# Patient Record
Sex: Female | Born: 1950 | Race: White | Hispanic: No
Health system: Southern US, Academic
[De-identification: ages and names within clinical notes are randomized; demographics above are authoritative.]

## PROBLEM LIST (undated history)

## (undated) ENCOUNTER — Encounter

## (undated) ENCOUNTER — Ambulatory Visit

## (undated) ENCOUNTER — Ambulatory Visit: Attending: Internal Medicine | Primary: Internal Medicine

## (undated) ENCOUNTER — Telehealth

## (undated) ENCOUNTER — Encounter: Payer: MEDICARE | Attending: Obstetrics & Gynecology | Primary: Obstetrics & Gynecology

## (undated) ENCOUNTER — Telehealth: Attending: Obstetrics & Gynecology | Primary: Obstetrics & Gynecology

## (undated) ENCOUNTER — Encounter: Payer: MEDICARE | Attending: Nutritionist | Primary: Nutritionist

## (undated) DIAGNOSIS — E079 Disorder of thyroid, unspecified: Secondary | ICD-10-CM

## (undated) DIAGNOSIS — I10 Essential (primary) hypertension: Secondary | ICD-10-CM

## (undated) DIAGNOSIS — M810 Age-related osteoporosis without current pathological fracture: Secondary | ICD-10-CM

## (undated) DIAGNOSIS — M546 Pain in thoracic spine: Secondary | ICD-10-CM

## (undated) DIAGNOSIS — I219 Acute myocardial infarction, unspecified: Secondary | ICD-10-CM

## (undated) DIAGNOSIS — I509 Heart failure, unspecified: Secondary | ICD-10-CM

## (undated) DIAGNOSIS — M1990 Unspecified osteoarthritis, unspecified site: Secondary | ICD-10-CM

## (undated) HISTORY — DX: Age-related osteoporosis without current pathological fracture: M81.0

## (undated) HISTORY — DX: Heart failure, unspecified: I50.9

## (undated) HISTORY — DX: Unspecified osteoarthritis, unspecified site: M19.90

## (undated) HISTORY — DX: Disorder of thyroid, unspecified: E07.9

## (undated) HISTORY — DX: Acute myocardial infarction, unspecified: I21.9

## (undated) HISTORY — DX: Pain in thoracic spine: M54.6

## (undated) HISTORY — DX: Essential (primary) hypertension: I10

---
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
## 1987-08-27 LAB — HM COLONOSCOPY: HM Colonoscopy: NORMAL

## 1999-08-27 HISTORY — PX: LAPAROSCOPIC HYSTERECTOMY: SHX1926

## 1999-11-04 ENCOUNTER — Encounter: Payer: Self-pay | Admitting: Obstetrics and Gynecology

## 1999-11-04 ENCOUNTER — Inpatient Hospital Stay (HOSPITAL_COMMUNITY): Admission: AD | Admit: 1999-11-04 | Discharge: 1999-11-04 | Payer: Self-pay | Admitting: Obstetrics and Gynecology

## 1999-11-10 ENCOUNTER — Ambulatory Visit (HOSPITAL_COMMUNITY): Admission: RE | Admit: 1999-11-10 | Discharge: 1999-11-10 | Payer: Self-pay | Admitting: Obstetrics and Gynecology

## 2000-02-25 ENCOUNTER — Other Ambulatory Visit: Admission: RE | Admit: 2000-02-25 | Discharge: 2000-02-25 | Payer: Self-pay | Admitting: Obstetrics and Gynecology

## 2002-08-26 LAB — HM MAMMOGRAPHY: HM Mammogram: NORMAL

## 2006-05-15 ENCOUNTER — Ambulatory Visit: Payer: Self-pay | Admitting: *Deleted

## 2006-05-19 ENCOUNTER — Ambulatory Visit: Payer: Self-pay | Admitting: *Deleted

## 2006-06-17 ENCOUNTER — Ambulatory Visit: Payer: Self-pay | Admitting: *Deleted

## 2007-02-25 ENCOUNTER — Emergency Department: Payer: Self-pay

## 2007-12-01 ENCOUNTER — Ambulatory Visit: Payer: Self-pay

## 2007-12-07 ENCOUNTER — Ambulatory Visit: Payer: Self-pay

## 2013-08-07 ENCOUNTER — Emergency Department: Payer: Self-pay | Admitting: Emergency Medicine

## 2013-08-07 LAB — BASIC METABOLIC PANEL
Anion Gap: 4 — ABNORMAL LOW (ref 7–16)
BUN: 11 mg/dL (ref 7–18)
Calcium, Total: 9.4 mg/dL (ref 8.5–10.1)
Chloride: 105 mmol/L (ref 98–107)
Co2: 29 mmol/L (ref 21–32)
Creatinine: 0.78 mg/dL (ref 0.60–1.30)
EGFR (African American): >60
EGFR (Non-African Amer.): >60
Glucose: 93 mg/dL (ref 65–99)
Osmolality: 275 (ref 275–301)
Potassium: 3.9 mmol/L (ref 3.5–5.1)
Sodium: 138 mmol/L (ref 136–145)

## 2013-08-07 LAB — CBC
HCT: 39.3 % (ref 35.0–47.0)
HGB: 13.4 g/dL (ref 12.0–16.0)
MCH: 29.1 pg (ref 26.0–34.0)
MCHC: 34 g/dL (ref 32.0–36.0)
MCV: 86 fL (ref 80–100)
Platelet: 285 10*3/uL (ref 150–440)
RBC: 4.6 10*6/uL (ref 3.80–5.20)
RDW: 13.8 % (ref 11.5–14.5)
WBC: 6.9 10*3/uL (ref 3.6–11.0)

## 2013-08-07 LAB — HEPATIC FUNCTION PANEL A (ARMC)
Albumin: 4.3 g/dL (ref 3.4–5.0)
Alkaline Phosphatase: 85 U/L
Bilirubin, Direct: <0.1 mg/dL (ref 0.00–0.20)
Bilirubin,Total: 0.5 mg/dL (ref 0.2–1.0)
SGOT(AST): 21 U/L (ref 15–37)
SGPT (ALT): 21 U/L (ref 12–78)
Total Protein: 8.4 g/dL — ABNORMAL HIGH (ref 6.4–8.2)

## 2013-08-07 LAB — PRO B NATRIURETIC PEPTIDE: B-Type Natriuretic Peptide: 209 pg/mL — ABNORMAL HIGH (ref 0–125)

## 2013-08-07 LAB — TROPONIN I: Troponin-I: <0.02 ng/mL

## 2013-08-07 LAB — LIPASE, BLOOD: Lipase: 312 U/L (ref 73–393)

## 2013-08-11 ENCOUNTER — Ambulatory Visit: Payer: Self-pay | Admitting: Internal Medicine

## 2013-08-11 LAB — CBC WITH DIFFERENTIAL/PLATELET
Basophil #: 0.1 10*3/uL (ref 0.0–0.1)
Basophil %: 0.8 %
Eosinophil #: 0.2 10*3/uL (ref 0.0–0.7)
Eosinophil %: 3.2 %
HCT: 39.6 % (ref 35.0–47.0)
HGB: 13.3 g/dL (ref 12.0–16.0)
Lymphocyte #: 1.2 10*3/uL (ref 1.0–3.6)
Lymphocyte %: 19.8 %
MCH: 28.8 pg (ref 26.0–34.0)
MCHC: 33.7 g/dL (ref 32.0–36.0)
MCV: 85 fL (ref 80–100)
Monocyte #: 0.4 x10 3/mm (ref 0.2–0.9)
Monocyte %: 7.3 %
Neutrophil #: 4.1 10*3/uL (ref 1.4–6.5)
Neutrophil %: 68.9 %
Platelet: 257 10*3/uL (ref 150–440)
RBC: 4.64 10*6/uL (ref 3.80–5.20)
RDW: 13.7 % (ref 11.5–14.5)
WBC: 6 10*3/uL (ref 3.6–11.0)

## 2013-08-11 LAB — BASIC METABOLIC PANEL
Anion Gap: 3 — ABNORMAL LOW (ref 7–16)
BUN: 9 mg/dL (ref 7–18)
Calcium, Total: 9.4 mg/dL (ref 8.5–10.1)
Chloride: 106 mmol/L (ref 98–107)
Co2: 30 mmol/L (ref 21–32)
Creatinine: 0.7 mg/dL (ref 0.60–1.30)
EGFR (African American): >60
EGFR (Non-African Amer.): >60
Glucose: 92 mg/dL (ref 65–99)
Osmolality: 276 (ref 275–301)
Potassium: 3.9 mmol/L (ref 3.5–5.1)
Sodium: 139 mmol/L (ref 136–145)

## 2013-08-24 ENCOUNTER — Ambulatory Visit: Payer: Self-pay | Admitting: Internal Medicine

## 2014-04-12 ENCOUNTER — Ambulatory Visit: Payer: Self-pay | Admitting: Internal Medicine

## 2014-05-30 DIAGNOSIS — N819 Female genital prolapse, unspecified: Secondary | ICD-10-CM | POA: Insufficient documentation

## 2014-05-30 DIAGNOSIS — N8110 Cystocele, unspecified: Secondary | ICD-10-CM | POA: Insufficient documentation

## 2014-07-12 DIAGNOSIS — I5022 Chronic systolic (congestive) heart failure: Secondary | ICD-10-CM | POA: Insufficient documentation

## 2014-10-31 LAB — TSH: TSH: 4.48 u[IU]/mL (ref <=5.90)

## 2014-10-31 LAB — BASIC METABOLIC PANEL
BUN: 10 mg/dL (ref 4–21)
Creatinine: 0.7 mg/dL (ref <=1.1)

## 2014-11-09 ENCOUNTER — Ambulatory Visit: Payer: Self-pay | Admitting: Internal Medicine

## 2014-12-14 DIAGNOSIS — M359 Systemic involvement of connective tissue, unspecified: Secondary | ICD-10-CM | POA: Insufficient documentation

## 2015-01-11 DIAGNOSIS — M069 Rheumatoid arthritis, unspecified: Secondary | ICD-10-CM | POA: Insufficient documentation

## 2015-01-11 DIAGNOSIS — R0789 Other chest pain: Secondary | ICD-10-CM | POA: Insufficient documentation

## 2015-01-11 DIAGNOSIS — I10 Essential (primary) hypertension: Secondary | ICD-10-CM | POA: Insufficient documentation

## 2015-01-27 ENCOUNTER — Encounter: Payer: Self-pay | Admitting: Internal Medicine

## 2015-01-27 ENCOUNTER — Ambulatory Visit (INDEPENDENT_AMBULATORY_CARE_PROVIDER_SITE_OTHER): Payer: BLUE CROSS/BLUE SHIELD | Admitting: Internal Medicine

## 2015-01-27 ENCOUNTER — Other Ambulatory Visit: Payer: Self-pay | Admitting: Rheumatology

## 2015-01-27 VITALS — BP 152/82 | HR 76 | Ht 62.5 in | Wt 113.5 lb

## 2015-01-27 DIAGNOSIS — L209 Atopic dermatitis, unspecified: Secondary | ICD-10-CM | POA: Diagnosis not present

## 2015-01-27 DIAGNOSIS — E785 Hyperlipidemia, unspecified: Secondary | ICD-10-CM | POA: Insufficient documentation

## 2015-01-27 DIAGNOSIS — I7300 Raynaud's syndrome without gangrene: Secondary | ICD-10-CM | POA: Insufficient documentation

## 2015-01-27 DIAGNOSIS — R1310 Dysphagia, unspecified: Secondary | ICD-10-CM

## 2015-01-27 MED ORDER — CETIRIZINE HCL 10 MG PO TABS: 10.0000 mg | ORAL_TABLET | Freq: Every day | ORAL | Status: DC

## 2015-01-27 MED ORDER — RANITIDINE HCL 150 MG PO TABS: 150.0000 mg | ORAL_TABLET | Freq: Two times a day (BID) | ORAL | Status: DC

## 2015-01-27 NOTE — Progress Notes (Signed)
Date:  01/27/2015   Name:  Katherine Baird   DOB:  10/08/1950   MRN:  [id number]   Chief Complaint: Rash  History of Present Illness:  This is a 64 y.o. female who is presenting with a rash.  The problem started as swelling and redness in right foot and leg.  The redness moved to left leg and foot and last evening moved to face.  The rash is itchy and painful.  Today she has symptoms on both arms as well.  She has known atopic dermatitis and rheumatoid arthritis.  She has TAC cream to use as needed.  Review of Systems:  Review of Systems  Respiratory: Negative for shortness of breath and wheezing.   Cardiovascular: Positive for leg swelling. Negative for chest pain.  Skin: Positive for color change and rash.       And nail changes    Patient Active Problem List   Diagnosis Date Noted  . HLD (hyperlipidemia) 01/27/2015  . Paroxysmal digital cyanosis 01/27/2015  . 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 Acquired hypothyroidism 01/11/2015  . AD (atopic dermatitis) 01/11/2015  . Chest wall pain 01/11/2015  . 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 Essential (primary) hypertension 01/11/2015  . Arthritis, degenerative 01/11/2015  . Rheumatoid arthritis involving multiple joints 01/11/2015  . Collagen disease 12/14/2014  . Chronic systolic heart failure 07/12/2014  . 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 Authorizing Provider  carvedilol (COREG) 6.25 MG tablet Take 1 tablet by mouth 2 (two) times daily.   Yes Historical Provider, MD  levothyroxine (SYNTHROID, LEVOTHROID) 25 MCG tablet Take 1 tablet by mouth daily.   Yes Historical Provider, MD  pseudoephedrine (SUDAFED) 120 MG 12 hr tablet Take 120 tablets by mouth once as needed. 02/16/08  Yes Historical Provider, MD    Allergies  Allergen Reactions  . Doxycycline   . Erythromycin   .  Gabapentin   . Guaifenesin     "Felt sick" and worsening of symptoms  . Nitrofuran Derivatives   . Prednisone     muscle weakness/pain  . Sulfa Antibiotics   . Tetracycline   . Azithromycin Rash    Severe rash with peeling    Past Surgical History  Procedure Laterality Date  . Laparoscopic hysterectomy  2001    History  Substance Use Topics  . Smoking status: Never Smoker   . Smokeless tobacco: Not on file  . Alcohol Use: No     Medication list has been reviewed and updated.  Physical Examination:  Physical Exam  Constitutional: She appears well-developed and well-nourished. No distress.  Cardiovascular: Normal rate.   Pulmonary/Chest: Effort normal and breath sounds normal. She has no wheezes. She has no rales.  Skin: Skin is warm and dry. Rash noted. Rash is urticarial. There is erythema.  Red macular lesions scattered over both legs, arms and face c/w atopic dermatitis.    BP 152/82 mmHg  Pulse 76  Ht 5' 2.5" (1.588 m)  Wt 113 lb 8 oz (51.483 kg)  BMI 20.42 kg/m2  Assessment and Plan: 1. AD (atopic dermatitis) Severe - possible related to RA Begin Zantac and Zyrtec daily Pt intolerant to oral prednisone Needs to establish with another Dermatologist   Bari Edward, MD [facility]  01/27/2015

## 2015-02-06 ENCOUNTER — Ambulatory Visit: Payer: Self-pay

## 2015-02-17 ENCOUNTER — Other Ambulatory Visit: Payer: Self-pay | Admitting: Internal Medicine

## 2015-02-17 MED ORDER — CETIRIZINE HCL 10 MG PO TABS: 10.0000 mg | ORAL_TABLET | Freq: Every day | ORAL | Status: DC

## 2015-02-23 DIAGNOSIS — L309 Dermatitis, unspecified: Secondary | ICD-10-CM | POA: Insufficient documentation

## 2015-06-08 ENCOUNTER — Other Ambulatory Visit: Payer: Self-pay | Admitting: Internal Medicine

## 2015-06-08 MED ORDER — LEVOTHYROXINE SODIUM 25 MCG PO TABS: 25.0000 ug | ORAL_TABLET | Freq: Every day | ORAL | Status: DC

## 2015-06-28 ENCOUNTER — Other Ambulatory Visit: Payer: Self-pay | Admitting: Internal Medicine

## 2015-06-28 ENCOUNTER — Encounter: Payer: Self-pay | Admitting: Internal Medicine

## 2015-06-28 ENCOUNTER — Ambulatory Visit (INDEPENDENT_AMBULATORY_CARE_PROVIDER_SITE_OTHER): Payer: BLUE CROSS/BLUE SHIELD | Admitting: Internal Medicine

## 2015-06-28 VITALS — BP 144/76 | HR 68 | Ht 60.0 in | Wt 109.0 lb

## 2015-06-28 DIAGNOSIS — I420 Dilated cardiomyopathy: Secondary | ICD-10-CM | POA: Diagnosis not present

## 2015-06-28 DIAGNOSIS — I10 Essential (primary) hypertension: Secondary | ICD-10-CM | POA: Diagnosis not present

## 2015-06-28 DIAGNOSIS — I5022 Chronic systolic (congestive) heart failure: Secondary | ICD-10-CM

## 2015-06-28 DIAGNOSIS — M359 Systemic involvement of connective tissue, unspecified: Secondary | ICD-10-CM

## 2015-06-28 DIAGNOSIS — E039 Hypothyroidism, unspecified: Secondary | ICD-10-CM | POA: Diagnosis not present

## 2015-06-28 MED ORDER — LEVOTHYROXINE SODIUM 25 MCG PO TABS: 25.0000 ug | ORAL_TABLET | Freq: Every day | ORAL | Status: DC

## 2015-06-28 NOTE — Progress Notes (Signed)
Date:  06/28/2015   Name:  Katherine Baird   DOB:  06/09/1951   MRN:  [id number]   Chief Complaint: Hypothyroidism Thyroid Problem Presents for follow-up visit. Symptoms include constipation and fatigue. Patient reports no anxiety, cold intolerance, diaphoresis, hair loss, hoarse voice, leg swelling or palpitations. The symptoms have been stable. Past treatments include levothyroxine. The treatment provided significant relief.  Cough This is a recurrent problem. The problem has been waxing and waning. Episode frequency: later in the day - dry cough. The cough is non-productive. Associated symptoms include a rash (nail changes). Pertinent negatives include no chest pain, fever, headaches or sore throat. The symptoms are aggravated by pollens. Treatments tried: decongestant in the morning lasts until the evening. The treatment provided moderate relief.   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 Her medications are being adjusted to find something that she tolerates. Currently on Plaquenil. So far there is been no significant benefit to symptoms. Cardiomyopathy/hypertension - Patient is being followed by cardiology Dr. Gwen Pounds. Her blood pressures have been up and down. Often higher in the doctor's office. Last month her Coreg was increased from once a day to twice a day.  Review of Systems  Constitutional: Positive for fatigue. Negative for fever and diaphoresis.  HENT: Negative for hearing loss, hoarse voice, sore throat, trouble swallowing and voice change.   Eyes: Negative for visual disturbance.  Respiratory: Positive for cough.   Cardiovascular: Positive for leg swelling. Negative for chest pain and palpitations.  Gastrointestinal: Positive for constipation. Negative for abdominal pain, abdominal distention and anal bleeding.  Endocrine: Negative for cold intolerance.  Musculoskeletal: Positive for arthralgias.  Skin:  Positive for rash (nail changes).  Neurological: Negative for syncope and headaches.  Psychiatric/Behavioral: The patient is not nervous/anxious.     Patient Active Problem List   Diagnosis Date Noted  . Dermatitis, eczematoid 02/23/2015  . HLD (hyperlipidemia) 01/27/2015  . Paroxysmal digital cyanosis 01/27/2015  . 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 Acquired hypothyroidism 01/11/2015  . AD (atopic dermatitis) 01/11/2015  . Chest wall pain 01/11/2015  . Cardiomyopathy, dilated (HCC) 01/11/2015  . Essential (primary) hypertension 01/11/2015  . Arthritis, degenerative 01/11/2015  . Rheumatoid arthritis involving multiple joints (HCC) 01/11/2015  . Collagen disease (HCC) 12/14/2014  . Chronic systolic heart failure (HCC) 07/12/2014  . 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 Authorizing Provider  carvedilol (COREG) 6.25 MG tablet Take 1 tablet by mouth 2 (two) times daily.   Yes Historical Provider, MD  clobetasol ointment (TEMOVATE) 0.05 %  04/27/15  Yes Historical Provider, MD  hydroxychloroquine (PLAQUENIL) 200 MG tablet Take 1 tablet by mouth daily. 06/13/15  Yes Historical Provider, MD  levothyroxine (SYNTHROID, LEVOTHROID) 25 MCG tablet Take 1 tablet (25 mcg total) by mouth daily. 06/08/15  Yes Reubin Milan, MD  pseudoephedrine (SUDAFED) 120 MG 12 hr tablet Take 120 tablets by mouth once as needed. 02/16/08  Yes Historical Provider, MD    Allergies  Allergen Reactions  . Doxycycline   . Erythromycin   . Gabapentin   . Guaifenesin     "Felt sick" and worsening of symptoms  . Nitrofuran Derivatives   . Prednisone     muscle weakness/pain  . Sulfa Antibiotics   . Tetracycline   . Azithromycin Rash    Severe rash with peeling  .  Carvedilol Other (See Comments)    Dose problem with higher doses    Past Surgical  History  Procedure Laterality Date  . Laparoscopic hysterectomy  2001    total    Social History  Substance Use Topics  . Smoking status: Never Smoker   . Smokeless tobacco: None  . Alcohol Use: No    Medication list has been reviewed and updated.   Physical Exam  Constitutional: She is oriented to person, place, and time. She appears well-developed.  Neck: Normal range of motion. Neck supple. No thyromegaly present.  Cardiovascular: Normal rate, regular rhythm and normal heart sounds.   Pulmonary/Chest: Effort normal and breath sounds normal. She has no wheezes.  Musculoskeletal: She exhibits edema.  No active joint inflammation noted today  Lymphadenopathy:    She has no cervical adenopathy.  Neurological: She is alert and oriented to person, place, and time.  Skin:  Generalized xerosis nail changes on 9 of 10 fingernails  Psychiatric: She has a normal mood and affect. Her behavior is normal.  Nursing note and vitals reviewed.   BP 144/76 mmHg  Pulse 68  Ht 5' (1.524 m)  Wt 109 lb (49.442 kg)  BMI 21.29 kg/m2  Assessment and Plan: 1. Acquired hypothyroidism Continue current medications and will advise on dose change if needed - levothyroxine (SYNTHROID, LEVOTHROID) 25 MCG tablet; Take 1 tablet (25 mcg total) by mouth daily.  Dispense: 90 tablet; Refill: 3 - TSH  2. Cardiomyopathy, dilated (HCC) Stable symptoms with mild chronic fatigue followed by cardiology  3. Essential (primary) hypertension Medications recently adjusted We will follow-up on next visit  4. Chronic systolic heart failure (HCC) Currently compensated - follow-up with cardiology  5. Connective tissue disease, undifferentiated (HCC) Treated by rheumatology - currently trying different medications to get symptoms under control   Bari Edward, MD [facility]  06/28/2015

## 2015-06-29 LAB — TSH: TSH: 3.44 u[IU]/mL (ref 0.450–4.500)

## 2015-07-31 ENCOUNTER — Emergency Department: Payer: BLUE CROSS/BLUE SHIELD

## 2015-07-31 ENCOUNTER — Emergency Department
Admission: EM | Admit: 2015-07-31 | Discharge: 2015-07-31 | Disposition: A | Discharge status: Home / Self Care | Payer: BLUE CROSS/BLUE SHIELD | Attending: Emergency Medicine | Admitting: Emergency Medicine

## 2015-07-31 DIAGNOSIS — Z79899 Other long term (current) drug therapy: Secondary | ICD-10-CM | POA: Diagnosis not present

## 2015-07-31 DIAGNOSIS — S79911A Unspecified injury of right hip, initial encounter: Secondary | ICD-10-CM | POA: Diagnosis not present

## 2015-07-31 DIAGNOSIS — W01198A Fall on same level from slipping, tripping and stumbling with subsequent striking against other object, initial encounter: Secondary | ICD-10-CM | POA: Insufficient documentation

## 2015-07-31 DIAGNOSIS — S0990XA Unspecified injury of head, initial encounter: Secondary | ICD-10-CM | POA: Insufficient documentation

## 2015-07-31 DIAGNOSIS — S4991XA Unspecified injury of right shoulder and upper arm, initial encounter: Secondary | ICD-10-CM | POA: Diagnosis present

## 2015-07-31 DIAGNOSIS — Y998 Other external cause status: Secondary | ICD-10-CM | POA: Diagnosis not present

## 2015-07-31 DIAGNOSIS — Y9289 Other specified places as the place of occurrence of the external cause: Secondary | ICD-10-CM | POA: Diagnosis not present

## 2015-07-31 DIAGNOSIS — S42201A Unspecified fracture of upper end of right humerus, initial encounter for closed fracture: Secondary | ICD-10-CM | POA: Diagnosis not present

## 2015-07-31 DIAGNOSIS — Y9389 Activity, other specified: Secondary | ICD-10-CM | POA: Insufficient documentation

## 2015-07-31 DIAGNOSIS — M25551 Pain in right hip: Secondary | ICD-10-CM

## 2015-07-31 DIAGNOSIS — S42292A Other displaced fracture of upper end of left humerus, initial encounter for closed fracture: Secondary | ICD-10-CM

## 2015-07-31 DIAGNOSIS — W19XXXA Unspecified fall, initial encounter: Secondary | ICD-10-CM

## 2015-07-31 MED ORDER — SODIUM CHLORIDE 0.9 % IV BOLUS (SEPSIS)
1000.0000 mL | Freq: Once | INTRAVENOUS | Status: AC
  Administered 2015-07-31: 1000 mL via INTRAVENOUS

## 2015-07-31 MED ORDER — OXYCODONE-ACETAMINOPHEN 5-325 MG PO TABS: 1.0000 | ORAL_TABLET | Freq: Four times a day (QID) | ORAL | Status: DC | PRN

## 2015-07-31 MED ORDER — FENTANYL CITRATE (PF) 100 MCG/2ML IJ SOLN
50.0000 ug | Freq: Once | INTRAMUSCULAR | Status: AC
  Administered 2015-07-31: 50 ug via INTRAVENOUS
  Filled 2015-07-31: qty 2

## 2015-07-31 MED ORDER — ONDANSETRON HCL 4 MG/2ML IJ SOLN
4.0000 mg | Freq: Once | INTRAMUSCULAR | Status: AC
  Administered 2015-07-31: 4 mg via INTRAVENOUS
  Filled 2015-07-31: qty 2

## 2015-07-31 MED ORDER — OXYCODONE-ACETAMINOPHEN 5-325 MG PO TABS
1.0000 | ORAL_TABLET | Freq: Once | ORAL | Status: AC
  Administered 2015-07-31: 1 via ORAL
  Filled 2015-07-31: qty 1

## 2015-07-31 NOTE — ED Notes (Addendum)
According to EMS, pt went to Urgent Care today to be seen for Right Shoulder pain after falling this afternoon. Upon the PA-C assessment and diagnostic radiology exam the pt has a "Right Proximal Humerus Fracture". Pt arrives to [facility] ED, guarding her right arm, speaking in complete sentences, A+OX4, nauseated and reporting 5/10 Right Arm Pain. Pt reports that she takes Aspirin for arthritis.   EMS Vitals  BP 157/79 HR 76 RR 16  EMS administered 100 mcg of Fentanyl IV.

## 2015-07-31 NOTE — ED Provider Notes (Addendum)
[facility] Emergency Department Provider Note  ____________________________________________  Time seen: Approximately 6:52 PM  I have reviewed the triage vital signs and the nursing notes.   HISTORY  Chief Complaint Arm Injury and Fall    HPI Katherine Baird is a 64 y.o.RH female sent from urgent care for a closed right humeral head fracture sure after tripping over her cat. The patient reports that she was at home when she fell over her cat landing on her right shoulder and right hip, and hitting her head.She denies LOC, and after few minutes was able to get up and ambulate on her own to call for help. Patient denies any headache, blurred vision, numbness or tingling. She does have some nausea which started after she was given fentanyl for her pain by EMS.   No past medical history on file.  Patient Active Problem List   Diagnosis Date Noted  . HLD (hyperlipidemia) 01/27/2015  . Paroxysmal digital cyanosis 01/27/2015  . 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 Acquired hypothyroidism 01/11/2015  . AD (atopic dermatitis) 01/11/2015  . Chest wall pain 01/11/2015  . Cardiomyopathy, dilated (HCC) 01/11/2015  . Essential (primary) hypertension 01/11/2015  . Arthritis, degenerative 01/11/2015  . Rheumatoid arthritis involving multiple joints (HCC) 01/11/2015  . Collagen disease (HCC) 12/14/2014  . Chronic systolic heart failure (HCC) 07/12/2014  . 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 Laparoscopic hysterectomy  2001    total    Current Outpatient Rx  Name  Route  Sig  Dispense  Refill  . carvedilol (COREG) 6.25 MG tablet   Oral   Take 1 tablet by mouth 2 (two) times daily.         . clobetasol ointment (TEMOVATE) 0.05 %            0   . hydroxychloroquine (PLAQUENIL) 200 MG tablet   Oral   Take 1 tablet by  mouth daily.      0   . levothyroxine (SYNTHROID, LEVOTHROID) 25 MCG tablet   Oral   Take 1 tablet (25 mcg total) by mouth daily.   90 tablet   3   . oxyCODONE-acetaminophen (ROXICET) 5-325 MG tablet   Oral   Take 1 tablet by mouth every 6 (six) hours as needed.   20 tablet   0   . pseudoephedrine (SUDAFED) 120 MG 12 hr tablet   Oral   Take 120 tablets by mouth once as needed.           Allergies Doxycycline; Erythromycin; Gabapentin; Guaifenesin; Nitrofuran derivatives; Prednisone; Sulfa antibiotics; Tetracycline; Azithromycin; and Carvedilol  Family History  Problem Relation Age of Onset  . Arthritis Mother   . Dementia Mother   . Heart disease Father   . Mesothelioma Father   . Kidney cancer Sister   . Heart disease Sister   . COPD Brother   . Arthritis Brother     Social History Social History  Substance Use Topics  . Smoking status: Never Smoker   . Smokeless tobacco: Not on file  . Alcohol Use: No    Review of Systems Constitutional: No fever/chills area and no lightheadedness or loss of consciousness. Positive fall. Eyes: No visual changes. No blurred or double vision. ENT: No sore throat. Cardiovascular: Denies chest pain, palpitations. Respiratory: Denies shortness of breath.  No cough. Gastrointestinal: No abdominal pain.  No nausea, no  vomiting.  No diarrhea.  No constipation. Genitourinary: Negative for dysuria. Musculoskeletal: Negative for back pain. No neck pain. Positive right shoulder pain. Positive right hip pain but able to ambulate. Skin: Negative for rash. Neurological: Negative for headaches, focal weakness or numbness.  10-point ROS otherwise negative.  ____________________________________________   PHYSICAL EXAM:  VITAL SIGNS: ED Triage Vitals  Enc Vitals Group     BP --      Pulse --      Resp --      Temp --      Temp src --      SpO2 --      Weight --      Height --      Head Cir --      Peak Flow --      Pain  Score 07/31/15 1838 5     Pain Loc --      Pain Edu? --      Excl. in GC? --     Constitutional: Patient is alert and oriented and answering questions appropriately. She is sitting hunched over in the stretcher uncomfortable but not in any acute distress.  Eyes: Conjunctivae are normal.  EOMI. Head: Atraumatic. No raccoon eyes or Battle sign.  Nose: No congestion/rhinnorhea. Mouth/Throat: Mucous membranes are moist. No dental injury or malocclusion. Neck: No stridor.  Supple.  No midline C-spine tenderness to palpation, no step-offs or deformities. Cardiovascular: Normal rate, regular rhythm. No murmurs, rubs or gallops.  Respiratory: Normal respiratory effort.  No retractions. Lungs CTAB.  No wheezes, rales or ronchi. Gastrointestinal: Soft and nontender. No distention. No peritoneal signs. Musculoskeletal: No T or L-spine tenderness to palpation, step-offs or deformities. Patient has significant swelling over the right upper arm. She has normal right radial pulse. Normal and full range of motion without pain in the right wrist. She has some pain with range of motion of the right elbow that radiates into her shoulder. Normal sensation to light touch throughout the right upper extremity. Patient has full range of motion without pain in the bilateral hips right knee and right ankle. Our mode DP and PT pulse. Normal sensation to light touch in the right lower extremity. Neurologic:  Normal speech and language. No gross focal neurologic deficits are appreciated.  Skin:  Skin is warm, dry and intact. No rash noted. Psychiatric: Mood and affect are normal. Speech and behavior are normal.  Normal judgement.  ____________________________________________   LABS (all labs ordered are listed, but only abnormal results are displayed)  Labs Reviewed - No data to display ____________________________________________  EKG  ED ECG REPORT I, Rockne Menghini, the attending physician, personally  viewed and interpreted this ECG.   Date: 07/31/2015  EKG Time: 1854  Rate: 72  Rhythm: normal sinus rhythm  Axis: Leftward  Intervals:none  ST&T Change: Nonspecific T-wave inversions in V1. No ST elevation.  ____________________________________________  RADIOLOGY  Dg Shoulder Right  07/31/2015  CLINICAL DATA:  Patient went to Urgent Care today to be seen for Right Shoulder pain after falling this afternoon. Upon the PA-C assessment and diagnostic radiology exam the pt has a "Right Proximal Humerus Fracture". Pt arrives to [facility] ED, guar.*comment was truncated* EXAM: RIGHT SHOULDER - 2+ VIEW COMPARISON:  None. FINDINGS: There is a minimally comminuted fracture across the surgical neck of the humerus, with mild impaction and angulation. No dislocation. IMPRESSION: 1. Transverse minimally comminuted fracture, surgical neck right humerus, with impaction and angulation. Electronically Signed   By: Corlis Leak  M.D.   On: 07/31/2015 19:36   Ct Head Wo Contrast  07/31/2015  CLINICAL DATA:  Status post fall, right arm pain EXAM: CT HEAD WITHOUT CONTRAST TECHNIQUE: Contiguous axial images were obtained from the base of the skull through the vertex without intravenous contrast. COMPARISON:  None. FINDINGS: No evidence of parenchymal hemorrhage or extra-axial fluid collection. No mass lesion, mass effect, or midline shift. No CT evidence of acute infarction. Subcortical white matter and periventricular small vessel ischemic changes. Cerebral volume is within normal limits.  No ventriculomegaly. The visualized paranasal sinuses are essentially clear. The mastoid air cells are unopacified. No evidence of calvarial fracture. IMPRESSION: No evidence of acute intracranial abnormality. Small vessel ischemic changes. Electronically Signed   By: Charline Bills M.D.   On: 07/31/2015 19:23   Dg Hip Unilat With Pelvis 2-3 Views Right  07/31/2015  CLINICAL DATA:  Status post fall, with right hip pain. Initial  encounter. EXAM: DG HIP (WITH OR WITHOUT PELVIS) 2-3V RIGHT COMPARISON:  None. FINDINGS: There is no evidence of fracture or dislocation. Both femoral heads are seated normally within their respective acetabula. The proximal right femur appears intact. No significant degenerative change is appreciated. The sacroiliac joints are unremarkable in appearance. The visualized bowel gas pattern is grossly unremarkable in appearance. IMPRESSION: No evidence of fracture or dislocation. Electronically Signed   By: Roanna Raider M.D.   On: 07/31/2015 19:40    ____________________________________________   PROCEDURES  Procedure(s) performed: None  Critical Care performed: No ____________________________________________   INITIAL IMPRESSION / ASSESSMENT AND PLAN / ED COURSE  Pertinent labs & imaging results that were available during my care of the patient were reviewed by me and considered in my medical decision making (see chart for details).  64 y.o. female status post mechanical fall with known right humeral head fracture, hit head, right hip pain. I will evaluate her for any intracranial injury as well as right hip injury although she is able to bear weight and has full range of motion without pain at this time so fracture is much less likely. She is neurovascularly intact in the right upper extremity.  ----------------------------------------- 7:51 PM on 07/31/2015 -----------------------------------------  Patient CT head and right hip x-rays are negative for any acute process. She does have a right humeral head fracture site have paged the orthopedist on call.  ----------------------------------------- 8:18 PM on 07/31/2015 -----------------------------------------  The patient's pain has been well controlled with fentanyl here. I'll give her Percocet before she goes for more long-acting in regimen. The treatment for her humeral fracture will be an immobilizer and follow-up in 2-3 days  with Dr. Hyacinth Meeker. I've spoken with Dr. Hyacinth Meeker who will anticipate her call for an appointment in his clinic. The patient understands immobilizer use, return precautions and follow-up instructions.  ____________________________________________  FINAL CLINICAL IMPRESSION(S) / ED DIAGNOSES  Final diagnoses:  Humeral head fracture, left, closed, initial encounter  Fall, initial encounter  Right hip pain      NEW MEDICATIONS STARTED DURING THIS VISIT:  New Prescriptions   OXYCODONE-ACETAMINOPHEN (ROXICET) 5-325 MG TABLET    Take 1 tablet by mouth every 6 (six) hours as needed.     Rockne Menghini, MD 07/31/15 2018  Rockne Menghini, MD 07/31/15 2025

## 2015-07-31 NOTE — Discharge Instructions (Signed)
Please call to make an appointment for follow-up with Dr. Hyacinth Meeker. Please keep your shoulder immobilizer on at all times until you are cleared by Dr. Hyacinth Meeker to remove it.  Please return to the emergency department for severe pain, numbness tingling or weakness, or any other symptoms concerning to you.

## 2015-08-04 ENCOUNTER — Ambulatory Visit: Payer: BLUE CROSS/BLUE SHIELD | Admitting: Occupational Therapy

## 2015-08-15 ENCOUNTER — Other Ambulatory Visit: Payer: Self-pay | Admitting: Internal Medicine

## 2015-08-15 ENCOUNTER — Ambulatory Visit (INDEPENDENT_AMBULATORY_CARE_PROVIDER_SITE_OTHER): Payer: BLUE CROSS/BLUE SHIELD | Admitting: Internal Medicine

## 2015-08-15 ENCOUNTER — Encounter: Payer: Self-pay | Admitting: Internal Medicine

## 2015-08-15 ENCOUNTER — Ambulatory Visit
Admission: RE | Admit: 2015-08-15 | Discharge: 2015-08-15 | Disposition: A | Discharge status: Home / Self Care | Payer: BLUE CROSS/BLUE SHIELD | Source: Ambulatory Visit | Attending: Internal Medicine | Admitting: Internal Medicine

## 2015-08-15 VITALS — BP 164/80 | HR 76 | Ht 62.0 in | Wt 108.8 lb

## 2015-08-15 DIAGNOSIS — M7989 Other specified soft tissue disorders: Secondary | ICD-10-CM

## 2015-08-15 DIAGNOSIS — S42211A Unspecified displaced fracture of surgical neck of right humerus, initial encounter for closed fracture: Secondary | ICD-10-CM

## 2015-08-15 NOTE — Progress Notes (Signed)
Date:  08/15/2015   Name:  Katherine Baird   DOB:  04/29/1951   MRN:  [id number]   Chief Complaint: Altered Mental Status She fell and broke her arm about two weeks ago.  Fracture at the surgical neck of the right humerus.  She has been seen by Orthopedics twice and is in a sling.  She is not sure what her limitations are to allow the fracture to heal. She is also having pain in her elbow and wrist.  She reports trying to use her arm and shoulder.  She does not remember much about falling but thinks that the cat tripped her. She may have passed out for a few seconds.  Head CT done in the ER was negative.  She has also noted some swelling and pain in her right calf that started yesterday.  She noted some redness today.  She does not think that there was any trauma at the time of her fall.  Review of Systems  Constitutional: Negative for fever, chills and fatigue.  Respiratory: Negative for cough, chest tightness and shortness of breath.   Cardiovascular: Positive for leg swelling. Negative for chest pain and palpitations.  Gastrointestinal: Negative for abdominal pain.  Musculoskeletal: Positive for myalgias, arthralgias and neck stiffness. Negative for neck pain.  Skin: Negative for wound.    Patient Active Problem List   Diagnosis Date Noted  . HLD (hyperlipidemia) 01/27/2015  . Paroxysmal digital cyanosis 01/27/2015  . 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 Acquired hypothyroidism 01/11/2015  . AD (atopic dermatitis) 01/11/2015  . Chest wall pain 01/11/2015  . Cardiomyopathy, dilated (HCC) 01/11/2015  . Essential (primary) hypertension 01/11/2015  . Arthritis, degenerative 01/11/2015  . Rheumatoid arthritis involving multiple joints (HCC) 01/11/2015  . Diffuse connective tissue disease (HCC) 12/14/2014  . Chronic systolic heart failure (HCC) 07/12/2014  . 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 Authorizing Provider  carvedilol (COREG) 6.25 MG tablet Take 1 tablet by mouth 2 (two) times daily.   Yes Historical Provider, MD  clobetasol ointment (TEMOVATE) 0.05 %  04/27/15  Yes Historical Provider, MD  hydroxychloroquine (PLAQUENIL) 200 MG tablet Take 1 tablet by mouth daily. 06/13/15  Yes Historical Provider, MD  levothyroxine (SYNTHROID, LEVOTHROID) 25 MCG tablet Take 1 tablet (25 mcg total) by mouth daily. 06/28/15  Yes Reubin Milan, MD  pseudoephedrine (SUDAFED) 120 MG 12 hr tablet Take 120 tablets by mouth once as needed. 02/16/08  Yes Historical Provider, MD  traMADol (ULTRAM) 50 MG tablet Take 1 tablet by mouth 4 (four) times daily as needed. 08/11/15  Yes Historical Provider, MD    Allergies  Allergen Reactions  . Doxycycline   . Erythromycin   . Gabapentin   . Guaifenesin     "Felt sick" and worsening of symptoms  . Nitrofuran Derivatives   . Prednisone     muscle weakness/pain  . Sulfa Antibiotics   . Tetracycline   . Azithromycin Rash    Severe rash with peeling  . Carvedilol Other (See Comments)    Dose problem with higher doses    Past Surgical History  Procedure Laterality Date  . Laparoscopic hysterectomy  2001    total    Social History  Substance Use Topics  . Smoking status: Never Smoker   . Smokeless tobacco: None  . Alcohol Use: No     Medication list has  been reviewed and updated.   Physical Exam  Constitutional: She appears well-developed and well-nourished.  Cardiovascular: Normal rate, regular rhythm and normal heart sounds.   Pulmonary/Chest: Breath sounds normal.  Musculoskeletal:       Right elbow: She exhibits no swelling. Tenderness (medial proximal forearm) found.       Right wrist: Normal. She exhibits normal range of motion.       Arms:      Right lower leg: She exhibits tenderness (and warm to touch; circumference 1/2 inch greater than left), swelling and edema.  Right grip is  normal.     BP 164/80 mmHg  Pulse 76  Ht 5\' 2"  (1.575 m)  Wt 108 lb 12.8 oz (49.351 kg)  BMI 19.89 kg/m2  Assessment and Plan: 1. Humerus surgical neck fracture, right, closed, initial encounter Pt instructed to keep right arm in sling at all time.  She can move her elbow and wrist every few hours. Continue Tramadol as needed for pain Follow up with Ortho for xrays to document healing  2. Right leg swelling Concern for DVT ( call report negative) - US Venous Img Lower Unilateral Right; Future   [place], MD [facility]  08/15/2015

## 2015-10-04 ENCOUNTER — Encounter: Payer: BLUE CROSS/BLUE SHIELD | Admitting: Internal Medicine

## 2015-10-05 ENCOUNTER — Ambulatory Visit: Payer: BLUE CROSS/BLUE SHIELD | Admitting: Physical Therapy

## 2015-10-10 ENCOUNTER — Ambulatory Visit: Payer: BLUE CROSS/BLUE SHIELD | Attending: Specialist | Admitting: Physical Therapy

## 2015-10-10 DIAGNOSIS — M25611 Stiffness of right shoulder, not elsewhere classified: Secondary | ICD-10-CM | POA: Diagnosis present

## 2015-10-10 DIAGNOSIS — M25511 Pain in right shoulder: Secondary | ICD-10-CM | POA: Diagnosis present

## 2015-10-10 DIAGNOSIS — M6281 Muscle weakness (generalized): Secondary | ICD-10-CM | POA: Insufficient documentation

## 2015-10-11 NOTE — Therapy (Addendum)
[facility] [address] [place], [place], [address] Phone: [phone]   Fax:  [phone]  Physical Therapy Evaluation  Patient Details  Name: Katherine Baird MRN: [id number] Date of Birth: Apr 09, 1951 Referring Provider: Dr. Hyacinth Meeker  Encounter Date: 10/10/2015    History reviewed. No pertinent past medical history.  Past Surgical History  Procedure Laterality Date  . Laparoscopic hysterectomy  2001    total    There were no vitals filed for this visit.  Visit Diagnosis:  Right shoulder pain  Stiffness of right shoulder joint  Muscle weakness    Pt. reports 5/10 R shoulder pain at best and >8/10 at worst (with movement/ reaching).       Pt. is a 65 y/o female with R closed fracture of upper end of humerus after a fall on 08/10/15.  Pt. reports 5/10 R shoulder pain at rest/best and >8/10 with reaching/ use of R shoulder with daily tasks.  Pt. reports difficulty using R UE while starting car in ignition and lifting/ hanging clothes.  Pt. currently taking car of mother with dementia on a daily basis.  QuickDASH: 68.2%.  Prior to R shoulder AROM pt. reports 8/10 pain at rest into elbow.  Supine L/R shoulder AROM: flexion (140/90 deg.), abd. (140 deg./ 78 deg.), ER (85 deg./ 40 deg.)- pain limited.  Hypomobility noted with all planes of R shoulder capsular mobility as compared to L shoulder.  L shoulder strength 4/5 MMT except biceps/ triceps 5/5 MMT.  R shoulder strength grossly 3+/5 MMT and biceps/ triceps 4+/5 MMT (pain in R elbow).  Grip strength: R 30.7#, L 29.1#.   Key grip: 7#.  3-jaw grip: 4#.  Pt. is tender with palpation to R proximal biceps/ deltoid region.  R upper arm light bruising noted.  Pt. will benefit from skilled PT services to increase R shoulder ROM/ strength to improve pain-free mobility as compared to L shoulder.       Ther.ex.: See HEP.  Manual tx.: supine R shoulder AA/PROM all planes as tolerated 10x each.   STM to R deltoid/ shoulder in supine position (light ecchymosis noted).  Discussed ice to R shoulder to decrease tenderness/ bruising.        PT Long Term Goals - 10/11/15 1405    PT LONG TERM GOAL #1   Title Pt. I with HEP to increase R shoulder AROM to WNL as compared to L shoulder (all planes) to improve pain-free mobility.   Time 4   Period Weeks   Status New   PT LONG TERM GOAL #2   Title Pt. will decrease QuickDASH to <30% to improve pain-free mobility/ sleeping.    Baseline QuickDASH: 68.2%  10/10/15   Time 4   Period Weeks   Status New   PT LONG TERM GOAL #3   Title Pt. able to turn car key in ignition with no R shoulder/elbow pain or limitations.     Time 4   Period Weeks   Status New   PT LONG TERM GOAL #4   Title Pt. able to reach overhead to hang things on clothesline with no R shoulder pain or limitations.     Time 4   Period Weeks   Status New       Problem List Patient Active Problem List   Diagnosis Date Noted  . HLD (hyperlipidemia) 01/27/2015  . Paroxysmal digital cyanosis 01/27/2015  . 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 Acquired hypothyroidism 01/11/2015  .  AD (atopic dermatitis) 01/11/2015  . Chest wall pain 01/11/2015  . Cardiomyopathy, dilated (HCC) 01/11/2015  . Essential (primary) hypertension 01/11/2015  . Arthritis, degenerative 01/11/2015  . Rheumatoid arthritis involving multiple joints (HCC) 01/11/2015  . Diffuse connective tissue disease (HCC) 12/14/2014  . Chronic systolic heart failure (HCC) 07/12/2014  . 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 [place], [place], [address] Phone: [phone]   Fax:  [phone]  Name: Katherine Baird MRN: [id number] Date of Birth: 06-22-1951

## 2015-10-12 ENCOUNTER — Encounter: Payer: Self-pay | Admitting: Physical Therapy

## 2015-10-12 ENCOUNTER — Ambulatory Visit: Payer: BLUE CROSS/BLUE SHIELD | Admitting: Physical Therapy

## 2015-10-12 DIAGNOSIS — M6281 Muscle weakness (generalized): Secondary | ICD-10-CM

## 2015-10-12 DIAGNOSIS — M25611 Stiffness of right shoulder, not elsewhere classified: Secondary | ICD-10-CM

## 2015-10-12 DIAGNOSIS — M25511 Pain in right shoulder: Secondary | ICD-10-CM | POA: Diagnosis not present

## 2015-10-12 NOTE — Therapy (Addendum)
[facility] [address] [place], [place], [address] Phone: [phone]   Fax:  [phone]  Physical Therapy Treatment  Patient Details  Name: Katherine Baird MRN: [id number] Date of Birth: 07/10/1951 Referring Provider: Dr. Hyacinth Meeker  Encounter Date: 10/12/2015    History reviewed. No pertinent past medical history.  Past Surgical History  Procedure Laterality Date  . Laparoscopic hysterectomy  2001    total    There were no vitals filed for this visit.  Visit Diagnosis:  Right shoulder pain  Stiffness of right shoulder joint  Muscle weakness   Pt reports pain to be a 5/10 from her shoulder down to her elbow. Pt reports she had increased LBP, but thinks it could possibly be attributed to her twisting and turning when chopping and putting wood into the fireplace. Pt reports she is still having trouble with driving because her steering wheel is hard to turn and it causes increased pain for her.      Objective:  Manual: Passive R shoulder flexion, scaption, and abduction 3 x 30 seconds. Anterior, posterior, and inferior glides of R shoulder 3 x 30 seconds. Soft tissue mobilization of trigger points in L sidelying position in the R scapular region then ER with the soft tissue mobilization. Ther ex: Pulleys - flexion and abduction. R shoulder shoulder flexion, scaption, and horizontal abduction AROM 10 x 2. UBE 2 fwd/2 bkwd (cool-down: no charge). Pt required verbal cues to not hold her breath during there ex. Pt response to treatment for medical necessity. Pt will benefit from skilled physical therapy to increase shoulder ROM, strength, and decrease pain in order for her to be able to use her arm to move the steering wheel and return to her prior level of function.           PT Long Term Goals - 10/11/15 1405    PT LONG TERM GOAL #1   Title Pt. I with HEP to increase R shoulder AROM to WNL as compared to L shoulder (all  planes) to improve pain-free mobility.   Time 4   Period Weeks   Status New   PT LONG TERM GOAL #2   Title Pt. will decrease QuickDASH to <30% to improve pain-free mobility/ sleeping.    Baseline QuickDASH: 68.2%  10/10/15   Time 4   Period Weeks   Status New   PT LONG TERM GOAL #3   Title Pt. able to turn car key in ignition with no R shoulder/elbow pain or limitations.     Time 4   Period Weeks   Status New   PT LONG TERM GOAL #4   Title Pt. able to reach overhead to hang things on clothesline with no R shoulder pain or limitations.     Time 4   Period Weeks   Status New       Marked increase in R shoulder AROM: flexion/ abd. >110 deg.  Pt. demonstrates good technique but pain limitations with shoulder AAROM ex. program.  Increase R shoulder posterior capsular mobility noted during manual tx. session today.  Mild ecchymosis noted in R upper arm/ biceps region.      Problem List Patient Active Problem List   Diagnosis Date Noted  . HLD (hyperlipidemia) 01/27/2015  . Paroxysmal digital cyanosis 01/27/2015  . 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 Acquired hypothyroidism 01/11/2015  . AD (atopic dermatitis) 01/11/2015  . Chest wall pain 01/11/2015  . Cardiomyopathy, dilated (HCC) 01/11/2015  .  Essential (primary) hypertension 01/11/2015  . Arthritis, degenerative 01/11/2015  . Rheumatoid arthritis involving multiple joints (HCC) 01/11/2015  . Diffuse connective tissue disease (HCC) 12/14/2014  . Chronic systolic heart failure (HCC) 07/12/2014  . 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 [place], [place], [address] Phone: [phone]   Fax:  [phone]  Name: Katherine Baird MRN: [id number] Date of Birth: 05-10-1951

## 2015-10-16 NOTE — Addendum Note (Signed)
Addended by: Cammie Mcgee on: 10/16/2015 02:17 PM   Modules accepted: Orders

## 2015-10-17 ENCOUNTER — Ambulatory Visit: Payer: BLUE CROSS/BLUE SHIELD | Admitting: Physical Therapy

## 2015-10-17 DIAGNOSIS — M25511 Pain in right shoulder: Secondary | ICD-10-CM

## 2015-10-17 DIAGNOSIS — M6281 Muscle weakness (generalized): Secondary | ICD-10-CM

## 2015-10-17 DIAGNOSIS — M25611 Stiffness of right shoulder, not elsewhere classified: Secondary | ICD-10-CM

## 2015-10-18 ENCOUNTER — Encounter: Payer: Self-pay | Admitting: Physical Therapy

## 2015-10-18 NOTE — Therapy (Signed)
[facility] [address] [place], [place], [address] Phone: [phone]   Fax:  [phone]  Physical Therapy Treatment  Patient Details  Name: Katherine Baird MRN: [id number] Date of Birth: 22-Apr-1951 Referring Provider: Dr. Hyacinth Meeker  Encounter Date: 10/17/2015      PT End of Session - 10/18/15 1043    Visit Number 3   Number of Visits 8   Date for PT Re-Evaluation 11/07/15   Authorization - Visit Number 3   Authorization - Number of Visits 10   PT Start Time 1342   PT Stop Time 1439   PT Time Calculation (min) 57 min   Activity Tolerance Patient tolerated treatment well;Patient limited by pain   Behavior During Therapy [facility] for tasks assessed/performed      History reviewed. No pertinent past medical history.  Past Surgical History  Procedure Laterality Date  . Laparoscopic hysterectomy  2001    total    There were no vitals filed for this visit.  Visit Diagnosis:  Right shoulder pain  Stiffness of right shoulder joint  Muscle weakness      Subjective Assessment - 10/18/15 1041    Subjective Pt reports 5/10 R shoulder pain occasionally radiating towards elbow. Pt reports she was raking leaves yesterday and feels some soreness. Pt states she has been completing HEP.   Limitations Lifting;House hold activities   Currently in Pain? Yes   Pain Score 5    Pain Orientation Right   Pain Descriptors / Indicators Sore;Aching;Radiating   Pain Radiating Towards elbow   Pain Onset More than a month ago   Pain Onset More than a month ago      Objectives: UBE: 2 mins forwards and 2 mins backwards. Ther ex: bicep curls with 1# x 30 with verbal postural cueing. Flexion, abduction in 90 degrees, IR (with cueing), and serratus punch with 1# x 20. AAROM in supine flexion, abduction, and ER- 2 x 10. Manual: Distraction x 5 for 30 seconds. AP/Inf R shoulder mobilizations x 3 for 25 seconds. PROM flexion, abduction, and ER x 3 for  20 seconds.  Pt response to treatment for medical necessity: Pt will benefit from skilled PT to increase range of motion in order to complete household tasks and improve functional mobility. Pt will benefit from manual techniques for pain relief and to increase range of motion.       PT Long Term Goals - 10/11/15 1405    PT LONG TERM GOAL #1   Title Pt. I with HEP to increase R shoulder AROM to WNL as compared to L shoulder (all planes) to improve pain-free mobility.   Time 4   Period Weeks   Status New   PT LONG TERM GOAL #2   Title Pt. will decrease QuickDASH to <30% to improve pain-free mobility/ sleeping.    Baseline QuickDASH: 68.2%  10/10/15   Time 4   Period Weeks   Status New   PT LONG TERM GOAL #3   Title Pt. able to turn car key in ignition with no R shoulder/elbow pain or limitations.     Time 4   Period Weeks   Status New   PT LONG TERM GOAL #4   Title Pt. able to reach overhead to hang things on clothesline with no R shoulder pain or limitations.     Time 4   Period Weeks   Status New            Plan -  10/18/15 1045    Clinical Impression Statement Pt continues to have pain with AAROM/PROM end range flexion and abduction. Pt was able to complete AAROM flexion in supine with reports of pain/discomfort at end range. Pt reports significant increase in pain with AAROM shoulder abduction in supine after 10 reps, completed second set after manual therapy. Pt reports relief with distraction. Pt reports some tenderness with AP/Inf mobilizations but was able to tolerate grade III.    Pt will benefit from skilled therapeutic intervention in order to improve on the following deficits Decreased mobility;Decreased strength;Hypomobility;Impaired flexibility;Improper body mechanics;Decreased range of motion   Rehab Potential Good   PT Frequency 2x / week   PT Duration 4 weeks   PT Treatment/Interventions ADLs/Self Care Home Management;Cryotherapy;Electrical Stimulation;Moist  Heat;Therapeutic exercise;Therapeutic activities;Functional mobility training;Patient/family education;Manual techniques;Passive range of motion   PT Next Visit Plan Increase R shoulder ROM/ stability.     PT Home Exercise Plan see handouts.    Consulted and Agree with Plan of Care Patient        Problem List Patient Active Problem List   Diagnosis Date Noted  . HLD (hyperlipidemia) 01/27/2015  . Paroxysmal digital cyanosis 01/27/2015  . 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 Acquired hypothyroidism 01/11/2015  . AD (atopic dermatitis) 01/11/2015  . Chest wall pain 01/11/2015  . Cardiomyopathy, dilated (HCC) 01/11/2015  . Essential (primary) hypertension 01/11/2015  . Arthritis, degenerative 01/11/2015  . Rheumatoid arthritis involving multiple joints (HCC) 01/11/2015  . Diffuse connective tissue disease (HCC) 12/14/2014  . Chronic systolic heart failure (HCC) 07/12/2014  . 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 [place], [place], [address] Phone: [phone]   Fax:  [phone]  Name: Katherine Baird MRN: [id number] Date of Birth: 11/07/1950

## 2015-10-19 ENCOUNTER — Encounter: Payer: BLUE CROSS/BLUE SHIELD | Admitting: Physical Therapy

## 2015-10-24 ENCOUNTER — Encounter: Payer: Self-pay | Admitting: Physical Therapy

## 2015-10-24 ENCOUNTER — Ambulatory Visit: Payer: BLUE CROSS/BLUE SHIELD | Admitting: Physical Therapy

## 2015-10-24 DIAGNOSIS — M25511 Pain in right shoulder: Secondary | ICD-10-CM | POA: Diagnosis not present

## 2015-10-24 DIAGNOSIS — M25611 Stiffness of right shoulder, not elsewhere classified: Secondary | ICD-10-CM

## 2015-10-24 DIAGNOSIS — M6281 Muscle weakness (generalized): Secondary | ICD-10-CM

## 2015-10-24 NOTE — Therapy (Addendum)
[facility] [address] [place], [place], [address] Phone: [phone]   Fax:  [phone]  Physical Therapy Treatment  Patient Details  Name: Katherine Baird MRN: [id number] Date of Birth: [birth year] Referring Provider: Dr. Hyacinth Meeker  Encounter Date: 10/24/2015      PT End of Session - 10/24/15 1356    Visit Number 4   Number of Visits 8   Date for PT Re-Evaluation 11/07/15   Authorization - Visit Number 4   Authorization - Number of Visits 10   PT Start Time 1352   PT Stop Time 1434   PT Time Calculation (min) 42 min   Activity Tolerance Patient tolerated treatment well;Patient limited by pain   Behavior During Therapy [facility] for tasks assessed/performed      History reviewed. No pertinent past medical history.  Past Surgical History  Procedure Laterality Date  . Laparoscopic hysterectomy  2001    total    There were no vitals filed for this visit.  Visit Diagnosis:  Right shoulder pain  Stiffness of right shoulder joint  Muscle weakness      Subjective Assessment - 10/24/15 1355    Subjective Pt. reports several days of R arm/elbow soreness after last tx. session.  Pt. states she was blowing leaves for hours yesterday.     Limitations Lifting;House hold activities   Patient Stated Goals Increase R shoulder ROM/ strength/ decrease pain.     Currently in Pain? Yes   Pain Score 4    Pain Location Shoulder   Pain Orientation Right      Objectives: UBE: 2 mins forwards and 2 mins backwards (6 min. Total).  Ther ex:  Standing B shoulder AROM (flexion/ abd.) with mirror feedback 10x each.  Supine wt. Wand press-ups/ triceps/ shoulder flexion/ abd./ ER 20x.   Supine serratus punches with R at 90 deg. Flexion 20x.  Standing Nautilus: seated lat. Pull downs 20x/ tricep ext. 25x/ scapular retraction 20# 20x2.  Manual: Distraction x 5 for 30 seconds. AP/Inf R shoulder mobilizations x 3 for 25 seconds. PROM flexion, abduction,  and ER x 3 for 20 seconds.  Pt response to treatment for medical necessity: Pt will benefit from skilled PT to increase range of motion in order to complete household tasks and improve functional mobility. Pt will benefit from manual techniques for pain relief and to increase range of motion.  Pt. Continues to have difficulty sleeping.  Pt. States she will sleep 2-3 hours/night and that is good for her (for years and not shoulder related).  Pt. States it is hard to relax and sleep.        PT Long Term Goals - 10/11/15 1405    PT LONG TERM GOAL #1   Title Pt. I with HEP to increase R shoulder AROM to WNL as compared to L shoulder (all planes) to improve pain-free mobility.   Time 4   Period Weeks   Status New   PT LONG TERM GOAL #2   Title Pt. will decrease QuickDASH to <30% to improve pain-free mobility/ sleeping.    Baseline QuickDASH: 68.2%  10/10/15   Time 4   Period Weeks   Status New   PT LONG TERM GOAL #3   Title Pt. able to turn car key in ignition with no R shoulder/elbow pain or limitations.     Time 4   Period Weeks   Status New   PT LONG TERM GOAL #4   Title Pt.  able to reach overhead to hang things on clothesline with no R shoulder pain or limitations.     Time 4   Period Weeks   Status New            Plan - 10/25/15 1050    Clinical Impression Statement Pt. working hard with PT to increase shoulder flexion/ abd. in standing posture.  R shoulder joint stiffness in capsule with mobs./ end-range reaching with Nautilus ex. Pt. encourage pt. to maintain R UE activity in pain-free/tolerable range.     Pt will benefit from skilled therapeutic intervention in order to improve on the following deficits Decreased mobility;Decreased strength;Hypomobility;Impaired flexibility;Improper body mechanics;Decreased range of motion   Rehab Potential Good   PT Frequency 2x / week   PT Duration 4 weeks   PT Treatment/Interventions ADLs/Self Care Home  Management;Cryotherapy;Electrical Stimulation;Moist Heat;Therapeutic exercise;Therapeutic activities;Functional mobility training;Patient/family education;Manual techniques;Passive range of motion   PT Next Visit Plan Increase R shoulder ROM/ stability.  Issue new HEP   PT Home Exercise Plan see handouts.    Consulted and Agree with Plan of Care Patient        Problem List Patient Active Problem List   Diagnosis Date Noted  . HLD (hyperlipidemia) 01/27/2015  . Paroxysmal digital cyanosis 01/27/2015  . 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 Acquired hypothyroidism 01/11/2015  . AD (atopic dermatitis) 01/11/2015  . Chest wall pain 01/11/2015  . Cardiomyopathy, dilated (HCC) 01/11/2015  . Essential (primary) hypertension 01/11/2015  . Arthritis, degenerative 01/11/2015  . Rheumatoid arthritis involving multiple joints (HCC) 01/11/2015  . Diffuse connective tissue disease (HCC) 12/14/2014  . Chronic systolic heart failure (HCC) 07/12/2014  . 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 [place], [place], [address] Phone: [phone]   Fax:  [phone]  Name: Yocelyn Brocious MRN: [id number] Date of Birth: 1951-08-02

## 2015-10-26 ENCOUNTER — Encounter: Payer: BLUE CROSS/BLUE SHIELD | Admitting: Physical Therapy

## 2015-10-31 ENCOUNTER — Ambulatory Visit: Payer: BLUE CROSS/BLUE SHIELD | Attending: Specialist | Admitting: Physical Therapy

## 2015-10-31 DIAGNOSIS — M25611 Stiffness of right shoulder, not elsewhere classified: Secondary | ICD-10-CM

## 2015-10-31 DIAGNOSIS — M6281 Muscle weakness (generalized): Secondary | ICD-10-CM | POA: Diagnosis present

## 2015-10-31 DIAGNOSIS — M25511 Pain in right shoulder: Secondary | ICD-10-CM

## 2015-11-01 NOTE — Therapy (Signed)
[facility] [address] [place], [place], [address] Phone: [phone]   Fax:  [phone]  Physical Therapy Treatment  Patient Details  Name: Katherine Baird MRN: [id number] Date of Birth: 12/09/1950 Referring Provider: Dr. Sabra Heck  Encounter Date: 10/31/2015      PT End of Session - 10/31/15 1458    Visit Number 5   Number of Visits 8   Date for PT Re-Evaluation 11/07/15   Authorization - Visit Number 5   Authorization - Number of Visits 10   PT Start Time 1497   PT Stop Time 0263   PT Time Calculation (min) 54 min   Activity Tolerance Patient tolerated treatment well   Behavior During Therapy [facility] for tasks assessed/performed      No past medical history on file.  Past Surgical History  Procedure Laterality Date  . Laparoscopic hysterectomy  2001    total    There were no vitals filed for this visit.  Visit Diagnosis:  Right shoulder pain  Stiffness of right shoulder joint  Muscle weakness      Subjective Assessment - 10/31/15 1451    Subjective Pt. states she has been having persistent R shoulder pain since last PT visit.  Pain is not too bad currently at rest but has difficulty with overhead reaching/ taking off sweatshirt and turning key in ignition.   Limitations Lifting;House hold activities   Patient Stated Goals Increase R shoulder ROM/ strength/ decrease pain.     Currently in Pain? Yes   Pain Score 2    Pain Location Shoulder   Pain Orientation Right   Pain Descriptors / Indicators Sore;Aching;Radiating   Pain Onset More than a month ago        Objectives: UBE: 2 mins forwards and 2 mins backwards (8 min. Total). Ther ex: Standing B shoulder AROM (flexion/ abd.) with mirror feedback 10x each. Supine serratus punches with R at 90 deg. Flexion 20x.See new HEP (YTB)- biceps/ triceps/ scap. Retraction/ sh. Ext./ flexion (all pain tolerable range). Reassessed all R sh. AROM/ strength.   Pt  response to treatment for medical necessity: Pt will benefit from skilled PT to increase range of motion in order to complete household tasks and improve functional mobility. Discharge from PT at this time with HEP focus.           PT Education - 10/31/15 1457    Education provided Yes   Education Details Issued latex-free YTB shoulder strengthening ex.     Person(s) Educated Patient   Methods Explanation;Demonstration;Handout   Comprehension Verbalized understanding;Returned demonstration;Need further instruction            PT Long Term Goals - 10/31/15 1400    PT LONG TERM GOAL #1   Title Pt. I with HEP to increase R shoulder AROM to WNL as compared to L shoulder (all planes) to improve pain-free mobility.   Baseline Seated R sh. flexion (105 deg.), abd. (104 deg.).  Supine R sh. flexion (118 deg.), abd. (104 deg in supine), ER (66 deg in supine), IR (80 deg in supine).   Time 4   Period Weeks   Status Partially Met   PT LONG TERM GOAL #2   Title Pt. will decrease QuickDASH to <30% to improve pain-free mobility/ sleeping.    Baseline QuickDASH: 47.7% on 3/7   Time 4   Period Weeks   Status Not Met   PT LONG TERM GOAL #3   Title Pt. able  to turn car key in ignition with no R shoulder/elbow pain or limitations.     Time 4   Period Weeks   Status Not Met   PT LONG TERM GOAL #4   Title Pt. able to reach overhead to hang things on clothesline with no R shoulder pain or limitations.     Time 4   Period Weeks   Status Not Met            Plan - 11/01/15 0744    Clinical Impression Statement Pt. has progressed well with skilled PT services but remains pain limited with R shoulder AROM and strengthening.  Pt. requested to be discharged from PT at this time secondary to insurance/ financial issues.  Pt. was issued a progressive strengthening HEP with YTB.  Discharge from PT at this time.     Pt will benefit from skilled therapeutic intervention in order to improve on the  following deficits Pain;Impaired flexibility;Decreased range of motion;Hypomobility;Postural dysfunction;Improper body mechanics;Decreased mobility;Cardiopulmonary status limiting activity   Rehab Potential Good   PT Frequency 2x / week   PT Duration 4 weeks   PT Treatment/Interventions ADLs/Self Care Home Management;Cryotherapy;Electrical Stimulation;Moist Heat;Functional mobility training;Therapeutic activities;Therapeutic exercise;Manual techniques;Patient/family education;Passive range of motion   PT Next Visit Plan Discharge from PT per pt. request/ insurance issues.  Pt. instructed to contact PT if any further questions or issues persist.     PT Home Exercise Plan See handouts   Consulted and Agree with Plan of Care Patient        Problem List Patient Active Problem List   Diagnosis Date Noted  . HLD (hyperlipidemia) 01/27/2015  . Paroxysmal digital cyanosis 01/27/2015  . Connective tissue disease, undifferentiated ([place]) 01/27/2015  . Acquired hypothyroidism 01/11/2015  . AD (atopic dermatitis) 01/11/2015  . Chest wall pain 01/11/2015  . Cardiomyopathy, dilated ([place]) 01/11/2015  . Essential (primary) hypertension 01/11/2015  . Arthritis, degenerative 01/11/2015  . Rheumatoid arthritis involving multiple joints ([place]) 01/11/2015  . Diffuse connective tissue disease ([place]) 12/14/2014  . Chronic systolic heart failure ([place]) 07/12/2014  . 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

## 2015-11-02 ENCOUNTER — Encounter: Payer: BLUE CROSS/BLUE SHIELD | Admitting: Physical Therapy

## 2015-11-27 ENCOUNTER — Other Ambulatory Visit: Payer: Self-pay | Admitting: Specialist

## 2015-11-27 DIAGNOSIS — S42224D 2-part nondisplaced fracture of surgical neck of right humerus, subsequent encounter for fracture with routine healing: Secondary | ICD-10-CM

## 2015-12-21 ENCOUNTER — Ambulatory Visit: Payer: BLUE CROSS/BLUE SHIELD

## 2015-12-26 ENCOUNTER — Ambulatory Visit
Admission: RE | Admit: 2015-12-26 | Discharge: 2015-12-26 | Disposition: A | Discharge status: Home / Self Care | Payer: BLUE CROSS/BLUE SHIELD | Source: Ambulatory Visit | Attending: Specialist | Admitting: Specialist

## 2015-12-26 DIAGNOSIS — X58XXXD Exposure to other specified factors, subsequent encounter: Secondary | ICD-10-CM | POA: Insufficient documentation

## 2015-12-26 DIAGNOSIS — S42224D 2-part nondisplaced fracture of surgical neck of right humerus, subsequent encounter for fracture with routine healing: Secondary | ICD-10-CM

## 2016-05-24 ENCOUNTER — Ambulatory Visit (INDEPENDENT_AMBULATORY_CARE_PROVIDER_SITE_OTHER): Payer: BLUE CROSS/BLUE SHIELD | Admitting: Internal Medicine

## 2016-05-24 ENCOUNTER — Encounter: Payer: Self-pay | Admitting: Internal Medicine

## 2016-05-24 ENCOUNTER — Other Ambulatory Visit: Payer: Self-pay | Admitting: Internal Medicine

## 2016-05-24 VITALS — BP 125/80 | HR 78 | Temp 98.6°F | Resp 16 | Ht 62.0 in | Wt 107.0 lb

## 2016-05-24 DIAGNOSIS — E038 Other specified hypothyroidism: Secondary | ICD-10-CM | POA: Diagnosis not present

## 2016-05-24 DIAGNOSIS — E034 Atrophy of thyroid (acquired): Secondary | ICD-10-CM

## 2016-05-24 DIAGNOSIS — L03116 Cellulitis of left lower limb: Secondary | ICD-10-CM | POA: Diagnosis not present

## 2016-05-24 DIAGNOSIS — I10 Essential (primary) hypertension: Secondary | ICD-10-CM | POA: Diagnosis not present

## 2016-05-24 DIAGNOSIS — E039 Hypothyroidism, unspecified: Secondary | ICD-10-CM | POA: Insufficient documentation

## 2016-05-24 MED ORDER — CEPHALEXIN 500 MG PO CAPS
500.0000 mg | ORAL_CAPSULE | Freq: Four times a day (QID) | ORAL | 0 refills | Status: DC
Start: 2016-05-24 — End: 2016-05-24

## 2016-05-24 MED ORDER — CEPHALEXIN 500 MG PO CAPS: 500.0000 mg | ORAL_CAPSULE | Freq: Four times a day (QID) | ORAL | 0 refills | Status: DC

## 2016-05-24 NOTE — Progress Notes (Signed)
Date:  05/24/2016   Name:  Katherine Baird   DOB:  [birth year]   MRN:  [id number]   Chief Complaint: Leg Pain (Left Leg redness pain and heat Hit the leg a few days ago and then later got redness and heat. ) Hit back of left calf on something in her garage.  It bled slightly then stopped.  Yesterday area began to be tender and now red and swollen. Tdap up to date.  Swallowing difficulty - occasional fullness in throat like choking but no regurgitation.  Similar to sx before starting thyroid medications.  Last TSH one year ago.  Review of Systems  Constitutional: Negative for chills, fatigue and fever.  Respiratory: Positive for choking (similar to when her thyroid needed adjusting). Negative for shortness of breath and wheezing.   Cardiovascular: Negative for chest pain and palpitations.  Musculoskeletal: Positive for arthralgias.  Skin: Positive for color change and wound. Negative for rash.    Patient Active Problem List   Diagnosis Date Noted  . HLD (hyperlipidemia) 01/27/2015  . Paroxysmal digital cyanosis 01/27/2015  . 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 Chest wall pain 01/11/2015  . Essential (primary) hypertension 01/11/2015  . Rheumatoid arthritis involving multiple joints (HCC) 01/11/2015  . Diffuse connective tissue disease (HCC) 12/14/2014  . Chronic systolic heart failure (HCC) 07/12/2014  . 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 Authorizing Provider  carvedilol (COREG) 6.25 MG tablet Take 1 tablet by mouth 2 (two) times daily.   Yes Historical Provider, MD  clobetasol ointment (TEMOVATE) 0.05 %  04/27/15  Yes Historical Provider, MD  hydroxychloroquine (PLAQUENIL) 200 MG tablet Take 2 tablets by mouth daily.  06/13/15  Yes Historical Provider, MD  levothyroxine (SYNTHROID, LEVOTHROID) 25 MCG tablet Take 1 tablet (25 mcg total) by mouth daily.  06/28/15  Yes Reubin Milan, MD  pseudoephedrine (SUDAFED) 120 MG 12 hr tablet Take 120 tablets by mouth once as needed. 02/16/08  Yes Historical Provider, MD  traMADol (ULTRAM) 50 MG tablet Take 1 tablet by mouth 4 (four) times daily as needed. 08/11/15  Yes Historical Provider, MD    Allergies  Allergen Reactions  . Doxycycline   . Erythromycin   . Gabapentin   . Guaifenesin     "Felt sick" and worsening of symptoms  . Nitrofuran Derivatives   . Prednisone     muscle weakness/pain  . Sulfa Antibiotics   . Tetracycline   . Azithromycin Rash    Severe rash with peeling  . Carvedilol Other (See Comments)    Dose problem with higher doses    Past Surgical History:  Procedure Laterality Date  . LAPAROSCOPIC HYSTERECTOMY  2001   total    Social History  Substance Use Topics  . Smoking status: Never Smoker  . Smokeless tobacco: Never Used  . Alcohol use No     Medication list has been reviewed and updated.   Physical Exam  Constitutional: She is oriented to person, place, and time. She appears well-developed. No distress.  HENT:  Head: Normocephalic and atraumatic.  Neck: Normal range of motion. No thyromegaly present.  Cardiovascular: Normal rate, regular rhythm and normal heart sounds.   Pulmonary/Chest: Effort normal. No respiratory distress. She has decreased breath sounds. She has no wheezes.  Musculoskeletal: Normal range of motion.  Neurological: She is alert and oriented to person, place, and time.  Skin: Skin  is warm and dry. No rash noted.     Psychiatric: She has a normal mood and affect. Her behavior is normal. Thought content normal.  Nursing note and vitals reviewed.   BP 125/80   Pulse 78   Temp 98.6 F (37 C)   Resp 16   Ht 5\' 2"  (1.575 m)   Wt 107 lb (48.5 kg)   SpO2 100%   BMI 19.57 kg/m   Assessment and Plan: 1. Cellulitis of left lower extremity Local care with TAO - cephALEXin (KEFLEX) 500 MG capsule; Take 1 capsule (500 mg total)  by mouth 4 (four) times daily.  Dispense: 40 capsule; Refill: 0 - CBC with Differential/Platelet  2. Essential (primary) hypertension controlled  3. Hypothyroidism due to acquired atrophy of thyroid Check labs and adjust if needed - TSH   , MD [facility]  05/24/2016

## 2016-05-25 LAB — CBC WITH DIFFERENTIAL/PLATELET
Basophils Absolute: 0 10*3/uL (ref 0.0–0.2)
Basos: 0 %
EOS (ABSOLUTE): 0.4 10*3/uL (ref 0.0–0.4)
EOS: 4 %
HEMATOCRIT: 34 % (ref 34.0–46.6)
Hemoglobin: 11.7 g/dL (ref 11.1–15.9)
Immature Grans (Abs): 0 10*3/uL (ref 0.0–0.1)
Immature Granulocytes: 0 %
LYMPHS ABS: 1.5 10*3/uL (ref 0.7–3.1)
Lymphs: 17 %
MCH: 28.7 pg (ref 26.6–33.0)
MCHC: 34.4 g/dL (ref 31.5–35.7)
MCV: 83 fL (ref 79–97)
MONOS ABS: 0.8 10*3/uL (ref 0.1–0.9)
Monocytes: 10 %
Neutrophils Absolute: 6 10*3/uL (ref 1.4–7.0)
Neutrophils: 69 %
Platelets: 296 10*3/uL (ref 150–379)
RBC: 4.08 x10E6/uL (ref 3.77–5.28)
RDW: 14.5 % (ref 12.3–15.4)
WBC: 8.8 10*3/uL (ref 3.4–10.8)

## 2016-05-25 LAB — TSH: TSH: 1.88 u[IU]/mL (ref 0.450–4.500)

## 2016-07-29 ENCOUNTER — Ambulatory Visit (INDEPENDENT_AMBULATORY_CARE_PROVIDER_SITE_OTHER): Payer: Medicare Other | Admitting: Internal Medicine

## 2016-07-29 ENCOUNTER — Encounter: Payer: Self-pay | Admitting: Internal Medicine

## 2016-07-29 VITALS — BP 136/78 | HR 72 | Resp 16 | Ht 62.0 in | Wt 110.6 lb

## 2016-07-29 DIAGNOSIS — G4709 Other insomnia: Secondary | ICD-10-CM

## 2016-07-29 DIAGNOSIS — R42 Dizziness and giddiness: Secondary | ICD-10-CM | POA: Diagnosis not present

## 2016-07-29 DIAGNOSIS — I5022 Chronic systolic (congestive) heart failure: Secondary | ICD-10-CM | POA: Diagnosis not present

## 2016-07-29 DIAGNOSIS — M069 Rheumatoid arthritis, unspecified: Secondary | ICD-10-CM | POA: Diagnosis not present

## 2016-07-29 MED ORDER — TRAZODONE HCL 50 MG PO TABS: 25.0000 mg | ORAL_TABLET | Freq: Every evening | ORAL | 3 refills | Status: DC | PRN
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
## 2016-07-30 LAB — COMPREHENSIVE METABOLIC PANEL
ALK PHOS: 55 IU/L (ref 39–117)
ALT: 9 IU/L (ref 0–32)
AST: 17 IU/L (ref 0–40)
Albumin/Globulin Ratio: 1.7 (ref 1.2–2.2)
Albumin: 4.4 g/dL (ref 3.6–4.8)
BUN/Creatinine Ratio: 15 (ref 12–28)
BUN: 10 mg/dL (ref 8–27)
Bilirubin Total: 0.3 mg/dL (ref 0.0–1.2)
CALCIUM: 9 mg/dL (ref 8.7–10.3)
CO2: 25 mmol/L (ref 18–29)
CREATININE: 0.66 mg/dL (ref 0.57–1.00)
Chloride: 102 mmol/L (ref 96–106)
GFR calc Af Amer: 107 mL/min/{1.73_m2} (ref >59)
GFR, EST NON AFRICAN AMERICAN: 93 mL/min/{1.73_m2} (ref >59)
GLOBULIN, TOTAL: 2.6 g/dL (ref 1.5–4.5)
GLUCOSE: 89 mg/dL (ref 65–99)
Potassium: 4.4 mmol/L (ref 3.5–5.2)
SODIUM: 141 mmol/L (ref 134–144)
Total Protein: 7 g/dL (ref 6.0–8.5)

## 2016-09-05 DIAGNOSIS — N819 Female genital prolapse, unspecified: Secondary | ICD-10-CM | POA: Diagnosis not present

## 2016-09-05 DIAGNOSIS — Z6821 Body mass index (BMI) 21.0-21.9, adult: Secondary | ICD-10-CM | POA: Diagnosis not present

## 2016-09-17 ENCOUNTER — Telehealth: Payer: Self-pay | Admitting: Internal Medicine

## 2016-09-17 ENCOUNTER — Other Ambulatory Visit: Payer: Self-pay | Admitting: Internal Medicine

## 2016-09-17 DIAGNOSIS — E039 Hypothyroidism, unspecified: Secondary | ICD-10-CM

## 2016-09-17 MED ORDER — LEVOTHYROXINE SODIUM 25 MCG PO TABS: 25.0000 ug | ORAL_TABLET | Freq: Every day | ORAL | 3 refills | Status: DC

## 2016-09-17 NOTE — Telephone Encounter (Signed)
Done

## 2016-09-17 NOTE — Telephone Encounter (Signed)
Pt called need refill Rx Levothyroxine 25, #90 day supply mcg send to [facility]

## 2016-09-19 DIAGNOSIS — Z6822 Body mass index (BMI) 22.0-22.9, adult: Secondary | ICD-10-CM | POA: Diagnosis not present

## 2016-09-19 DIAGNOSIS — N819 Female genital prolapse, unspecified: Secondary | ICD-10-CM | POA: Diagnosis not present

## 2016-10-10 DIAGNOSIS — L2084 Intrinsic (allergic) eczema: Secondary | ICD-10-CM | POA: Diagnosis not present

## 2016-10-14 ENCOUNTER — Ambulatory Visit (INDEPENDENT_AMBULATORY_CARE_PROVIDER_SITE_OTHER): Payer: Medicare Other | Admitting: Internal Medicine

## 2016-10-14 ENCOUNTER — Encounter: Payer: Self-pay | Admitting: Internal Medicine

## 2016-10-14 VITALS — BP 102/60 | HR 68 | Temp 97.1°F | Ht 59.0 in | Wt 108.0 lb

## 2016-10-14 DIAGNOSIS — J40 Bronchitis, not specified as acute or chronic: Secondary | ICD-10-CM | POA: Diagnosis not present

## 2016-10-14 MED ORDER — LEVOFLOXACIN 500 MG PO TABS: 500.0000 mg | ORAL_TABLET | Freq: Every day | ORAL | 0 refills | Status: DC

## 2016-10-14 NOTE — Patient Instructions (Signed)

## 2016-10-14 NOTE — Progress Notes (Signed)
Date:  10/14/2016   Name:  Katherine Baird   DOB:  May 01, 1951   MRN:  [id number]   Chief Complaint: Cough (X 1 week. Green production. "Feels more in my throat than in my chest.") and Back Pain (Lower right side. "Some of it is coming from having to pick mom up.") Cough  This is a new problem. The current episode started in the past 7 days. The problem has been gradually worsening. The problem occurs every few minutes. The cough is productive of purulent sputum. Associated symptoms include chills, myalgias, a rash and shortness of breath. Pertinent negatives include no chest pain, fever or wheezing. She has tried OTC cough suppressant for the symptoms. The treatment provided mild relief.  Back Pain  This is a new problem. The current episode started in the past 7 days. The problem is unchanged. The quality of the pain is described as aching. The pain does not radiate. The pain is mild. The symptoms are aggravated by lying down. Pertinent negatives include no chest pain or fever.     Review of Systems  Constitutional: Positive for chills and fatigue. Negative for fever and unexpected weight change.  Respiratory: Positive for cough and shortness of breath. Negative for chest tightness and wheezing.   Cardiovascular: Negative for chest pain and palpitations.  Musculoskeletal: Positive for back pain and myalgias.  Skin: Positive for color change and rash.  Psychiatric/Behavioral: Negative for dysphoric mood. The patient is not nervous/anxious.     Patient Active Problem List   Diagnosis Date Noted  . Hypothyroidism 05/24/2016  . HLD (hyperlipidemia) 01/27/2015  . Paroxysmal digital cyanosis 01/27/2015  . 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 Chest wall pain 01/11/2015  . Essential (primary) hypertension 01/11/2015  . Rheumatoid arthritis involving multiple joints (HCC) 01/11/2015  . Diffuse connective tissue disease (HCC) 12/14/2014  . Chronic systolic heart  failure (HCC) 30/16/0109  . 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 Authorizing Provider  carvedilol (COREG) 6.25 MG tablet Take 1 tablet by mouth 2 (two) times daily.   Yes Historical Provider, MD  clobetasol ointment (TEMOVATE) 0.05 %  04/27/15  Yes Historical Provider, MD  pseudoephedrine (SUDAFED) 120 MG 12 hr tablet Take 120 tablets by mouth once as needed. 02/16/08  Yes Historical Provider, MD  traMADol (ULTRAM) 50 MG tablet Take 1 tablet by mouth 4 (four) times daily as needed. 08/11/15  Yes Historical Provider, MD  traZODone (DESYREL) 50 MG tablet Take 0.5-1 tablets (25-50 mg total) by mouth at bedtime as needed for sleep. 07/29/16  Yes Reubin Milan, MD  levothyroxine (SYNTHROID, LEVOTHROID) 25 MCG tablet Take 1 tablet (25 mcg total) by mouth daily. Patient not taking: Reported on 10/14/2016 09/17/16   Reubin Milan, MD    Allergies  Allergen Reactions  . Doxycycline   . Erythromycin   . Gabapentin   . Guaifenesin     "Felt sick" and worsening of symptoms  . Nitrofuran Derivatives   . Prednisone     muscle weakness/pain  . Sulfa Antibiotics   . Tetracycline   . Azithromycin Rash    Severe rash with peeling  . Carvedilol Other (See Comments)    Dose problem with higher doses    Past Surgical History:  Procedure Laterality Date  . LAPAROSCOPIC HYSTERECTOMY  2001   total    Social History  Substance Use Topics  .  Smoking status: Never Smoker  . Smokeless tobacco: Never Used  . Alcohol use No     Medication list has been reviewed and updated.   Physical Exam  Constitutional: She is oriented to person, place, and time. She appears well-developed. No distress.  HENT:  Head: Normocephalic and atraumatic.  Neck: Carotid bruit is not present. No thyroid mass present.  Cardiovascular: Normal rate, regular rhythm and normal heart sounds.   Pulmonary/Chest: Effort normal.  No respiratory distress. She has decreased breath sounds (bronchial BS upper lobes). She has no wheezes. She has no rales.  Musculoskeletal: Normal range of motion.       Thoracic back: Normal.       Lumbar back: Normal.  Mild discomfort right flank with mild spasm noted  Lymphadenopathy:    She has no cervical adenopathy.  Neurological: She is alert and oriented to person, place, and time.  Skin: Skin is warm and dry. No rash noted. There is erythema.  Psychiatric: She has a normal mood and affect. Her behavior is normal. Thought content normal.  Nursing note and vitals reviewed.   BP 102/60   Pulse 68   Temp 97.1 F (36.2 C)   Ht 4\' 11"  (1.499 m)   Wt 108 lb (49 kg)   SpO2 99%   BMI 21.81 kg/m   Assessment and Plan: 1. Bronchitis Continue Mucinex Take tylenol for back discomfort - levofloxacin (LEVAQUIN) 500 MG tablet; Take 1 tablet (500 mg total) by mouth daily.  Dispense: 10 tablet; Refill: 0   , MD [facility]  10/14/2016

## 2016-10-17 ENCOUNTER — Other Ambulatory Visit: Payer: Self-pay | Admitting: Internal Medicine

## 2016-10-17 ENCOUNTER — Telehealth: Payer: Self-pay

## 2016-10-17 MED ORDER — AMOXICILLIN-POT CLAVULANATE 875-125 MG PO TABS: 1.0000 | ORAL_TABLET | Freq: Two times a day (BID) | ORAL | 0 refills | Status: DC

## 2016-10-17 NOTE — Telephone Encounter (Signed)
Cipro will not cover her for bronchitis.

## 2016-10-17 NOTE — Telephone Encounter (Signed)
Pt called requesting to get Cipro sent into pharmacy. She said she has taken this before and had no problems out of it. Pt had reaction to Levaquin with severe vomiting and nausea. I added this to her allergies.

## 2016-10-17 NOTE — Telephone Encounter (Signed)
Called pt and informed her Augmentin was sent in instead.

## 2016-10-30 DIAGNOSIS — I10 Essential (primary) hypertension: Secondary | ICD-10-CM | POA: Diagnosis not present

## 2016-10-30 DIAGNOSIS — I5022 Chronic systolic (congestive) heart failure: Secondary | ICD-10-CM | POA: Diagnosis not present

## 2016-11-18 DIAGNOSIS — N819 Female genital prolapse, unspecified: Secondary | ICD-10-CM | POA: Diagnosis not present

## 2016-11-18 DIAGNOSIS — Z6822 Body mass index (BMI) 22.0-22.9, adult: Secondary | ICD-10-CM | POA: Diagnosis not present

## 2016-12-04 ENCOUNTER — Other Ambulatory Visit: Payer: Self-pay

## 2016-12-04 DIAGNOSIS — E039 Hypothyroidism, unspecified: Secondary | ICD-10-CM

## 2016-12-04 MED ORDER — LEVOTHYROXINE SODIUM 25 MCG PO TABS: 25.0000 ug | ORAL_TABLET | Freq: Every day | ORAL | 3 refills | Status: DC

## 2016-12-09 DIAGNOSIS — M810 Age-related osteoporosis without current pathological fracture: Secondary | ICD-10-CM | POA: Diagnosis not present

## 2016-12-09 DIAGNOSIS — M1990 Unspecified osteoarthritis, unspecified site: Secondary | ICD-10-CM | POA: Diagnosis not present

## 2016-12-09 DIAGNOSIS — Z79899 Other long term (current) drug therapy: Secondary | ICD-10-CM | POA: Diagnosis not present

## 2016-12-09 DIAGNOSIS — R21 Rash and other nonspecific skin eruption: Secondary | ICD-10-CM | POA: Diagnosis not present

## 2017-01-14 DIAGNOSIS — M1990 Unspecified osteoarthritis, unspecified site: Secondary | ICD-10-CM | POA: Diagnosis not present

## 2017-01-14 DIAGNOSIS — Z79899 Other long term (current) drug therapy: Secondary | ICD-10-CM | POA: Diagnosis not present

## 2017-01-27 DIAGNOSIS — M359 Systemic involvement of connective tissue, unspecified: Secondary | ICD-10-CM | POA: Diagnosis not present

## 2017-01-27 DIAGNOSIS — M1990 Unspecified osteoarthritis, unspecified site: Secondary | ICD-10-CM | POA: Diagnosis not present

## 2017-01-27 DIAGNOSIS — M778 Other enthesopathies, not elsewhere classified: Secondary | ICD-10-CM | POA: Diagnosis not present

## 2017-01-27 DIAGNOSIS — M545 Low back pain: Secondary | ICD-10-CM | POA: Diagnosis not present

## 2017-01-27 DIAGNOSIS — Z79899 Other long term (current) drug therapy: Secondary | ICD-10-CM | POA: Diagnosis not present

## 2017-01-28 ENCOUNTER — Other Ambulatory Visit: Payer: Self-pay | Admitting: Rheumatology

## 2017-01-28 DIAGNOSIS — M545 Low back pain, unspecified: Secondary | ICD-10-CM

## 2017-01-28 DIAGNOSIS — G8929 Other chronic pain: Secondary | ICD-10-CM

## 2017-02-01 ENCOUNTER — Encounter: Payer: Self-pay | Admitting: Radiology

## 2017-02-01 ENCOUNTER — Ambulatory Visit
Admission: RE | Admit: 2017-02-01 | Discharge: 2017-02-01 | Disposition: A | Discharge status: Home / Self Care | Payer: Medicare Other | Source: Ambulatory Visit | Attending: Rheumatology | Admitting: Rheumatology

## 2017-02-01 DIAGNOSIS — G8929 Other chronic pain: Secondary | ICD-10-CM | POA: Diagnosis not present

## 2017-02-01 DIAGNOSIS — M545 Low back pain, unspecified: Secondary | ICD-10-CM

## 2017-02-01 DIAGNOSIS — M5136 Other intervertebral disc degeneration, lumbar region: Secondary | ICD-10-CM | POA: Diagnosis not present

## 2017-02-01 DIAGNOSIS — M5126 Other intervertebral disc displacement, lumbar region: Secondary | ICD-10-CM | POA: Diagnosis not present

## 2017-02-01 DIAGNOSIS — M48061 Spinal stenosis, lumbar region without neurogenic claudication: Secondary | ICD-10-CM | POA: Diagnosis not present

## 2017-02-01 DIAGNOSIS — M1990 Unspecified osteoarthritis, unspecified site: Secondary | ICD-10-CM | POA: Diagnosis not present

## 2017-02-01 DIAGNOSIS — M4186 Other forms of scoliosis, lumbar region: Secondary | ICD-10-CM | POA: Diagnosis not present

## 2017-02-06 DIAGNOSIS — Z6822 Body mass index (BMI) 22.0-22.9, adult: Secondary | ICD-10-CM | POA: Diagnosis not present

## 2017-02-06 DIAGNOSIS — N819 Female genital prolapse, unspecified: Secondary | ICD-10-CM | POA: Diagnosis not present

## 2017-02-24 ENCOUNTER — Encounter: Payer: Self-pay | Admitting: Internal Medicine

## 2017-02-24 ENCOUNTER — Ambulatory Visit (INDEPENDENT_AMBULATORY_CARE_PROVIDER_SITE_OTHER): Payer: Medicare Other | Admitting: Internal Medicine

## 2017-02-24 VITALS — BP 138/76 | HR 73 | Ht 59.0 in | Wt 111.0 lb

## 2017-02-24 DIAGNOSIS — R55 Syncope and collapse: Secondary | ICD-10-CM

## 2017-02-24 DIAGNOSIS — K089 Disorder of teeth and supporting structures, unspecified: Secondary | ICD-10-CM | POA: Diagnosis not present

## 2017-02-24 DIAGNOSIS — I5022 Chronic systolic (congestive) heart failure: Secondary | ICD-10-CM

## 2017-02-24 DIAGNOSIS — N3000 Acute cystitis without hematuria: Secondary | ICD-10-CM

## 2017-02-24 DIAGNOSIS — M069 Rheumatoid arthritis, unspecified: Secondary | ICD-10-CM | POA: Diagnosis not present

## 2017-02-24 LAB — POCT URINALYSIS DIPSTICK
Bilirubin, UA: NEGATIVE
Glucose, UA: NEGATIVE
KETONES UA: NEGATIVE
NITRITE UA: NEGATIVE
PH UA: 6.5 (ref 5.0–8.0)
PROTEIN UA: 100
Spec Grav, UA: 1.015 (ref 1.010–1.025)
UROBILINOGEN UA: 0.2 U/dL

## 2017-02-24 MED ORDER — CIPROFLOXACIN HCL 250 MG PO TABS: 250.0000 mg | ORAL_TABLET | Freq: Two times a day (BID) | ORAL | 0 refills | Status: DC

## 2017-02-24 MED ORDER — CETIRIZINE HCL 10 MG PO TABS: 10.0000 mg | ORAL_TABLET | Freq: Every day | ORAL | 5 refills | Status: DC

## 2017-02-24 NOTE — Progress Notes (Signed)
Date:  02/24/2017   Name:  Katherine Baird   DOB:  [birth year]   MRN:  [id number]   Chief Complaint: teeth breaking (Started a couple months ago. Can't get prolia shot anymore. Chunks of teeth breaking off- Needs help with what to do. Top and bottem are breaking. Being told by dentist they will all need to be pulled. She knows its osteoporosis but wants advice. ) and Urinary Tract Infection (Burning, itching, and pain during urination. Believes she has UTI. ) Urinary Tract Infection   This is a new problem. The current episode started 1 to 4 weeks ago. The problem occurs every urination. The pain is mild. There has been no fever. Associated symptoms include frequency and urgency. Pertinent negatives include no chills. She has tried nothing for the symptoms.  Dental Pain   This is a recurrent (teeth breaking off - needs to have them all pulled) problem. Associated symptoms include sinus pressure. Pertinent negatives include no fever. She has tried nothing (teeth are breaking - not coming out of the jaw.  No cavities despite having sjogrens.  Also was on prolia and wondering if it contributed) for the symptoms.   CHF- remains on coreg.  No change in baseline SOB. No chest pain. She reports multiple episodes per day - she 'blanks out' for a few seconds.  At times she actually passes out - last week found herself on the floor.  She has never had a cardiac monitor.  RA/connective tissue disease - followed by Rheumatology.  Has been referred to pain management for back pain. MRI lumbar: IMPRESSION: 1. Symptomatic level appears to be L5-S1 where a small to moderate disc herniation is eccentric to the right and most affects the descending right S1 nerve roots in the lateral recess. Superimposed multifactorial moderate to severe right L5 neural foraminal stenosis. 2. Underlying dextroconvex lumbar scoliosis with otherwise mostly left-sided degenerative findings and neural impingement which  is predominately mild. Up to moderate left lateral recess and foraminal stenosis at L2-L3.  Review of Systems  Constitutional: Positive for fatigue. Negative for chills and fever.  HENT: Positive for congestion, postnasal drip and sinus pressure.   Eyes: Negative for visual disturbance.  Respiratory: Negative for chest tightness, shortness of breath and wheezing.   Cardiovascular: Negative for chest pain.  Gastrointestinal: Negative for abdominal pain.  Genitourinary: Positive for dysuria, frequency and urgency.  Musculoskeletal: Positive for arthralgias, back pain, joint swelling and myalgias.  Allergic/Immunologic: Positive for environmental allergies.  Neurological: Positive for syncope. Negative for headaches.  Hematological: Negative for adenopathy.    Patient Active Problem List   Diagnosis Date Noted  . Hypothyroidism 05/24/2016  . HLD (hyperlipidemia) 01/27/2015  . Paroxysmal digital cyanosis 01/27/2015  . 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 Chest wall pain 01/11/2015  . Essential (primary) hypertension 01/11/2015  . Rheumatoid arthritis involving multiple joints (HCC) 01/11/2015  . Diffuse connective tissue disease (HCC) 12/14/2014  . Chronic systolic heart failure (HCC) 07/12/2014  . 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 Authorizing Provider  carvedilol (COREG) 6.25 MG tablet Take 1 tablet by mouth 2 (two) times daily.   Yes [provider]  clobetasol ointment (TEMOVATE) 0.05 %  04/27/15  Yes [provider]  levothyroxine (SYNTHROID, LEVOTHROID) 25 MCG tablet Take 1 tablet (25 mcg total) by mouth daily. 12/04/16  Yes Reubin Milan, MD  pseudoephedrine (SUDAFED) 120  MG 12 hr tablet Take 120 tablets by mouth once as needed. 02/16/08  Yes [provider]    Allergies  Allergen Reactions  . Levofloxacin Nausea And  Vomiting  . Doxycycline   . Erythromycin   . Gabapentin   . Guaifenesin     "Felt sick" and worsening of symptoms  . Nitrofuran Derivatives   . Prednisone     muscle weakness/pain  . Sulfa Antibiotics   . Tetracycline   . Azithromycin Rash    Severe rash with peeling  . Carvedilol Other (See Comments)    Dose problem with higher doses    Past Surgical History:  Procedure Laterality Date  . LAPAROSCOPIC HYSTERECTOMY  2001   total    Social History  Substance Use Topics  . Smoking status: Never Smoker  . Smokeless tobacco: Never Used  . Alcohol use No     Medication list has been reviewed and updated.   Physical Exam  Constitutional: She is oriented to person, place, and time. She appears well-developed. No distress.  HENT:  Head: Normocephalic and atraumatic.  Cardiovascular: Normal rate and regular rhythm.  Exam reveals no gallop.   No murmur heard. Pulmonary/Chest: Effort normal. No respiratory distress. She has no wheezes.  Abdominal: Soft. Bowel sounds are normal. There is no tenderness.  Musculoskeletal:       Lumbar back: She exhibits decreased range of motion and tenderness.  Neurological: She is alert and oriented to person, place, and time.  Skin: Skin is warm and dry.  Psychiatric: She has a normal mood and affect. Her speech is normal and behavior is normal. Thought content normal.  Nursing note and vitals reviewed.   BP 138/76   Pulse 73   Ht 4\' 11"  (1.499 m)   Wt 111 lb (50.3 kg)   SpO2 99%   BMI 22.42 kg/m   Assessment and Plan: 1. Acute cystitis without hematuria - POCT urinalysis dipstick - ciprofloxacin (CIPRO) 250 MG tablet; Take 1 tablet (250 mg total) by mouth 2 (two) times daily.  Dispense: 10 tablet; Refill: 0  2. Rheumatoid arthritis involving multiple joints (HCC) Follow up with specialist  3. Chronic systolic heart failure (HCC) Compensated but with worrisome symptoms of syncope  4. Syncope, unspecified syncope type Call  cardiology today for appt to rule out cardiac source  5. Tooth disorder Proceed as advised by dentist  Meds ordered this encounter  Medications  . ciprofloxacin (CIPRO) 250 MG tablet    Sig: Take 1 tablet (250 mg total) by mouth 2 (two) times daily.    Dispense:  10 tablet    Refill:  0  . cetirizine (ZYRTEC ALLERGY) 10 MG tablet    Sig: Take 1 tablet (10 mg total) by mouth daily.    Dispense:  30 tablet    Refill:  5    , MD [facility]  02/24/2017

## 2017-02-24 NOTE — Patient Instructions (Signed)
Call your cardiologist today to be seen soon for syncope.

## 2017-02-27 ENCOUNTER — Ambulatory Visit: Admission: RE | Admit: 2017-02-27 | Discharge: 2017-02-27 | Payer: MEDICARE

## 2017-02-27 DIAGNOSIS — L2084 Intrinsic (allergic) eczema: Principal | ICD-10-CM

## 2017-03-03 DIAGNOSIS — M5416 Radiculopathy, lumbar region: Secondary | ICD-10-CM | POA: Diagnosis not present

## 2017-03-03 DIAGNOSIS — M5126 Other intervertebral disc displacement, lumbar region: Secondary | ICD-10-CM | POA: Diagnosis not present

## 2017-03-04 DIAGNOSIS — I10 Essential (primary) hypertension: Secondary | ICD-10-CM | POA: Diagnosis not present

## 2017-03-04 DIAGNOSIS — Z9889 Other specified postprocedural states: Secondary | ICD-10-CM | POA: Diagnosis not present

## 2017-03-04 DIAGNOSIS — R55 Syncope and collapse: Secondary | ICD-10-CM | POA: Diagnosis not present

## 2017-03-04 DIAGNOSIS — I5022 Chronic systolic (congestive) heart failure: Secondary | ICD-10-CM | POA: Diagnosis not present

## 2017-03-06 DIAGNOSIS — R55 Syncope and collapse: Secondary | ICD-10-CM | POA: Diagnosis not present

## 2017-03-13 DIAGNOSIS — I5022 Chronic systolic (congestive) heart failure: Secondary | ICD-10-CM | POA: Diagnosis not present

## 2017-03-13 DIAGNOSIS — M25551 Pain in right hip: Secondary | ICD-10-CM | POA: Diagnosis not present

## 2017-03-13 DIAGNOSIS — R55 Syncope and collapse: Secondary | ICD-10-CM | POA: Diagnosis not present

## 2017-03-13 DIAGNOSIS — M4726 Other spondylosis with radiculopathy, lumbar region: Secondary | ICD-10-CM | POA: Diagnosis not present

## 2017-03-13 DIAGNOSIS — I10 Essential (primary) hypertension: Secondary | ICD-10-CM | POA: Diagnosis not present

## 2017-03-17 DIAGNOSIS — M7062 Trochanteric bursitis, left hip: Secondary | ICD-10-CM | POA: Diagnosis not present

## 2017-03-18 ENCOUNTER — Ambulatory Visit: Payer: BLUE CROSS/BLUE SHIELD | Admitting: Physical Therapy

## 2017-03-20 DIAGNOSIS — M545 Low back pain: Secondary | ICD-10-CM | POA: Diagnosis not present

## 2017-03-20 DIAGNOSIS — G8929 Other chronic pain: Secondary | ICD-10-CM | POA: Diagnosis not present

## 2017-03-20 DIAGNOSIS — M810 Age-related osteoporosis without current pathological fracture: Secondary | ICD-10-CM | POA: Diagnosis not present

## 2017-03-20 DIAGNOSIS — M359 Systemic involvement of connective tissue, unspecified: Secondary | ICD-10-CM | POA: Diagnosis not present

## 2017-03-31 ENCOUNTER — Encounter: Payer: Self-pay | Admitting: Physical Therapy

## 2017-03-31 ENCOUNTER — Ambulatory Visit: Payer: Medicare Other | Attending: Rheumatology | Admitting: Physical Therapy

## 2017-03-31 DIAGNOSIS — G8929 Other chronic pain: Secondary | ICD-10-CM | POA: Insufficient documentation

## 2017-03-31 DIAGNOSIS — M2560 Stiffness of unspecified joint, not elsewhere classified: Secondary | ICD-10-CM | POA: Diagnosis not present

## 2017-03-31 DIAGNOSIS — M25552 Pain in left hip: Secondary | ICD-10-CM | POA: Diagnosis not present

## 2017-03-31 DIAGNOSIS — M6281 Muscle weakness (generalized): Secondary | ICD-10-CM | POA: Diagnosis not present

## 2017-03-31 DIAGNOSIS — M25551 Pain in right hip: Secondary | ICD-10-CM | POA: Diagnosis not present

## 2017-03-31 DIAGNOSIS — M5442 Lumbago with sciatica, left side: Secondary | ICD-10-CM | POA: Insufficient documentation

## 2017-03-31 NOTE — Therapy (Signed)
[place] [facility] [address] [place], [place], [address] Phone: [phone]   Fax:  [phone]  Physical Therapy Evaluation  Patient Details  Name: Katherine Baird MRN: [id number] Date of Birth: [birth year] Referring Provider: Dr. Andrez Grime  Encounter Date: 03/31/2017      PT End of Session - 03/31/17 1356    Visit Number 1   Number of Visits 8   Date for PT Re-Evaluation 04/28/17   Authorization - Visit Number 1   Authorization - Number of Visits 10   PT Start Time 1344   PT Stop Time 1454   PT Time Calculation (min) 70 min   Activity Tolerance Patient tolerated treatment well;Patient limited by pain   Behavior During Therapy [facility] for tasks assessed/performed      History reviewed. No pertinent past medical history.  Past Surgical History:  Procedure Laterality Date  . LAPAROSCOPIC HYSTERECTOMY  2001   total    There were no vitals filed for this visit.       Subjective Assessment - 03/31/17 1354    Subjective Pt. reports to PT with 6/10 B hip/low back pain currently at rest, and reports she has been very active this morning which flares up her back and B hips. Pt reports LBP for the past "several weeks" with insidious onset. Pt denies recent falls and states she is very careful. Pt responsible for taking care of aging mother, and responsibilities include getting her mother in and out of shower which is difficult for pt. She also is required to bend over to feed cat and states she has difficulty picking up cat.    Pertinent History Pt. reports radicular symptoms in back of LE.     Limitations Lifting;House hold activities;Walking;Standing   Patient Stated Goals Increase R shoulder ROM/ strength/ decrease pain.     Currently in Pain? Yes   Pain Score 6    Pain Location Hip   Pain Orientation Right;Left   Pain Descriptors / Indicators Aching   Multiple Pain Sites Yes   Pain Score 6   Pain Location Back   Pain  Orientation Lower   Pain Descriptors / Indicators Aching;Radiating   Pain Radiating Towards down back of LE.              [facility] PT Assessment - 03/31/17 0001      Assessment   Medical Diagnosis B hip pain/ Low back pain with radicular symptoms   Referring Provider Dr. Andrez Grime   Onset Date/Surgical Date 02/23/17   Prior Therapy yes, for R shoulder fx. in 2017.       Precautions   Precautions None     Restrictions   Weight Bearing Restrictions No     Balance Screen   Has the patient fallen in the past 6 months No   Has the patient had a decrease in activity level because of a fear of falling?  Yes   Is the patient reluctant to leave their home because of a fear of falling?  No     LLD: LLE <1cm shorter than RLE  SLR: positive bilaterally  MMT: 3+/5 gross BLE   Muscle length: Decreased hamstring muscle length. R: 39deg; L: 32deg Decreased piriformis muscle length bilaterally  Manual:  Pain reported with inferior hip mobs, especially on L hip R and L hip distraction mob grade II pt reports decrease in pain and reported it "feels good" Tenderness to palpation of: L lumbar paraspinals with palpable  inflammation; L1-L5 spinous processes (severe tenderness with inability to perform grade I central mob due to pain); superior sacrum, B PSIS; piriformis  LEFS: 17/80 (severe decrease in function) MODI: 66% (extreme disability)  Objective measurements completed on examination: See above findings.   TherEx: Standing lumbar extensions x10. Pt reports decrease in pain from beginning to end Prone press ups x10 Self piriformis stretch in supine x15 Self sciatic nerve glides x15      PT Education - 03/31/17 1457    Education provided Yes   Education Details see handout   Person(s) Educated Patient   Methods Explanation;Handout;Demonstration;Tactile cues;Verbal cues   Comprehension Verbalized understanding;Returned demonstration             PT Long Term Goals  - 03/31/17 1742      PT LONG TERM GOAL #1   Title Pt will decrease mODI score to <40% in order to increase function and improve quality of life   Baseline 03/31/17 66%   Time 4   Period Weeks   Status New   Target Date 05/26/17     PT LONG TERM GOAL #2   Title Pt will increase LEFS score to 30/80 in order to demonstrate improved function and ability to participate in desired activities   Baseline 03/31/17 17/80   Time 4   Period Weeks   Status New   Target Date 05/26/17     PT LONG TERM GOAL #3   Title Pt will increase LE MMT to 4/5 in order to allow pt to participate in community activities   Baseline 03/31/17: 3+/5   Time 4   Period Weeks   Status New   Target Date 05/26/17     PT LONG TERM GOAL #4   Title Pt will report <4/10 pain in B hips and back with daily activities in order to improve quality of life   Baseline 03/31/17: 6/10 pain in B hips and back   Time 4   Period Weeks   Status New   Target Date 05/26/17                Plan - 03/31/17 1357    Clinical Impression Statement Pt age 66 reports to PT with 6/10 LBP with insidious onset. Pt presents with severe thoracic kyphosis and decreased lumbar lordosis, and demonstrates decreased LE strength with gross LE MMT 3+/5. Pt also demonstrates decreased hamstring muscle length with R>L, and also demonstrates decreased bilateral piriformis muscle length. Pt reports severe pain with inferior mob of L hip and positive SLR bilaterally. During palpation, pt reports severe tenderness of L lumbar paraspinals with palpable inflamed tissue, and also reports tenderness to palpation of L1-S1. Pt demonstrates minor decrease in leg length on L. Pt reports severe decrease in LE function with score of 17/80 on LEFS, and also reports extreme disbaility with mODI score of 66%. Pt will benefit from skilled PT in order to decrease pain and increase strength to improve pt's quality of life and ability to participate in desired activities.    Clinical Presentation Stable   Clinical Decision Making Moderate   Rehab Potential Good   PT Frequency 2x / week   PT Duration 4 weeks   PT Treatment/Interventions ADLs/Self Care Home Management;Cryotherapy;Electrical Stimulation;Moist Heat;Functional mobility training;Therapeutic activities;Therapeutic exercise;Manual techniques;Patient/family education;Passive range of motion;Neuromuscular re-education;Gait training;Balance training   PT Next Visit Plan Progress HEP   PT Home Exercise Plan See handouts   Consulted and Agree with Plan of Care Patient  Patient will benefit from skilled therapeutic intervention in order to improve the following deficits and impairments:  Pain, Impaired flexibility, Decreased range of motion, Hypomobility, Postural dysfunction, Improper body mechanics, Decreased mobility, Cardiopulmonary status limiting activity, Decreased strength, Decreased activity tolerance  Visit Diagnosis: Pain in left hip  Pain in right hip  Chronic bilateral low back pain with left-sided sciatica  Joint stiffness  Muscle weakness (generalized)      G-Codes - April 17, 2017 1802    Functional Assessment Tool Used (Outpatient Only) Clinical impression/ pain/ joint stiffness/ muscle weakness/ MODI/ LEFS.   Functional Limitation Mobility: Walking and moving around   Mobility: Walking and Moving Around Current Status [phone]) At least 40 percent but less than 60 percent impaired, limited or restricted   Mobility: Walking and Moving Around Goal Status [phone]) At least 1 percent but less than 20 percent impaired, limited or restricted       Problem List Patient Active Problem List   Diagnosis Date Noted  . 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 Syncope 02/24/2017  . Hypothyroidism 05/24/2016  . HLD (hyperlipidemia) 01/27/2015  . Paroxysmal digital cyanosis 01/27/2015  . 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 Chest wall pain 01/11/2015  . Essential (primary)  hypertension 01/11/2015  . Rheumatoid arthritis involving multiple joints (HCC) 01/11/2015  . Diffuse connective tissue disease (HCC) 12/14/2014  . Chronic systolic heart failure (HCC) 07/12/2014  . 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 [place], [place], [address] Phone: [phone]   Fax:  [phone]  Name: Katherine Baird MRN: [id number] Date of Birth: May 22, 1951

## 2017-04-02 ENCOUNTER — Encounter: Payer: Medicare Other | Admitting: Physical Therapy

## 2017-04-07 ENCOUNTER — Encounter: Payer: Medicare Other | Admitting: Physical Therapy

## 2017-04-07 DIAGNOSIS — M25551 Pain in right hip: Secondary | ICD-10-CM | POA: Diagnosis not present

## 2017-04-08 ENCOUNTER — Ambulatory Visit: Payer: Medicare Other | Admitting: Physical Therapy

## 2017-04-08 ENCOUNTER — Encounter: Payer: Self-pay | Admitting: Physical Therapy

## 2017-04-08 DIAGNOSIS — M25551 Pain in right hip: Secondary | ICD-10-CM

## 2017-04-08 DIAGNOSIS — M2560 Stiffness of unspecified joint, not elsewhere classified: Secondary | ICD-10-CM

## 2017-04-08 DIAGNOSIS — G8929 Other chronic pain: Secondary | ICD-10-CM

## 2017-04-08 DIAGNOSIS — M6281 Muscle weakness (generalized): Secondary | ICD-10-CM | POA: Diagnosis not present

## 2017-04-08 DIAGNOSIS — M5442 Lumbago with sciatica, left side: Secondary | ICD-10-CM

## 2017-04-08 DIAGNOSIS — M25552 Pain in left hip: Secondary | ICD-10-CM

## 2017-04-08 NOTE — Therapy (Signed)
[place] [facility] [address] [place], [place], [address] Phone: [phone]   Fax:  [phone]  Physical Therapy Treatment  Patient Details  Name: Katherine Baird MRN: [id number] Date of Birth: Jun 24, 1951 Referring Provider: Dr. Andrez Grime  Encounter Date: 04/08/2017      PT End of Session - 04/08/17 1221    Visit Number 2   Number of Visits 8   Date for PT Re-Evaluation 04/28/17   Authorization - Visit Number 2   Authorization - Number of Visits 10   PT Start Time 1055   PT Stop Time 1154   PT Time Calculation (min) 59 min   Activity Tolerance Patient tolerated treatment well;Patient limited by pain   Behavior During Therapy [facility] for tasks assessed/performed      History reviewed. No pertinent past medical history.  Past Surgical History:  Procedure Laterality Date  . LAPAROSCOPIC HYSTERECTOMY  2001   total    There were no vitals filed for this visit.      Subjective Assessment - 04/08/17 1219    Subjective Pt reports to PT with 9/10 R hip pain and reports L hip and back have improved. Pt reports she felt good after previous tx session, but R hip decided to "act up" on her. Pt states she can hardly tolerate WB and reports her R hip feels the same way she has felt when she has broken bones in the past. Pt reports xrays yesterday were clear.   Pertinent History Pt. reports radicular symptoms in back of LE.     Limitations Lifting;House hold activities;Walking;Standing   Patient Stated Goals Increase R shoulder ROM/ strength/ decrease pain.     Currently in Pain? Yes   Pain Score 9    Pain Location Hip   Pain Orientation Right   Pain Descriptors / Indicators Aching   Pain Type Acute pain   Pain Onset In the past 7 days   Pain Frequency Constant   Multiple Pain Sites No     Treatment:  Manual:  R hip distraction mobs 2x30s. Pt reported pain in R knee R hip inferior mobs 3x30s. Pt reports decrease in R knee and  hip pain B hamstring stretch 3x30s. Pt reports "nerve pain" R sciatic nerve flossing 2x30s. Pt reports decrease in nerve pain   TherEx: NuStep L6 x14min Supine heel sides with towel assist as needed x15 Supine B hip abduction with towel assist as needed x15 SAQs with green bolster x15 Seated LAQs x15  Pt instructed on proper gait mechanics with quad cane, and prefers quad cane in R hand  Pt tolerated tx well with no increase in pain       PT Education - 04/08/17 1221    Education provided Yes   Education Details see handout; safety with HEP   Person(s) Educated Patient   Methods Explanation;Handout;Demonstration;Verbal cues   Comprehension Verbalized understanding;Returned demonstration             PT Long Term Goals - 03/31/17 1742      PT LONG TERM GOAL #1   Title Pt will decrease mODI score to <40% in order to increase function and improve quality of life   Baseline 03/31/17 66%   Time 4   Period Weeks   Status New   Target Date 05/26/17     PT LONG TERM GOAL #2   Title Pt will increase LEFS score to 30/80 in order to demonstrate improved function and ability to  participate in desired activities   Baseline 03/31/17 17/80   Time 4   Period Weeks   Status New   Target Date 05/26/17     PT LONG TERM GOAL #3   Title Pt will increase LE MMT to 4/5 in order to allow pt to participate in community activities   Baseline 03/31/17: 3+/5   Time 4   Period Weeks   Status New   Target Date 05/26/17     PT LONG TERM GOAL #4   Title Pt will report <4/10 pain in B hips and back with daily activities in order to improve quality of life   Baseline 03/31/17: 6/10 pain in B hips and back   Time 4   Period Weeks   Status New   Target Date 05/26/17           Plan - 04/08/17 1449    Clinical Impression Statement Pt reports to PT with 9/10 R hip pain but states L hip and back pain have improved since previous tx session. Pt demonstrates antalgic gait with quad cane in R  hand and required demonstration and verbal cues to properly utilize AD. Pt reports most of her R hip pain is during WB and states pain improves with sitting and supine activities. Pt demonstrates decreased hamstring muscle length bilaterally and reported nerve pain during L hamstring stretch, and pt reports improvement.    Clinical Presentation Stable   Clinical Decision Making Moderate   Rehab Potential Good   PT Frequency 2x / week   PT Duration 4 weeks   PT Treatment/Interventions ADLs/Self Care Home Management;Cryotherapy;Electrical Stimulation;Moist Heat;Functional mobility training;Therapeutic activities;Therapeutic exercise;Manual techniques;Patient/family education;Passive range of motion;Neuromuscular re-education;Gait training;Balance training   PT Next Visit Plan Progress HEP   PT Home Exercise Plan See handouts   Consulted and Agree with Plan of Care Patient      Patient will benefit from skilled therapeutic intervention in order to improve the following deficits and impairments:  Pain, Impaired flexibility, Decreased range of motion, Hypomobility, Postural dysfunction, Improper body mechanics, Decreased mobility, Cardiopulmonary status limiting activity, Decreased strength, Decreased activity tolerance  Visit Diagnosis: Pain in right hip  Joint stiffness  Muscle weakness (generalized)  Pain in left hip  Chronic bilateral low back pain with left-sided sciatica     Problem List Patient Active Problem List   Diagnosis Date Noted  . 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 Syncope 02/24/2017  . Hypothyroidism 05/24/2016  . HLD (hyperlipidemia) 01/27/2015  . Paroxysmal digital cyanosis 01/27/2015  . 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 Chest wall pain 01/11/2015  . Essential (primary) hypertension 01/11/2015  . Rheumatoid arthritis involving multiple joints (HCC) 01/11/2015  . Diffuse connective tissue disease (HCC) 12/14/2014  . Chronic systolic heart  failure (HCC) 16/55/3748  . 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 [place], [place], [address] Phone: [phone]   Fax:  [phone]  Name: Katherine Baird MRN: [id number] Date of Birth: 12/08/1950

## 2017-04-09 ENCOUNTER — Encounter: Payer: Medicare Other | Admitting: Physical Therapy

## 2017-04-10 DIAGNOSIS — H16202 Unspecified keratoconjunctivitis, left eye: Secondary | ICD-10-CM | POA: Diagnosis not present

## 2017-04-11 ENCOUNTER — Ambulatory Visit (INDEPENDENT_AMBULATORY_CARE_PROVIDER_SITE_OTHER): Payer: Medicare Other | Admitting: Internal Medicine

## 2017-04-11 ENCOUNTER — Encounter: Payer: Self-pay | Admitting: Internal Medicine

## 2017-04-11 VITALS — BP 140/78 | HR 84 | Ht 59.0 in | Wt 114.0 lb

## 2017-04-11 DIAGNOSIS — B029 Zoster without complications: Secondary | ICD-10-CM | POA: Diagnosis not present

## 2017-04-11 MED ORDER — VALACYCLOVIR HCL 1 G PO TABS: 1000.0000 mg | ORAL_TABLET | Freq: Three times a day (TID) | ORAL | 0 refills | Status: AC

## 2017-04-11 NOTE — Patient Instructions (Signed)
Take Lyrica 50 mg twice a day for 10 days.

## 2017-04-11 NOTE — Progress Notes (Signed)
Date:  04/11/2017   Name:  Katherine Baird   DOB:  29-Dec-1950   MRN:  [id number]   Chief Complaint: Rash (Started 2 weeks ago. Painful. Didn't think anything about it because she always gets rashs'.  Seen ortho last week and said he stated something about shingles. Her hips are becoming worse since rash and starting to think they are related. Rash is on left buttcheek.  ) Rash  This is a new problem. The current episode started 1 to 4 weeks ago. The problem is unchanged. The affected locations include the left buttock. The rash is characterized by blistering, redness and pain. She was exposed to nothing. Pertinent negatives include no fatigue, fever or shortness of breath. Past treatments include nothing.   She also has more pain in the right hip and wonders if this could be from the shingles.  She was seen last week and had xrays of right hip - not impressive per Ortho at [facility].   Review of Systems  Constitutional: Negative for chills, fatigue and fever.  Respiratory: Negative for chest tightness and shortness of breath.   Cardiovascular: Negative for chest pain.  Musculoskeletal: Positive for arthralgias (hip pain).  Skin: Positive for rash.  Psychiatric/Behavioral: Negative for dysphoric mood and sleep disturbance.    Patient Active Problem List   Diagnosis Date Noted  . 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 Syncope 02/24/2017  . Hypothyroidism 05/24/2016  . HLD (hyperlipidemia) 01/27/2015  . Paroxysmal digital cyanosis 01/27/2015  . 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 Chest wall pain 01/11/2015  . Essential (primary) hypertension 01/11/2015  . Rheumatoid arthritis involving multiple joints (HCC) 01/11/2015  . Diffuse connective tissue disease (HCC) 12/14/2014  . Chronic systolic heart failure (HCC) 07/12/2014  . 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  Authorizing Provider  carvedilol (COREG) 6.25 MG tablet Take 1 tablet by mouth 2 (two) times daily.   Yes [provider]  cetirizine (ZYRTEC ALLERGY) 10 MG tablet Take 1 tablet (10 mg total) by mouth daily. 02/24/17  Yes Reubin Milan, MD  clobetasol ointment (TEMOVATE) 0.05 %  04/27/15  Yes [provider]  levothyroxine (SYNTHROID, LEVOTHROID) 25 MCG tablet Take 1 tablet (25 mcg total) by mouth daily. 12/04/16  Yes Reubin Milan, MD  neomycin-polymyxin-dexameth (MAXITROL) 0.1 % OINT 1 application.   Yes [provider]  pseudoephedrine (SUDAFED) 120 MG 12 hr tablet Take 120 tablets by mouth once as needed. 02/16/08  Yes [provider]    Allergies  Allergen Reactions  . Levofloxacin Nausea And Vomiting  . Doxycycline   . Erythromycin   . Gabapentin   . Guaifenesin     "Felt sick" and worsening of symptoms  . Nitrofuran Derivatives   . Prednisone     muscle weakness/pain  . Sulfa Antibiotics   . Tetracycline   . Azithromycin Rash    Severe rash with peeling  . Carvedilol Other (See Comments)    Dose problem with higher doses    Past Surgical History:  Procedure Laterality Date  . LAPAROSCOPIC HYSTERECTOMY  2001   total    Social History  Substance Use Topics  . Smoking status: Never Smoker  . Smokeless tobacco: Never Used  . Alcohol use No     Medication list has been reviewed and updated.   Physical Exam  Constitutional: She is oriented to person, place, and  time. She appears well-developed. No distress.  HENT:  Head: Normocephalic and atraumatic.  Cardiovascular: Normal rate, regular rhythm and normal heart sounds.   Pulmonary/Chest: Effort normal and breath sounds normal. No respiratory distress.  Musculoskeletal: Normal range of motion.  Neurological: She is alert and oriented to person, place, and time.  Skin: Skin is warm and dry. No rash noted.     Psychiatric: She has a normal mood and affect. Her behavior is  normal. Thought content normal.  Nursing note and vitals reviewed.   BP 140/78   Pulse 84   Ht 4\' 11"  (1.499 m)   Wt 114 lb (51.7 kg)   SpO2 99%   BMI 23.03 kg/m   Assessment and Plan: 1. Herpes zoster without complication Sample of Lyrica 50 mg bid x 10 days for pain Follow up with Ortho if hip pain persists - valACYclovir (VALTREX) 1000 MG tablet; Take 1 tablet (1,000 mg total) by mouth 3 (three) times daily.  Dispense: 21 tablet; Refill: 0   Meds ordered this encounter  Medications  . valACYclovir (VALTREX) 1000 MG tablet    Sig: Take 1 tablet (1,000 mg total) by mouth 3 (three) times daily.    Dispense:  21 tablet    Refill:  0    , MD [facility]  04/11/2017

## 2017-04-15 ENCOUNTER — Encounter: Payer: Medicare Other | Admitting: Physical Therapy

## 2017-04-17 ENCOUNTER — Encounter: Payer: Medicare Other | Admitting: Physical Therapy

## 2017-04-17 DIAGNOSIS — H16202 Unspecified keratoconjunctivitis, left eye: Secondary | ICD-10-CM | POA: Diagnosis not present

## 2017-04-22 ENCOUNTER — Encounter: Payer: Medicare Other | Admitting: Physical Therapy

## 2017-04-24 ENCOUNTER — Encounter: Payer: Medicare Other | Admitting: Physical Therapy

## 2017-04-25 ENCOUNTER — Telehealth: Payer: Self-pay

## 2017-04-25 NOTE — Telephone Encounter (Signed)
Patient called leaving a VM stating her shingles is worse. Still in pain but could not take the medication. States she is crippled and cannot walk. What should she do? Please Advise.

## 2017-04-25 NOTE — Telephone Encounter (Signed)
Spoke to pt about needing OV if need something more than aleve or ibuprofen for pain. She verbally understanding. Was supposed to get MRI for hip- but they decided to wait to see if shingles is the reason she is having hip pain. States she will wait it out and if needed will call Dr Judithann Graves for OV to discuss pain.

## 2017-04-25 NOTE — Telephone Encounter (Signed)
She should have called when she first knew that she could not take the medication.  It is too late now to take any more medication.  It will take longer to heal since she did not get valtrex.  She can take ibuprofen or aleve for pain.  If she needs something else for pain, she will need to be seen.

## 2017-05-01 ENCOUNTER — Ambulatory Visit
Admission: RE | Admit: 2017-05-01 | Discharge: 2017-05-01 | Payer: MEDICARE | Attending: Obstetrics & Gynecology | Admitting: Obstetrics & Gynecology

## 2017-05-01 DIAGNOSIS — N819 Female genital prolapse, unspecified: Secondary | ICD-10-CM

## 2017-05-01 DIAGNOSIS — N952 Postmenopausal atrophic vaginitis: Principal | ICD-10-CM

## 2017-05-01 DIAGNOSIS — Z6823 Body mass index (BMI) 23.0-23.9, adult: Secondary | ICD-10-CM | POA: Diagnosis not present

## 2017-05-05 DIAGNOSIS — M25551 Pain in right hip: Secondary | ICD-10-CM | POA: Diagnosis not present

## 2017-05-07 DIAGNOSIS — M8008XA Age-related osteoporosis with current pathological fracture, vertebra(e), initial encounter for fracture: Secondary | ICD-10-CM | POA: Diagnosis not present

## 2017-05-19 DIAGNOSIS — M84372A Stress fracture, left ankle, initial encounter for fracture: Secondary | ICD-10-CM | POA: Diagnosis not present

## 2017-05-19 DIAGNOSIS — M79672 Pain in left foot: Secondary | ICD-10-CM | POA: Diagnosis not present

## 2017-05-19 DIAGNOSIS — M25572 Pain in left ankle and joints of left foot: Secondary | ICD-10-CM | POA: Diagnosis not present

## 2017-06-12 ENCOUNTER — Ambulatory Visit: Admission: RE | Admit: 2017-06-12 | Discharge: 2017-06-12 | Payer: MEDICARE

## 2017-06-12 DIAGNOSIS — L089 Local infection of the skin and subcutaneous tissue, unspecified: Principal | ICD-10-CM

## 2017-06-12 DIAGNOSIS — B9689 Other specified bacterial agents as the cause of diseases classified elsewhere: Secondary | ICD-10-CM

## 2017-06-12 DIAGNOSIS — L2084 Intrinsic (allergic) eczema: Secondary | ICD-10-CM

## 2017-06-12 MED ORDER — CEPHALEXIN 500 MG CAPSULE
ORAL_CAPSULE | Freq: Four times a day (QID) | ORAL | 0 refills | 0 days | Status: CP
Start: 2017-06-12 — End: 2017-06-19

## 2017-06-17 DIAGNOSIS — M5416 Radiculopathy, lumbar region: Secondary | ICD-10-CM | POA: Diagnosis not present

## 2017-06-17 DIAGNOSIS — M5126 Other intervertebral disc displacement, lumbar region: Secondary | ICD-10-CM | POA: Diagnosis not present

## 2017-06-17 DIAGNOSIS — M8008XA Age-related osteoporosis with current pathological fracture, vertebra(e), initial encounter for fracture: Secondary | ICD-10-CM | POA: Diagnosis not present

## 2017-06-17 DIAGNOSIS — M5136 Other intervertebral disc degeneration, lumbar region: Secondary | ICD-10-CM | POA: Diagnosis not present

## 2017-07-01 DIAGNOSIS — R55 Syncope and collapse: Secondary | ICD-10-CM | POA: Diagnosis not present

## 2017-07-01 DIAGNOSIS — I5022 Chronic systolic (congestive) heart failure: Secondary | ICD-10-CM | POA: Diagnosis not present

## 2017-07-01 DIAGNOSIS — I10 Essential (primary) hypertension: Secondary | ICD-10-CM | POA: Diagnosis not present

## 2017-07-01 DIAGNOSIS — Z9889 Other specified postprocedural states: Secondary | ICD-10-CM | POA: Diagnosis not present

## 2017-07-02 DIAGNOSIS — M810 Age-related osteoporosis without current pathological fracture: Secondary | ICD-10-CM | POA: Diagnosis not present

## 2017-07-03 ENCOUNTER — Ambulatory Visit
Admission: RE | Admit: 2017-07-03 | Discharge: 2017-07-03 | Disposition: A | Discharge status: Home / Self Care | Payer: MEDICARE

## 2017-07-03 DIAGNOSIS — L2089 Other atopic dermatitis: Secondary | ICD-10-CM

## 2017-07-03 DIAGNOSIS — L089 Local infection of the skin and subcutaneous tissue, unspecified: Principal | ICD-10-CM

## 2017-07-03 DIAGNOSIS — B009 Herpesviral infection, unspecified: Secondary | ICD-10-CM

## 2017-07-03 MED ORDER — CEPHALEXIN 500 MG CAPSULE
ORAL_CAPSULE | Freq: Four times a day (QID) | ORAL | 0 refills | 0 days | Status: CP
Start: 2017-07-03 — End: 2017-07-13

## 2017-07-03 MED ORDER — VALACYCLOVIR 1 GRAM TABLET
ORAL_TABLET | Freq: Three times a day (TID) | ORAL | 0 refills | 0 days | Status: CP
Start: 2017-07-03 — End: 2017-07-03

## 2017-07-04 ENCOUNTER — Other Ambulatory Visit: Payer: Self-pay

## 2017-07-04 ENCOUNTER — Other Ambulatory Visit: Payer: Self-pay | Admitting: Internal Medicine

## 2017-07-04 DIAGNOSIS — E039 Hypothyroidism, unspecified: Secondary | ICD-10-CM

## 2017-07-04 MED ORDER — LEVOTHYROXINE SODIUM 25 MCG PO TABS: 25.0000 ug | ORAL_TABLET | Freq: Every day | ORAL | 3 refills | Status: DC

## 2017-07-04 MED ORDER — ACYCLOVIR 400 MG TABLET
ORAL_TABLET | 0 refills | 0 days | Status: CP
Start: 2017-07-04 — End: 2018-03-12

## 2017-07-04 NOTE — Progress Notes (Signed)
Patient called about thyroid meds refill. Tried calling back to inform she should have enough to get her through until January. Unable to reach patient and unable to leave VM. Phone kept ringing.

## 2017-07-09 MED ORDER — DOXYCYCLINE HYCLATE 100 MG TABLET
ORAL_TABLET | 0 refills | 0.00000 days | Status: CP
Start: 2017-07-09 — End: 2018-03-12

## 2017-07-21 ENCOUNTER — Ambulatory Visit
Admission: RE | Admit: 2017-07-21 | Discharge: 2017-07-21 | Disposition: A | Discharge status: Home / Self Care | Payer: MEDICARE | Attending: Obstetrics & Gynecology | Admitting: Obstetrics & Gynecology

## 2017-07-21 DIAGNOSIS — R399 Unspecified symptoms and signs involving the genitourinary system: Principal | ICD-10-CM

## 2017-07-21 DIAGNOSIS — Z6822 Body mass index (BMI) 22.0-22.9, adult: Secondary | ICD-10-CM | POA: Diagnosis not present

## 2017-07-24 MED ORDER — GENTAMICIN 60 MG/50 ML IN SODIUM CHLORIDE(ISO) INTRAVENOUS PIGGYBACK
4 refills | 0 days | Status: CP
Start: 2017-07-24 — End: 2018-03-12

## 2017-07-28 ENCOUNTER — Ambulatory Visit
Admission: RE | Admit: 2017-07-28 | Discharge: 2017-07-28 | Payer: MEDICARE | Attending: Obstetrics & Gynecology | Admitting: Obstetrics & Gynecology

## 2017-07-30 ENCOUNTER — Ambulatory Visit
Admission: RE | Admit: 2017-07-30 | Discharge: 2017-07-30 | Payer: MEDICARE | Attending: Obstetrics & Gynecology | Admitting: Obstetrics & Gynecology

## 2017-07-30 DIAGNOSIS — R399 Unspecified symptoms and signs involving the genitourinary system: Secondary | ICD-10-CM | POA: Diagnosis not present

## 2017-07-31 ENCOUNTER — Ambulatory Visit
Admission: RE | Admit: 2017-07-31 | Discharge: 2017-07-31 | Payer: MEDICARE | Attending: Obstetrics & Gynecology | Admitting: Obstetrics & Gynecology

## 2017-08-01 ENCOUNTER — Ambulatory Visit: Admission: RE | Admit: 2017-08-01 | Discharge: 2017-08-01 | Payer: MEDICARE

## 2017-08-11 DIAGNOSIS — M810 Age-related osteoporosis without current pathological fracture: Secondary | ICD-10-CM | POA: Diagnosis not present

## 2017-08-11 DIAGNOSIS — R0781 Pleurodynia: Secondary | ICD-10-CM | POA: Diagnosis not present

## 2017-08-14 ENCOUNTER — Ambulatory Visit
Admission: RE | Admit: 2017-08-14 | Discharge: 2017-08-14 | Disposition: A | Discharge status: Home / Self Care | Payer: MEDICARE | Attending: Obstetrics & Gynecology | Admitting: Obstetrics & Gynecology

## 2017-08-14 ENCOUNTER — Ambulatory Visit
Admission: RE | Admit: 2017-08-14 | Discharge: 2017-08-14 | Disposition: A | Discharge status: Home / Self Care | Payer: MEDICARE

## 2017-08-14 DIAGNOSIS — L2084 Intrinsic (allergic) eczema: Principal | ICD-10-CM

## 2017-08-14 DIAGNOSIS — R399 Unspecified symptoms and signs involving the genitourinary system: Principal | ICD-10-CM

## 2017-08-14 DIAGNOSIS — L2089 Other atopic dermatitis: Secondary | ICD-10-CM | POA: Diagnosis not present

## 2017-08-14 MED ORDER — CLOBETASOL 0.05 % TOPICAL OINTMENT
Freq: Two times a day (BID) | TOPICAL | 5 refills | 0.00000 days | Status: CP | PRN
Start: 2017-08-14 — End: 2018-01-29

## 2017-08-23 DIAGNOSIS — M109 Gout, unspecified: Secondary | ICD-10-CM | POA: Diagnosis not present

## 2017-08-27 ENCOUNTER — Ambulatory Visit
Admission: EM | Admit: 2017-08-27 | Discharge: 2017-08-27 | Disposition: A | Discharge status: Home / Self Care | Payer: Medicare Other | Attending: Family Medicine | Admitting: Family Medicine

## 2017-08-27 ENCOUNTER — Ambulatory Visit (INDEPENDENT_AMBULATORY_CARE_PROVIDER_SITE_OTHER): Payer: Medicare Other

## 2017-08-27 ENCOUNTER — Encounter: Payer: Self-pay | Admitting: Emergency Medicine

## 2017-08-27 ENCOUNTER — Other Ambulatory Visit: Payer: Self-pay

## 2017-08-27 DIAGNOSIS — M7989 Other specified soft tissue disorders: Secondary | ICD-10-CM | POA: Diagnosis not present

## 2017-08-27 DIAGNOSIS — M25571 Pain in right ankle and joints of right foot: Secondary | ICD-10-CM

## 2017-08-27 MED ORDER — MELOXICAM 7.5 MG PO TABS: 7.5000 mg | ORAL_TABLET | Freq: Every day | ORAL | 0 refills | Status: DC

## 2017-08-27 NOTE — ED Provider Notes (Signed)
[facility] ____________________________________________  Time seen: Approximately 12:15 PM  I have reviewed the triage vital signs and the nursing notes.   HISTORY  Chief Complaint Foot Pain (right)   HPI Katherine Baird is a 67 y.o. female with a past medical history of rheumatoid arthritis, osteoporosis with multiple previous stress fractures, presenting for evaluation of right foot pain that is been present for the last 1 week.  States that she went to urgent care in [place] this past Saturday and had an x-ray of her foot that did not show a fracture, and was given Indocin for concern of gout.  States that she took one Indocin tablet and it made her sick on her stomach which she has not since taken it again.  States that she has been taken aspirin at home for the pain which does help intermittently but no resolution.  States pain is mild at rest and described as an aching pain, worse with ambulation.  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  Denies pain radiation, paresthesias, redness, break in skin or drainage.  States this feels similar to past stress fractures that showed later on follow-up x-ray or MRI from podiatry.  Denies fall, direct injury or known trauma   To this area.  States that she called to get an appointment with podiatry and has an appointment for this coming Monday for the same complaints.  Reports otherwise feels well.  Denies other complaints.  Denies chest pain, shortness of breath, abdominal pain, or rash. Denies recent sickness. Denies recent antibiotic use.   Reubin Milan, MD: PCP   History reviewed. No pertinent past medical history.  Patient Active Problem List   Diagnosis Date Noted  . 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 Syncope 02/24/2017  . Hypothyroidism 05/24/2016  . HLD (hyperlipidemia) 01/27/2015  . Paroxysmal digital cyanosis 01/27/2015  . Connective tissue disease, undifferentiated  (HCC) 01/27/2015  . Chest wall pain 01/11/2015  . Essential (primary) hypertension 01/11/2015  . Rheumatoid arthritis involving multiple joints (HCC) 01/11/2015  . Diffuse connective tissue disease (HCC) 12/14/2014  . Chronic systolic heart failure (HCC) 07/12/2014  . 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 ABDOMINAL HYSTERECTOMY    . LAPAROSCOPIC HYSTERECTOMY  2001   total     No current facility-administered medications for this encounter.   Current Outpatient Medications:  .  carvedilol (COREG) 6.25 MG tablet, Take 1 tablet by mouth 2 (two) times daily., Disp: , Rfl:  .  clobetasol ointment (TEMOVATE) 0.05 %, , Disp: , Rfl: 0 .  levothyroxine (SYNTHROID, LEVOTHROID) 25 MCG tablet, Take 1 tablet (25 mcg total) daily by mouth., Disp: 90 tablet, Rfl: 3 .  pseudoephedrine (SUDAFED) 120 MG 12 hr tablet, Take 120 tablets by mouth once as needed., Disp: , Rfl:  .  cetirizine (ZYRTEC ALLERGY) 10 MG tablet, Take 1 tablet (10 mg total) by mouth daily., Disp: 30 tablet, Rfl: 5 .  meloxicam (MOBIC) 7.5 MG tablet, Take 1 tablet (7.5 mg total) by mouth daily., Disp: 7 tablet, Rfl: 0 .  neomycin-polymyxin-dexameth (MAXITROL) 0.1 % OINT, 1 application., Disp: , Rfl:   Allergies Levofloxacin; Doxycycline; Erythromycin; Gabapentin; Guaifenesin; Nitrofuran derivatives; Prednisone; Sulfa antibiotics; Tetracycline; Azithromycin; and Carvedilol  Family History  Problem Relation Age of Onset  . Arthritis Mother   . Dementia Mother   . Heart disease Father   . Mesothelioma Father   .  Kidney cancer Sister   . Heart disease Sister   . COPD Brother   . Arthritis Brother     Social History Social History   Tobacco Use  . Smoking status: Never Smoker  . Smokeless tobacco: Never Used  Substance Use Topics  . Alcohol use: No    Alcohol/week: 0.0 oz  . Drug use: No    Review of Systems Constitutional: No  fever/chills Cardiovascular: Denies chest pain. Respiratory: Denies shortness of breath. Gastrointestinal: No abdominal pain.   Musculoskeletal: Negative for back pain. As above.  Skin: Negative for rash.   ____________________________________________   PHYSICAL EXAM:  VITAL SIGNS: ED Triage Vitals  Enc Vitals Group     BP 08/27/17 1137 (!) 154/78     Pulse Rate 08/27/17 1137 71     Resp 08/27/17 1137 15     Temp 08/27/17 1137 97.8 F (36.6 C)     Temp Source 08/27/17 1137 Oral     SpO2 08/27/17 1137 100 %     Weight 08/27/17 1133 113 lb (51.3 kg)     Height 08/27/17 1133 4\' 11"  (1.499 m)     Head Circumference --      Peak Flow --      Pain Score 08/27/17 1133 9     Pain Loc --      Pain Edu? --      Excl. in GC? --     Constitutional: Alert and oriented. Well appearing and in no acute distress. Cardiovascular: Normal rate, regular rhythm. Grossly normal heart sounds.  Good peripheral circulation. Respiratory: Normal respiratory effort without tachypnea nor retractions. Breath sounds are clear and equal bilaterally. No wheezes, rales, rhonchi. Musculoskeletal: Mild antalgic gait. Bilateral dosalis pedis and posterior tibialis pulses equal and easily palpated. Left lower extremity nontender and no edema.  Except: Right lateral and posterior ankle mild to moderate tenderness to palpation, mild to moderate tenderness at plantar and posterior calcaneus with mild tenderness at the calcaneal insertion of Achilles, otherwise Achilles nontender, mild pain with dorsiflexion and plantarflexion resisted, full range of motion present, right foot nontender, normal distal sensation and capillary refill, no erythema, skin intact, mild to moderate localized ankle edema, right lower extremity otherwise nontender, no calf tenderness.  Neurologic:  Normal speech and language. No gross focal neurologic deficits are appreciated. Speech is normal. No gait instability.  Skin:  Skin is warm, dry and  intact. No rash noted. Psychiatric: Mood and affect are normal. Speech and behavior are normal. Patient exhibits appropriate insight and judgment   ___________________________________________   LABS (all labs ordered are listed, but only abnormal results are displayed)  Labs Reviewed - No data to display  RADIOLOGY  Dg Ankle Complete Right  Result Date: 08/27/2017 CLINICAL DATA:  Pain and swelling EXAM: RIGHT ANKLE - COMPLETE 3+ VIEW COMPARISON:  none FINDINGS: Negative for fracture or arthropathy. Normal alignment. Diffuse soft tissue swelling IMPRESSION: Diffuse soft tissue swelling without acute bony abnormality. Electronically Signed   By: 10/25/2017 M.D.   On: 08/27/2017 12:43   ____________________________________________   PROCEDURES Procedures    INITIAL IMPRESSION / ASSESSMENT AND PLAN / ED COURSE  Pertinent labs & imaging results that were available during my care of the patient were reviewed by me and considered in my medical decision making (see chart for details).  Well-appearing patient.  No acute distress.  Patient with long-standing history of rheumatoid arthritis, osteoporosis and multiple bony complaints.  Denies known trigger or trauma, patient states consistent  with previous stress fractures that did not show initially.  Discussed with patient concern for stress fracture versus tendinitis.  No appearance of secondary infection.  Will evaluate ankle films, xray results above, negative per radiologist.  With  place in postoperative shoe, Ace bandage and follow-up with podiatry as scheduled. PRN 7.5 mg mobic. Discussed indication, risks and benefits of medications with patient.   [place] controlled substance database reviewed for last one year, and last medication 08/24/17 #25 tramadol, 07/23/2017 #25 tramadol. As regarding this medication, and patient reports she does still have at home as needed.    Discussed follow up with Primary care physician this  week. Discussed follow up and return parameters including no resolution or any worsening concerns. Patient verbalized understanding and agreed to plan.   ____________________________________________   FINAL CLINICAL IMPRESSION(S) / ED DIAGNOSES  Final diagnoses:  Acute right ankle pain     ED Discharge Orders        Ordered    meloxicam (MOBIC) 7.5 MG tablet  Daily     08/27/17 1302       Note: This dictation was prepared with Dragon dictation along with smaller phrase technology. Any transcriptional errors that result from this process are unintentional.           Renford Dills, NP 08/27/17 1405

## 2017-08-27 NOTE — Discharge Instructions (Signed)
Take medication as prescribed. Rest. Drink plenty of fluids. Elevate and rest. Use ACE wrap and post operative shoe.   Follow up with podiatry this week as scheduled.   Follow up with your primary care physician this week as needed. Return to Urgent care for new or worsening concerns.

## 2017-08-27 NOTE — ED Triage Notes (Signed)
Patient c/o pain in her right foot that started a week ago.  Patient states that she went to an urgent care in [place] and her foot was x-rayed and patient states they did not see a fracture.  Patient states that she was given medicine for gout.  Patient states that she only took once dose and the medicine "made me sick"

## 2017-08-28 ENCOUNTER — Encounter
Admit: 2017-08-28 | Discharge: 2017-08-29 | Payer: MEDICARE | Attending: Obstetrics & Gynecology | Primary: Obstetrics & Gynecology

## 2017-08-28 DIAGNOSIS — R350 Frequency of micturition: Principal | ICD-10-CM

## 2017-08-28 DIAGNOSIS — Z6823 Body mass index (BMI) 23.0-23.9, adult: Secondary | ICD-10-CM | POA: Diagnosis not present

## 2017-09-01 DIAGNOSIS — M84374A Stress fracture, right foot, initial encounter for fracture: Secondary | ICD-10-CM | POA: Diagnosis not present

## 2017-09-01 DIAGNOSIS — M79671 Pain in right foot: Secondary | ICD-10-CM | POA: Diagnosis not present

## 2017-09-01 LAB — CBC AND DIFFERENTIAL
HEMATOCRIT: 30 — AB (ref 36–46)
HEMOGLOBIN: 9 — AB (ref 12.0–16.0)
PLATELETS: 347 (ref 150–399)
WBC: 5.3

## 2017-09-17 DIAGNOSIS — M79671 Pain in right foot: Secondary | ICD-10-CM | POA: Diagnosis not present

## 2017-09-19 ENCOUNTER — Encounter: Payer: Self-pay | Admitting: Internal Medicine

## 2017-09-19 ENCOUNTER — Ambulatory Visit (INDEPENDENT_AMBULATORY_CARE_PROVIDER_SITE_OTHER): Payer: Medicare Other | Admitting: Internal Medicine

## 2017-09-19 ENCOUNTER — Other Ambulatory Visit: Payer: Self-pay | Admitting: Internal Medicine

## 2017-09-19 VITALS — BP 142/86 | HR 78 | Ht 59.0 in | Wt 115.0 lb

## 2017-09-19 DIAGNOSIS — N3001 Acute cystitis with hematuria: Secondary | ICD-10-CM

## 2017-09-19 LAB — POC URINALYSIS WITH MICROSCOPIC (NON AUTO)MANUAL RESULT
Bilirubin, UA: NEGATIVE
CRYSTALS: 0
Glucose, UA: NEGATIVE
KETONES UA: NEGATIVE
MUCUS UA: 0
NITRITE UA: NEGATIVE
PH UA: 6 (ref 5.0–8.0)
PROTEIN UA: 100
RBC: 2 M/uL — AB (ref 4.04–5.48)
Spec Grav, UA: 1.015 (ref 1.010–1.025)
Urobilinogen, UA: 0.2 E.U./dL

## 2017-09-19 NOTE — Progress Notes (Signed)
Date:  09/19/2017   Name:  Katherine Baird   DOB:  Jan 21, 1951   MRN:  [id number]   Chief Complaint: Urinary Tract Infection (Burning during urination and vaginal area feels raw.)  Urinary Tract Infection   This is a new problem. The problem occurs every urination. The quality of the pain is described as burning. The pain is at a severity of 1/10. The pain is mild. There has been no fever. Associated symptoms include frequency and urgency. Pertinent negatives include no chills, discharge or flank pain. She has tried antibiotics (last culture staph at [facility]) for the symptoms.   She has multiple drug allergies and reports that the last time she had MSSA UTI she was treated with intravesicular antibiotics.  She has not contacted her [facility]RO-gyn yet.   Review of Systems  Constitutional: Negative for chills and fever.  Respiratory: Negative for chest tightness and shortness of breath.   Cardiovascular: Negative for chest pain and palpitations.  Genitourinary: Positive for dysuria, frequency and urgency. Negative for flank pain.    Patient Active Problem List   Diagnosis Date Noted  . 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 Syncope 02/24/2017  . Hypothyroidism 05/24/2016  . HLD (hyperlipidemia) 01/27/2015  . Paroxysmal digital cyanosis 01/27/2015  . 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 Chest wall pain 01/11/2015  . Essential (primary) hypertension 01/11/2015  . Rheumatoid arthritis involving multiple joints (HCC) 01/11/2015  . Diffuse connective tissue disease (HCC) 12/14/2014  . Chronic systolic heart failure (HCC) 07/12/2014  . 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 Authorizing Provider  carvedilol (COREG) 6.25 MG tablet Take 1 tablet by mouth 2 (two) times daily.   Yes [provider]  cetirizine (ZYRTEC ALLERGY) 10 MG tablet Take 1 tablet (10 mg total) by mouth daily.  02/24/17  Yes Reubin Milan, MD  clobetasol ointment (TEMOVATE) 0.05 %  04/27/15  Yes [provider]  levothyroxine (SYNTHROID, LEVOTHROID) 25 MCG tablet Take 1 tablet (25 mcg total) daily by mouth. 07/04/17  Yes Reubin Milan, MD  meloxicam (MOBIC) 7.5 MG tablet Take 1 tablet (7.5 mg total) by mouth daily. 08/27/17  Yes Renford Dills, NP  neomycin-polymyxin-dexameth (MAXITROL) 0.1 % OINT 1 application.   Yes [provider]  pseudoephedrine (SUDAFED) 120 MG 12 hr tablet Take 120 tablets by mouth once as needed. 02/16/08  Yes [provider]    Allergies  Allergen Reactions  . Levofloxacin Nausea And Vomiting  . Doxycycline   . Erythromycin   . Gabapentin   . Guaifenesin     "Felt sick" and worsening of symptoms  . Nitrofuran Derivatives   . Prednisone     muscle weakness/pain  . Sulfa Antibiotics   . Tetracycline   . Azithromycin Rash    Severe rash with peeling  . Carvedilol Other (See Comments)    Dose problem with higher doses    Past Surgical History:  Procedure Laterality Date  . ABDOMINAL HYSTERECTOMY    . LAPAROSCOPIC HYSTERECTOMY  2001   total    Social History   Tobacco Use  . Smoking status: Never Smoker  . Smokeless tobacco: Never Used  Substance Use Topics  . Alcohol use: No    Alcohol/week: 0.0 oz  . Drug use: No     Medication list has been reviewed and updated.  PHQ 2/9 Scores 09/19/2017 05/24/2016  PHQ - 2 Score  0 0    Physical Exam  Constitutional: She is oriented to person, place, and time. She appears well-developed. No distress.  HENT:  Head: Normocephalic and atraumatic.  Cardiovascular: Normal rate, regular rhythm and normal heart sounds.  Pulmonary/Chest: Effort normal and breath sounds normal. No respiratory distress. She has no wheezes.  Abdominal: Soft. There is no tenderness. There is no rigidity, no guarding and no CVA tenderness.  Neurological: She is alert and oriented to person, place, and time.    Skin: Skin is warm and dry. No rash noted.  Psychiatric: She has a normal mood and affect. Her behavior is normal. Thought content normal.  Nursing note and vitals reviewed.   BP (!) 142/86   Pulse 78   Ht 4\' 11"  (1.499 m)   Wt 115 lb (52.2 kg)   SpO2 100%   BMI 23.23 kg/m   Assessment and Plan: 1. Acute cystitis with hematuria Hold on antibiotics for now pending cx Pt to contact Urogyn for follow up next week - POC urinalysis w microscopic (non auto) - Urine Culture   No orders of the defined types were placed in this encounter.   Partially dictated using . Any errors are unintentional.  Animal nutritionist, MD [facility]  09/19/2017

## 2017-09-23 ENCOUNTER — Other Ambulatory Visit: Payer: Self-pay | Admitting: Internal Medicine

## 2017-09-23 DIAGNOSIS — N3000 Acute cystitis without hematuria: Secondary | ICD-10-CM

## 2017-09-23 DIAGNOSIS — S86811A Strain of other muscle(s) and tendon(s) at lower leg level, right leg, initial encounter: Secondary | ICD-10-CM | POA: Diagnosis not present

## 2017-09-23 DIAGNOSIS — S8001XA Contusion of right knee, initial encounter: Secondary | ICD-10-CM | POA: Diagnosis not present

## 2017-09-23 LAB — URINE CULTURE

## 2017-09-23 MED ORDER — CEFUROXIME AXETIL 500 MG PO TABS: 500.0000 mg | ORAL_TABLET | Freq: Two times a day (BID) | ORAL | 0 refills | Status: AC

## 2017-10-01 DIAGNOSIS — M79671 Pain in right foot: Secondary | ICD-10-CM | POA: Diagnosis not present

## 2017-10-16 DIAGNOSIS — I10 Essential (primary) hypertension: Secondary | ICD-10-CM | POA: Diagnosis not present

## 2017-10-22 DIAGNOSIS — M810 Age-related osteoporosis without current pathological fracture: Secondary | ICD-10-CM | POA: Diagnosis not present

## 2017-10-22 DIAGNOSIS — M1990 Unspecified osteoarthritis, unspecified site: Secondary | ICD-10-CM | POA: Diagnosis not present

## 2017-10-22 DIAGNOSIS — M359 Systemic involvement of connective tissue, unspecified: Secondary | ICD-10-CM | POA: Diagnosis not present

## 2017-10-23 DIAGNOSIS — M059 Rheumatoid arthritis with rheumatoid factor, unspecified: Secondary | ICD-10-CM | POA: Diagnosis not present

## 2017-10-27 DIAGNOSIS — M79672 Pain in left foot: Secondary | ICD-10-CM | POA: Diagnosis not present

## 2017-11-17 DIAGNOSIS — M79672 Pain in left foot: Secondary | ICD-10-CM | POA: Diagnosis not present

## 2017-11-17 DIAGNOSIS — M84375D Stress fracture, left foot, subsequent encounter for fracture with routine healing: Secondary | ICD-10-CM | POA: Diagnosis not present

## 2017-12-16 NOTE — Progress Notes (Signed)
Patient ID: Katherine Baird, female    DOB: 1951/06/28, 66 y.o.   MRN: [id number]  HPI  Katherine Baird is a 67 y/o female with a history of osteoporosis (multiple spontaneous fractures), HTN and chronic heart failure.   Echo report from 10/30/16 reviewed and showed an EF of 50% along with mild MR/TR.  Was at Urgent Care 08/27/17 due to right ankle pain where she was treated and released.   She presents today for her initial visit with a chief complaint of moderate fatigue upon minimal exertion. She says this has been chronic in nature over the last few years but does appear to be worsening over the last few months or so. Admits to being under a lot of stress with taking care of her mom who has alzheimer's and her husband died a few years ago. She has associated head congestion, edema, light-headedness, depression and difficulty sleeping along with this. She denies any abdominal distention, palpitations, chest pain, shortness of breath, cough or weight gain.   Past Medical History:  Diagnosis Date  . CHF (congestive heart failure) (HCC)   . Hypertension   . Osteoporosis    Past Surgical History:  Procedure Laterality Date  . ABDOMINAL HYSTERECTOMY    . LAPAROSCOPIC HYSTERECTOMY  2001   total   Family History  Problem Relation Age of Onset  . Arthritis Mother   . Dementia Mother   . Heart disease Father   . Mesothelioma Father   . Kidney cancer Sister   . Heart disease Sister   . COPD Brother   . Arthritis Brother    Social History   Tobacco Use  . Smoking status: Never Smoker  . Smokeless tobacco: Never Used  Substance Use Topics  . Alcohol use: No    Alcohol/week: 0.0 oz   Allergies  Allergen Reactions  . Levofloxacin Nausea And Vomiting  . Doxycycline   . Erythromycin   . Gabapentin   . Guaifenesin     "Felt sick" and worsening of symptoms  . Nitrofuran Derivatives   . Prednisone     muscle weakness/pain  . Sulfa Antibiotics   . Tetracycline   . Azithromycin Rash     Severe rash with peeling  . Carvedilol Other (See Comments)    Dose problem with higher doses   Prior to Admission medications   Medication Sig Start Date End Date Taking? Authorizing Provider  carvedilol (COREG) 6.25 MG tablet Take 1 tablet by mouth 2 (two) times daily.   Yes [provider]  levothyroxine (SYNTHROID, LEVOTHROID) 25 MCG tablet Take 1 tablet (25 mcg total) daily by mouth. 07/04/17  Yes Reubin Milan, MD  pseudoephedrine (SUDAFED) 120 MG 12 hr tablet Take 120 tablets by mouth once as needed. 02/16/08  Yes [provider]    Review of Systems  Constitutional: Positive for fatigue. Negative for appetite change.  HENT: Positive for congestion and rhinorrhea. Negative for sore throat.   Eyes: Negative.   Respiratory: Negative for chest tightness and shortness of breath.   Cardiovascular: Positive for leg swelling. Negative for chest pain and palpitations.  Gastrointestinal: Negative for abdominal distention and abdominal pain.  Endocrine: Negative.   Genitourinary: Negative.   Musculoskeletal: Positive for arthralgias (right lower leg). Negative for back pain.  Skin: Negative.   Allergic/Immunologic: Negative.   Neurological: Positive for light-headedness. Negative for dizziness.  Hematological: Negative for adenopathy. Does not bruise/bleed easily.  Psychiatric/Behavioral: Positive for dysphoric mood (at times) and sleep disturbance (due to  staying with mom with alzheimer's). The patient is not nervous/anxious.     Vitals:   12/17/17 1135  BP: (!) 144/75  Resp: 20  Temp: 97.6 F (36.4 C)  TempSrc: Oral  SpO2: 100%  Weight: 108 lb 6.4 oz (49.2 kg)  Height: 4\' 11"  (1.499 m)   Wt Readings from Last 3 Encounters:  12/17/17 108 lb 6.4 oz (49.2 kg)  09/19/17 115 lb (52.2 kg)  08/27/17 113 lb (51.3 kg)   Lab Results  Component Value Date   CREATININE 0.66 07/29/2016   CREATININE 0.7 10/31/2014   CREATININE 0.70 08/11/2013    Physical Exam   Constitutional: She is oriented to person, place, and time. She appears well-developed and well-nourished.  HENT:  Head: Normocephalic and atraumatic.  Neck: Normal range of motion. Neck supple. No JVD present.  Cardiovascular: Regular rhythm and normal heart sounds.  Pulmonary/Chest: Effort normal. She has no wheezes. She has no rales.  Abdominal: Soft. She exhibits no distension. There is no tenderness.  Musculoskeletal: She exhibits edema (1+ pitting edema in right lower leg). She exhibits no tenderness.  Neurological: She is alert and oriented to person, place, and time.  Skin: Skin is warm and dry.  Psychiatric: She has a normal mood and affect. Her behavior is normal. Thought content normal.  Nursing note and vitals reviewed.   Assessment & Plan:  1: Chronic heart failure with preserved ejection fraction- - NYHA class III - mildly fluid overloaded with edema in right lower leg - encouraged to weigh daily and to call for an overnight weight gain of >2 pounds or a weekly weight gain of >5 pounds - not adding salt nor does she cook with salt. Reviewed the importance of closely following a 2000mg  sodium diet and written dietary information was given to her about this - instructed to get compression socks to wear daily with removal at bedtime; may need PRN furosemide if edema persists - encouraged her to get rest when she can (taking care of mom who has alzheimer's) - discussed getting echocardiogram repeated to see if her heart function has declined - saw cardiology 08/13/2013) 10/16/17  2: HTN- - BP looks good today - saw PCP Gwen Pounds) 09/19/17 & returns 12/18/17 - BMP from 03/04/17 reviewed and showed sodium 141, potassium 4.2 and GFR 72  Patient did not bring her medications nor a list. Each medication was verbally reviewed with the patient and she was encouraged to bring the bottles to every visit to confirm accuracy of list.  Return in 1 month or sooner for any  questions/problems before then.

## 2017-12-17 ENCOUNTER — Other Ambulatory Visit: Payer: Self-pay

## 2017-12-17 ENCOUNTER — Encounter: Payer: Self-pay | Admitting: Family

## 2017-12-17 ENCOUNTER — Ambulatory Visit: Payer: Medicare Other | Attending: Family | Admitting: Family

## 2017-12-17 VITALS — BP 144/75 | Temp 97.6°F | Resp 20 | Ht 59.0 in | Wt 108.4 lb

## 2017-12-17 DIAGNOSIS — I5032 Chronic diastolic (congestive) heart failure: Secondary | ICD-10-CM | POA: Insufficient documentation

## 2017-12-17 DIAGNOSIS — Z881 Allergy status to other antibiotic agents status: Secondary | ICD-10-CM | POA: Insufficient documentation

## 2017-12-17 DIAGNOSIS — Z79899 Other long term (current) drug therapy: Secondary | ICD-10-CM | POA: Diagnosis not present

## 2017-12-17 DIAGNOSIS — I10 Essential (primary) hypertension: Secondary | ICD-10-CM

## 2017-12-17 DIAGNOSIS — I110 Hypertensive heart disease with heart failure: Secondary | ICD-10-CM | POA: Insufficient documentation

## 2017-12-17 NOTE — Patient Instructions (Addendum)
Continue weighing daily and call for an overnight weight gain of > 2 pounds or a weekly weight gain of >5 pounds.  Begin wearing compression socks daily.

## 2017-12-18 ENCOUNTER — Encounter: Payer: Self-pay | Admitting: Internal Medicine

## 2017-12-18 ENCOUNTER — Ambulatory Visit: Payer: Medicare Other | Admitting: Internal Medicine

## 2017-12-18 VITALS — BP 136/70 | HR 70 | Ht 59.0 in | Wt 108.0 lb

## 2017-12-18 DIAGNOSIS — F5101 Primary insomnia: Secondary | ICD-10-CM | POA: Diagnosis not present

## 2017-12-18 DIAGNOSIS — E034 Atrophy of thyroid (acquired): Secondary | ICD-10-CM

## 2017-12-18 DIAGNOSIS — D649 Anemia, unspecified: Secondary | ICD-10-CM

## 2017-12-18 DIAGNOSIS — I5032 Chronic diastolic (congestive) heart failure: Secondary | ICD-10-CM | POA: Diagnosis not present

## 2017-12-18 NOTE — Patient Instructions (Signed)
Melatonin 5-10 mg 30 minutes before bedtime.

## 2017-12-18 NOTE — Progress Notes (Signed)
Date:  12/18/2017   Name:  Katherine Baird   DOB:  05-13-1951   MRN:  [id number]   Chief Complaint: Fatigue (Have not felt good in several months. Getting worse. Can't get up and get out of the house and do things that need to be done. Never feel rested. Had anemia in past and wondering if its back.)  Thyroid Problem  Presents for follow-up visit. Symptoms include fatigue and leg swelling. Patient reports no anxiety, constipation or palpitations. The symptoms have been stable.  Insomnia  Primary symptoms: sleep disturbance, difficulty falling asleep, frequent awakening.  The onset quality is undetermined. The problem occurs nightly.  Anemia  Presents for follow-up visit. There has been no abdominal pain, fever or palpitations.   Lab Results  Component Value Date   TSH 1.880 05/24/2016   Lab Results  Component Value Date   WBC 5.3 09/01/2017   HGB 9.0 (A) 09/01/2017   HCT 30 (A) 09/01/2017   MCV 83 05/24/2016   PLT 347 09/01/2017     Review of Systems  Constitutional: Positive for fatigue. Negative for chills and fever.  Respiratory: Positive for shortness of breath. Negative for chest tightness and wheezing.   Cardiovascular: Positive for leg swelling. Negative for chest pain and palpitations.  Gastrointestinal: Negative for abdominal pain and constipation.  Skin: Positive for rash.  Neurological: Negative for dizziness and headaches.  Psychiatric/Behavioral: Positive for sleep disturbance. Negative for suicidal ideas. The patient has insomnia. The patient is not nervous/anxious.     Patient Active Problem List   Diagnosis Date Noted  . Chronic diastolic heart failure (HCC) 12/17/2017  . 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 Syncope 02/24/2017  . Hypothyroidism 05/24/2016  . HLD (hyperlipidemia) 01/27/2015  . Paroxysmal digital cyanosis 01/27/2015  . 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 Essential (primary) hypertension 01/11/2015  . Rheumatoid  arthritis involving multiple joints (HCC) 01/11/2015  . Diffuse connective tissue disease (HCC) 12/14/2014  . 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 Authorizing Provider  carvedilol (COREG) 6.25 MG tablet Take 1 tablet by mouth 2 (two) times daily.   Yes [provider]  levothyroxine (SYNTHROID, LEVOTHROID) 25 MCG tablet Take 1 tablet (25 mcg total) daily by mouth. 07/04/17  Yes Reubin Milan, MD  pseudoephedrine (SUDAFED) 120 MG 12 hr tablet Take 120 tablets by mouth once as needed. 02/16/08  Yes [provider]    Allergies  Allergen Reactions  . Levofloxacin Nausea And Vomiting  . Doxycycline   . Erythromycin   . Gabapentin   . Guaifenesin     "Felt sick" and worsening of symptoms  . Nitrofuran Derivatives   . Prednisone     muscle weakness/pain  . Sulfa Antibiotics   . Tetracycline   . Azithromycin Rash    Severe rash with peeling  . Carvedilol Other (See Comments)    Dose problem with higher doses    Past Surgical History:  Procedure Laterality Date  . ABDOMINAL HYSTERECTOMY    . LAPAROSCOPIC HYSTERECTOMY  2001   total    Social History   Tobacco Use  . Smoking status: Never Smoker  . Smokeless tobacco: Never Used  Substance Use Topics  . Alcohol use: No    Alcohol/week: 0.0 oz  . Drug use: No     Medication list has been reviewed and updated.  PHQ 2/9 Scores 09/19/2017 05/24/2016  PHQ -  2 Score 0 0    Physical Exam  Constitutional: She is oriented to person, place, and time. She appears well-developed. No distress.  HENT:  Head: Normocephalic and atraumatic.  Neck: Normal range of motion. Neck supple.  Cardiovascular: Normal rate, regular rhythm and normal heart sounds.  Pulmonary/Chest: Effort normal and breath sounds normal. No respiratory distress.  Musculoskeletal: She exhibits edema.  Neurological: She is alert and oriented to  person, place, and time.  Skin: Skin is warm and dry. No rash noted.  Psychiatric: She has a normal mood and affect. Her behavior is normal. Thought content normal.    BP 136/70   Pulse 70   Ht 4\' 11"  (1.499 m)   Wt 108 lb (49 kg)   SpO2 100%   BMI 21.81 kg/m   Assessment and Plan: 1. Hypothyroidism due to acquired atrophy of thyroid Check labs - TSH  2. Chronic diastolic heart failure (HCC) Continue Coreg and follow up with cardiology - Comprehensive metabolic panel  3. Primary insomnia Trial of Melatonin  4. Anemia, unspecified type Check labs - CBC with Differential/Platelet - Fe+TIBC+Fer   No orders of the defined types were placed in this encounter.   Partially dictated using . Any errors are unintentional.  Animal nutritionist, MD [facility]  12/18/2017

## 2017-12-19 LAB — COMPREHENSIVE METABOLIC PANEL
A/G RATIO: 1.5 (ref 1.2–2.2)
ALT: 6 IU/L (ref 0–32)
AST: 18 IU/L (ref 0–40)
Albumin: 4.6 g/dL (ref 3.6–4.8)
Alkaline Phosphatase: 106 IU/L (ref 39–117)
BUN/Creatinine Ratio: 12 (ref 12–28)
BUN: 11 mg/dL (ref 8–27)
Bilirubin Total: 0.4 mg/dL (ref 0.0–1.2)
CALCIUM: 9.5 mg/dL (ref 8.7–10.3)
CO2: 25 mmol/L (ref 20–29)
CREATININE: 0.89 mg/dL (ref 0.57–1.00)
Chloride: 106 mmol/L (ref 96–106)
GFR calc Af Amer: 78 mL/min/{1.73_m2} (ref >59)
GFR, EST NON AFRICAN AMERICAN: 68 mL/min/{1.73_m2} (ref >59)
Globulin, Total: 3 g/dL (ref 1.5–4.5)
Glucose: 98 mg/dL (ref 65–99)
Potassium: 4.3 mmol/L (ref 3.5–5.2)
Sodium: 143 mmol/L (ref 134–144)
TOTAL PROTEIN: 7.6 g/dL (ref 6.0–8.5)

## 2017-12-19 LAB — CBC WITH DIFFERENTIAL/PLATELET
Basophils Absolute: 0 10*3/uL (ref 0.0–0.2)
Basos: 0 %
EOS (ABSOLUTE): 0.4 10*3/uL (ref 0.0–0.4)
EOS: 7 %
HEMATOCRIT: 30.5 % — AB (ref 34.0–46.6)
HEMOGLOBIN: 10.1 g/dL — AB (ref 11.1–15.9)
IMMATURE GRANS (ABS): 0 10*3/uL (ref 0.0–0.1)
IMMATURE GRANULOCYTES: 0 %
LYMPHS: 25 %
Lymphocytes Absolute: 1.3 10*3/uL (ref 0.7–3.1)
MCH: 24.9 pg — ABNORMAL LOW (ref 26.6–33.0)
MCHC: 33.1 g/dL (ref 31.5–35.7)
MCV: 75 fL — AB (ref 79–97)
MONOCYTES: 9 %
Monocytes Absolute: 0.5 10*3/uL (ref 0.1–0.9)
NEUTROS PCT: 59 %
Neutrophils Absolute: 3.1 10*3/uL (ref 1.4–7.0)
Platelets: 334 10*3/uL (ref 150–379)
RBC: 4.05 x10E6/uL (ref 3.77–5.28)
RDW: 16.9 % — AB (ref 12.3–15.4)
WBC: 5.3 10*3/uL (ref 3.4–10.8)

## 2017-12-19 LAB — TSH: TSH: 3.08 u[IU]/mL (ref 0.450–4.500)

## 2017-12-19 LAB — IRON,TIBC AND FERRITIN PANEL
FERRITIN: 10 ng/mL — AB (ref 15–150)
IRON SATURATION: 9 % — AB (ref 15–55)
IRON: 32 ug/dL (ref 27–139)
Total Iron Binding Capacity: 354 ug/dL (ref 250–450)
UIBC: 322 ug/dL (ref 118–369)

## 2017-12-22 DIAGNOSIS — M545 Low back pain: Secondary | ICD-10-CM | POA: Diagnosis not present

## 2017-12-22 DIAGNOSIS — M810 Age-related osteoporosis without current pathological fracture: Secondary | ICD-10-CM | POA: Diagnosis not present

## 2017-12-22 DIAGNOSIS — G8929 Other chronic pain: Secondary | ICD-10-CM | POA: Diagnosis not present

## 2017-12-22 DIAGNOSIS — M1990 Unspecified osteoarthritis, unspecified site: Secondary | ICD-10-CM | POA: Diagnosis not present

## 2017-12-30 ENCOUNTER — Ambulatory Visit (INDEPENDENT_AMBULATORY_CARE_PROVIDER_SITE_OTHER): Payer: Medicare Other | Admitting: Internal Medicine

## 2017-12-30 ENCOUNTER — Encounter: Payer: Self-pay | Admitting: Internal Medicine

## 2017-12-30 VITALS — BP 122/79 | HR 79 | Resp 16 | Ht 59.0 in | Wt 110.0 lb

## 2017-12-30 DIAGNOSIS — B001 Herpesviral vesicular dermatitis: Secondary | ICD-10-CM | POA: Diagnosis not present

## 2017-12-30 DIAGNOSIS — W57XXXA Bitten or stung by nonvenomous insect and other nonvenomous arthropods, initial encounter: Secondary | ICD-10-CM

## 2017-12-30 DIAGNOSIS — S30861A Insect bite (nonvenomous) of abdominal wall, initial encounter: Secondary | ICD-10-CM

## 2017-12-30 MED ORDER — ACYCLOVIR 5 % EX OINT: 1.0000 "application " | TOPICAL_OINTMENT | CUTANEOUS | 0 refills | Status: DC

## 2017-12-30 NOTE — Progress Notes (Signed)
Date:  12/30/2017   Name:  Katherine Baird   DOB:  04/07/1951   MRN:  [id number]   Chief Complaint: Rash (Had tick bite about 10-12 days ago and then another one on Friday and now covered in rash all over. )  Rash  This is a new problem. The current episode started in the past 7 days. The problem has been waxing and waning since onset. The rash is diffuse. The rash is characterized by redness and itchiness. Associated with: tick bite 10 and 4 days ago. Pertinent negatives include no diarrhea, fatigue, fever, shortness of breath or vomiting.   She has several different rashes and has mixed CTD with skin changes.  Review of Systems  Constitutional: Negative for fatigue and fever.  Eyes: Negative for visual disturbance.  Respiratory: Negative for chest tightness and shortness of breath.   Cardiovascular: Negative for chest pain and palpitations.  Gastrointestinal: Negative for diarrhea and vomiting.  Skin: Positive for rash.  Neurological: Negative for dizziness and headaches.    Patient Active Problem List   Diagnosis Date Noted  . Chronic diastolic heart failure (HCC) 12/17/2017  . 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 Syncope 02/24/2017  . Hypothyroidism 05/24/2016  . HLD (hyperlipidemia) 01/27/2015  . Paroxysmal digital cyanosis 01/27/2015  . 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 Essential (primary) hypertension 01/11/2015  . Rheumatoid arthritis involving multiple joints (HCC) 01/11/2015  . 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 Authorizing Provider  carvedilol (COREG) 6.25 MG tablet Take 1 tablet by mouth 2 (two) times daily.   Yes [provider]  levothyroxine (SYNTHROID, LEVOTHROID) 25 MCG tablet Take 1 tablet (25 mcg total) daily by mouth. 07/04/17  Yes Reubin Milan, MD    Allergies  Allergen Reactions  . Levofloxacin Nausea And Vomiting  .  Doxycycline   . Erythromycin   . Gabapentin   . Guaifenesin     "Felt sick" and worsening of symptoms  . Nitrofuran Derivatives   . Prednisone     muscle weakness/pain  . Sulfa Antibiotics   . Tetracycline   . Azithromycin Rash    Severe rash with peeling  . Carvedilol Other (See Comments)    Dose problem with higher doses    Past Surgical History:  Procedure Laterality Date  . ABDOMINAL HYSTERECTOMY    . LAPAROSCOPIC HYSTERECTOMY  2001   total    Social History   Tobacco Use  . Smoking status: Never Smoker  . Smokeless tobacco: Never Used  Substance Use Topics  . Alcohol use: No    Alcohol/week: 0.0 oz  . Drug use: No     Medication list has been reviewed and updated.  PHQ 2/9 Scores 09/19/2017 05/24/2016  PHQ - 2 Score 0 0    Physical Exam  Constitutional: She is oriented to person, place, and time. She appears well-developed. No distress.  HENT:  Head: Normocephalic and atraumatic.  Cardiovascular: Normal rate, regular rhythm and normal heart sounds.  Pulmonary/Chest: Effort normal and breath sounds normal. No respiratory distress.  Musculoskeletal: Normal range of motion.  Neurological: She is alert and oriented to person, place, and time.  Skin: Skin is warm and dry. No rash noted.  On chin - vesicular patch c/w HSV  On forehead - diffuse pink dry rash  On antecubital fossae - dry macular cluster of lesions  On forearm - single raised pink  papule  Tick bite site on right flank - tiny pink macule - no surrounding erythema or target lesioin  Psychiatric: She has a normal mood and affect. Her behavior is normal. Thought content normal.    BP 122/79   Pulse 79   Resp 16   Ht 4\' 11"  (1.499 m)   Wt 110 lb (49.9 kg)   SpO2 100%   BMI 22.22 kg/m   Assessment and Plan: 1. Fever blister On chin - acyclovir ointment (ZOVIRAX) 5 %; Apply 1 application topically every 3 (three) hours.  Dispense: 5 g; Refill: 0  2. Tick bite of abdomen, initial  encounter No evidence of tick fever Pt educated regarding HA, fever, rash and flu like sx   Meds ordered this encounter  Medications  . acyclovir ointment (ZOVIRAX) 5 %    Sig: Apply 1 application topically every 3 (three) hours.    Dispense:  5 g    Refill:  0    Partially dictated using . Any errors are unintentional.  Animal nutritionist, MD [facility]  12/30/2017

## 2018-01-08 ENCOUNTER — Ambulatory Visit
Admit: 2018-01-08 | Discharge: 2018-01-09 | Payer: MEDICARE | Attending: Obstetrics & Gynecology | Primary: Obstetrics & Gynecology

## 2018-01-08 DIAGNOSIS — R399 Unspecified symptoms and signs involving the genitourinary system: Principal | ICD-10-CM

## 2018-01-08 DIAGNOSIS — Z6820 Body mass index (BMI) 20.0-20.9, adult: Secondary | ICD-10-CM | POA: Diagnosis not present

## 2018-01-11 MED ORDER — FOSFOMYCIN TROMETHAMINE 3 GRAM ORAL PACKET
Freq: Once | ORAL | 1 refills | 0 days | Status: CP
Start: 2018-01-11 — End: 2018-01-11

## 2018-01-11 NOTE — Progress Notes (Signed)
Patient ID: Katherine Baird, female    DOB: 1951-06-30, 67 y.o.   MRN: [id number]  HPI  Katherine Baird is a 67 y/o female with a history of osteoporosis (multiple spontaneous fractures), HTN and chronic heart failure.   Echo report from 10/30/16 reviewed and showed an EF of 50% along with mild MR/TR.  Was at Urgent Care 08/27/17 due to right ankle pain where she was treated and released.   She presents today for a follow-up visit with a chief complaint of moderate fatigue upon minimal exertion. She describes this as chronic having been present for several years. She has associated light-headedness, pedal edema, depression and difficulty sleeping along with this. She denies any abdominal distention, palpitations, chest pain, shortness of breath or weight gain.   Past Medical History:  Diagnosis Date  . CHF (congestive heart failure) (HCC)   . Hypertension   . Osteoporosis    Past Surgical History:  Procedure Laterality Date  . ABDOMINAL HYSTERECTOMY    . LAPAROSCOPIC HYSTERECTOMY  2001   total   Family History  Problem Relation Age of Onset  . Arthritis Mother   . Dementia Mother   . Heart disease Father   . Mesothelioma Father   . Kidney cancer Sister   . Heart disease Sister   . COPD Brother   . Arthritis Brother    Social History   Tobacco Use  . Smoking status: Never Smoker  . Smokeless tobacco: Never Used  Substance Use Topics  . Alcohol use: No    Alcohol/week: 0.0 oz   Allergies  Allergen Reactions  . Levofloxacin Nausea And Vomiting  . Doxycycline   . Erythromycin   . Gabapentin   . Guaifenesin     "Felt sick" and worsening of symptoms  . Nitrofuran Derivatives   . Prednisone     muscle weakness/pain  . Sulfa Antibiotics   . Tetracycline   . Azithromycin Rash    Severe rash with peeling  . Carvedilol Other (See Comments)    Dose problem with higher doses   Prior to Admission medications   Medication Sig Start Date End Date Taking? Authorizing Provider   carvedilol (COREG) 6.25 MG tablet Take 1 tablet by mouth 2 (two) times daily.   Yes [provider]  levothyroxine (SYNTHROID, LEVOTHROID) 25 MCG tablet Take 1 tablet (25 mcg total) daily by mouth. 07/04/17  Yes Reubin Milan, MD    Review of Systems  Constitutional: Positive for fatigue. Negative for appetite change.  HENT: Negative for congestion, rhinorrhea and sore throat.   Eyes: Negative.   Respiratory: Negative for chest tightness and shortness of breath.   Cardiovascular: Positive for leg swelling (little bit). Negative for chest pain and palpitations.  Gastrointestinal: Negative for abdominal distention and abdominal pain.  Endocrine: Negative.   Genitourinary: Negative.   Musculoskeletal: Positive for arthralgias (right lower leg). Negative for back pain.  Skin: Negative.   Allergic/Immunologic: Negative.   Neurological: Positive for light-headedness. Negative for dizziness.  Hematological: Negative for adenopathy. Does not bruise/bleed easily.  Psychiatric/Behavioral: Positive for dysphoric mood (at times) and sleep disturbance (due to staying with mom with alzheimer's). The patient is not nervous/anxious.    Vitals:   01/13/18 1014  BP: (!) 148/94  Pulse: 80  Resp: 18  SpO2: 100%  Weight: 108 lb 6 oz (49.2 kg)  Height: 4\' 11"  (1.499 m)   Wt Readings from Last 3 Encounters:  01/13/18 108 lb 6 oz (49.2 kg)  12/30/17 110  lb (49.9 kg)  12/18/17 108 lb (49 kg)   Lab Results  Component Value Date   CREATININE 0.89 12/18/2017   CREATININE 0.66 07/29/2016   CREATININE 0.7 10/31/2014    Physical Exam  Constitutional: She is oriented to person, place, and time. She appears well-developed and well-nourished.  HENT:  Head: Normocephalic and atraumatic.  Neck: Normal range of motion. Neck supple. No JVD present.  Cardiovascular: Regular rhythm and normal heart sounds.  Pulmonary/Chest: Effort normal. She has no wheezes. She has no rales.  Abdominal: Soft.  She exhibits no distension. There is no tenderness.  Musculoskeletal: She exhibits edema (trace pitting edema in right lower leg). She exhibits no tenderness.  Neurological: She is alert and oriented to person, place, and time.  Skin: Skin is warm and dry.  Psychiatric: She has a normal mood and affect. Her behavior is normal. Thought content normal.  Nursing note and vitals reviewed.   Assessment & Plan:  1: Chronic heart failure with preserved ejection fraction- - NYHA class III - euvolemic today - reminded to weigh daily and to call for an overnight weight gain of >2 pounds or a weekly weight gain of >5 pounds - not adding salt nor does she cook with salt. Reviewed the importance of closely following a 2000mg  sodium diet  - encouraged her to get rest when she can (taking care of mom who has alzheimer's) - will get updated echocardiogram scheduled - saw cardiology ) 10/16/17 - PharmD reconciled medications  2: HTN- - BP initially elevated but improved after checking it with a manual cuff - saw PCP 10/18/17) 12/30/17 - BMP from 12/18/17 reviewed and showed sodium 143, potassium 4.3 and GFR 68  3: Fatigue- - patient says that her thyroid level hasn't been checked in "awhile" - she says that she will call provider to make an appointment regarding this - is on carvedilol but she says that she doesn't think the fatigue is related to medication  Patient did not bring her medications nor a list. Each medication was verbally reviewed with the patient and she was encouraged to bring the bottles to every visit to confirm accuracy of list.  Return in 3 months or sooner for any questions/problems before then.

## 2018-01-13 ENCOUNTER — Ambulatory Visit: Payer: Medicare Other | Attending: Family | Admitting: Family

## 2018-01-13 ENCOUNTER — Encounter: Payer: Self-pay | Admitting: Family

## 2018-01-13 VITALS — BP 148/94 | HR 80 | Resp 18 | Ht 59.0 in | Wt 108.4 lb

## 2018-01-13 DIAGNOSIS — Z882 Allergy status to sulfonamides status: Secondary | ICD-10-CM | POA: Insufficient documentation

## 2018-01-13 DIAGNOSIS — Z881 Allergy status to other antibiotic agents status: Secondary | ICD-10-CM | POA: Insufficient documentation

## 2018-01-13 DIAGNOSIS — R5383 Other fatigue: Secondary | ICD-10-CM | POA: Insufficient documentation

## 2018-01-13 DIAGNOSIS — M810 Age-related osteoporosis without current pathological fracture: Secondary | ICD-10-CM | POA: Insufficient documentation

## 2018-01-13 DIAGNOSIS — I110 Hypertensive heart disease with heart failure: Secondary | ICD-10-CM | POA: Insufficient documentation

## 2018-01-13 DIAGNOSIS — Z79899 Other long term (current) drug therapy: Secondary | ICD-10-CM | POA: Diagnosis not present

## 2018-01-13 DIAGNOSIS — Z888 Allergy status to other drugs, medicaments and biological substances status: Secondary | ICD-10-CM | POA: Diagnosis not present

## 2018-01-13 DIAGNOSIS — I10 Essential (primary) hypertension: Secondary | ICD-10-CM

## 2018-01-13 DIAGNOSIS — I5032 Chronic diastolic (congestive) heart failure: Secondary | ICD-10-CM | POA: Diagnosis not present

## 2018-01-13 DIAGNOSIS — R5382 Chronic fatigue, unspecified: Secondary | ICD-10-CM

## 2018-01-13 NOTE — Patient Instructions (Signed)
Continue weighing daily and call for an overnight weight gain of > 2 pounds or a weekly weight gain of >5 pounds. 

## 2018-01-14 ENCOUNTER — Encounter: Payer: Self-pay | Admitting: Physical Therapy

## 2018-01-14 ENCOUNTER — Ambulatory Visit: Payer: Medicare Other | Attending: Rheumatology | Admitting: Physical Therapy

## 2018-01-14 DIAGNOSIS — R293 Abnormal posture: Secondary | ICD-10-CM | POA: Diagnosis not present

## 2018-01-14 DIAGNOSIS — M25551 Pain in right hip: Secondary | ICD-10-CM | POA: Insufficient documentation

## 2018-01-14 DIAGNOSIS — M545 Low back pain: Secondary | ICD-10-CM | POA: Diagnosis not present

## 2018-01-14 DIAGNOSIS — M2560 Stiffness of unspecified joint, not elsewhere classified: Secondary | ICD-10-CM | POA: Insufficient documentation

## 2018-01-14 DIAGNOSIS — G8929 Other chronic pain: Secondary | ICD-10-CM | POA: Insufficient documentation

## 2018-01-14 DIAGNOSIS — M6281 Muscle weakness (generalized): Secondary | ICD-10-CM | POA: Diagnosis not present

## 2018-01-14 NOTE — Therapy (Addendum)
[facility] [address] [place], [place], [address] Phone: [phone]   Fax:  [phone]  Physical Therapy Evaluation  Patient Details  Name: Katherine Baird MRN: [id number] Date of Birth: 07-26-1951 Referring Provider: Dr. Reinaldo Berber   Encounter Date: 01/14/2018  PT End of Session - 01/14/18 1712    Visit Number  1    Number of Visits  8    Date for PT Re-Evaluation  02/11/18    PT Start Time  1520    PT Stop Time  1629    PT Time Calculation (min)  69 min    Activity Tolerance  Patient tolerated treatment well;Patient limited by pain    Behavior During Therapy  [facility] for tasks assessed/performed       Past Medical History:  Diagnosis Date  . CHF (congestive heart failure) (HCC)   . Hypertension   . Osteoporosis     Past Surgical History:  Procedure Laterality Date  . ABDOMINAL HYSTERECTOMY    . LAPAROSCOPIC HYSTERECTOMY  2001   total    There were no vitals filed for this visit.   Subjective Assessment - 01/14/18 1654    Subjective  Pleasant 67 yo female presents with c/c of chronic LBP    Pertinent History  Pt reports insidious onset LBP with flexion. Denies radicular symptoms; has experienced sciatica in the past. Pain abates quickly with moving out of position. No pain with other activities. Pt reports hx of multiple fractures secondary to low BMD; has tried many medications to manage osteoporosis with limited success. One fall resulted in shld fx (Dec 16). 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 PMH also significant for arthritis in hands. Pt is currently taking care of her mother who has Alzhiemer's. She performs yardwork and walks regularly.     How long can you sit comfortably?  n/a    How long can you stand comfortably?  n/a    How long can you walk comfortably?  n/a    Diagnostic tests  MRI demonstrates mild disc  degeneration L2-L3, L5-S1 with right lumbar/left thoracic sciolosis.    Patient Stated Goals  Reduce     Currently in Pain?  Yes    Pain Score  7     Pain Location  Back    Pain Orientation  Right;Lower    Pain Descriptors / Indicators  Aching    Pain Type  Chronic pain    Pain Onset  More than a month ago    Pain Frequency  Intermittent    Aggravating Factors   Flexion    Pain Relieving Factors  Moving out of aggravating position    Effect of Pain on Daily Activities  Pain with feeding cats, retriving objects from low cabinets    Multiple Pain Sites  No         EVALUATION  PAIN 0/10 current 7/10 worst 0/10 best  Chronic, low irritability    POSTURE Pt demonstrates moderate R lumbar/L thoracic scoliosis with rounded shoulders/forward head. L innominate elevated slightly relative to R. No LLD.  GAIT / MOBILITY  Patient independent with gait and transfers.  Gait asymmetrical due to L pelvis elevation with increased stance time on R.  PROM / AROM / MMT  Lumbar Flex   10/painful Ext   5/painful R Side-bend  5/painful L Side-bend  WNL R Rot   Limited 25%/painful L Rot  Limited 25%/painful  Hip ROM:   L  R Flexion  WNL  WNL Ext  WNL  WNL Abd  WNL  WNL Add  WNL  WNL IR  Limited  Limited ER  Limited  Limited  Hip MMT:  L R Flex 4- 4- Ext 4- 4- Abd 4- 4- Add 4+ 4+  Knee & ankle AROM and MMT WNL except knee flexion 4/5   SOFT TISSUE LENGTH Supine HS limited to 78 deg L 50 deg R Quads limited to 108 L 120 R  PALPATION / PAM CPAs deferred due to PMH of low BMD, fx  NEURO Pt reports occasional tingling into B hands down arms + ULTT 2A and 3  SPECIAL TESTS - Supine sign - Prone knee bend B - Slump B + Extension-rotation B FABER - unable to achieve position (limited ER) SLR - unable to achieve position on R (limited HS)   OUTCOME MEASURES  Pt did not complete FOTO.  TREATMENT  Pt reports pain with repeated motions, not easing.  Reports  mild easing with supine lumbar rotation; added to HEP.  Pt instructed in body mechanics for squat to avoid lumbar flexion; added to HEP.  Pt instructed in TvA activation series; added to HEP.  Pt instructed in neurodynamic flossing therex for median n; added to HEP.   Objective measurements completed on examination: See above findings.       PT Education - 01/14/18 1711    Education provided  Yes    Education Details  HEP; POC    Person(s) Educated  Patient    Methods  Explanation;Demonstration;Tactile cues;Verbal cues;Handout    Comprehension  Verbalized understanding;Returned demonstration;Verbal cues required;Tactile cues required          PT Long Term Goals - 01/14/18 1733      PT LONG TERM GOAL #1   Title  Pt will reduce pain with lumbar flexion to 3/10 to demonstrate improved function with ADLs.    Baseline  7/10    Time  4    Period  Weeks    Status  New    Target Date  02/11/18      PT LONG TERM GOAL #2   Title  Pt will demonstrate increase in R HS ROM to 70 deg to reduce strain on low back with flexion.    Baseline  R 40 deg L 78 deg    Time  4    Period  Weeks    Status  New    Target Date  02/18/18      PT LONG TERM GOAL #3   Title  Pt will improve gross LE strength to 4/5 over BLE to reduce risk of falling and improve ADLs and ability to care for mother.    Baseline  4-/5    Time  4    Period  Weeks    Status  New    Target Date  02/18/18      PT LONG TERM GOAL #4   Title  Pt will demonstrate ability to perform squat with minimal UE assist without pain to feed cats and retrieve objects from low cabinets.    Baseline  Requires max UE assist    Time  4    Period  Weeks    Status  New    Target Date  02/18/18         Plan - 01/14/18 1714    Clinical Impression Statement  67 yo female reports to PT with  insidious onset chronic LBP. Patient has a history of osteoporosis and multiple fx, apparently spontaneous, including shoulder fx following  fall in Dec 2016. Pt demonstrates the following postural abnormalities: moderate scolosis right lumbar left thoracic, rounded shoulders/forward head, mild flexed forward posture. Patient steady with ambulation but reports fear of activity due to fractures. Lumbar ROM limited in all planes except L side bending. Hip ROM limited in external and internal rotation; knee ROM limited by tight HS and quads. Hip strength grossly 4-/5; knees and ankles 5/5. Special tests negative for radicular sx. Pelvis elevated on L. CPAs deferred due to low BMD; tissue tension noted in R abdominals, QL. Pain does not respond to repeated movements but is improved slightly with lumbar rotation exercises. Patient capable of performing low squat with UE assist pain-free to substitute for lumbar flexion. Impression: LBP essentially due to postural impairments secondary to scoliosis. Patient will benefit from skilled physical therapy to reduce pain with ADLs and reduce fall risk.    History and Personal Factors relevant to plan of care:  hx of falls, fx; low BMD; scoliosis; age    Clinical Presentation  Evolving    Clinical Presentation due to:  Recent spontaneous stress fx; low BMD; high fear of falling    Clinical Decision Making  Moderate    Rehab Potential  Fair    Clinical Impairments Affecting Rehab Potential  + low irritability / - comorbidities, chronicity of condition, age    PT Frequency  2x / week    PT Duration  4 weeks    PT Treatment/Interventions  Aquatic Therapy;Electrical Stimulation;Cryotherapy;Moist Heat;Ultrasound;DME Instruction;Gait training;Stair training;Functional mobility training;Therapeutic activities;Therapeutic exercise;Balance training;Neuromuscular re-education;Patient/family education;Orthotic Fit/Training;Manual techniques;Passive range of motion;Dry needling;Energy conservation;Splinting;Taping    PT Next Visit Plan  Hip exam; advance core strengthening; LE strengthening    PT Home Exercise Plan   Supine lumbar rotation; UE neurodynamic stretch; TVA series 1-2; mini squats with UE assist    Consulted and Agree with Plan of Care  Patient       Patient will benefit from skilled therapeutic intervention in order to improve the following deficits and impairments:  Pain, Decreased mobility, Decreased range of motion, Decreased strength, Impaired perceived functional ability, Impaired flexibility, Postural dysfunction, Improper body mechanics, Abnormal gait, Difficulty walking  Visit Diagnosis: Muscle weakness (generalized)  Chronic right-sided low back pain without sciatica  Abnormal posture     Problem List Patient Active Problem List   Diagnosis Date Noted  . Fatigue 01/13/2018  . Chronic diastolic heart failure (HCC) 12/17/2017  . 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 Syncope 02/24/2017  . Hypothyroidism 05/24/2016  . HLD (hyperlipidemia) 01/27/2015  . Paroxysmal digital cyanosis 01/27/2015  . 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 Essential (primary) hypertension 01/11/2015  . Rheumatoid arthritis involving multiple joints (HCC) 01/11/2015  . 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 SPT 01/15/2018, 9:42 AM  [facility] [address] [place], [place], [address] Phone: [phone]   Fax:  [phone]  Name: Katherine Baird MRN: [id number] Date of Birth: 1951/07/21

## 2018-01-16 MED ORDER — CEFDINIR 300 MG CAPSULE
ORAL_CAPSULE | Freq: Two times a day (BID) | ORAL | 0 refills | 0.00000 days | Status: CP
Start: 2018-01-16 — End: 2018-01-21

## 2018-01-21 ENCOUNTER — Ambulatory Visit: Payer: Medicare Other

## 2018-01-22 ENCOUNTER — Ambulatory Visit
Admission: RE | Admit: 2018-01-22 | Discharge: 2018-01-22 | Disposition: A | Discharge status: Home / Self Care | Payer: Medicare Other | Source: Ambulatory Visit | Attending: Family | Admitting: Family

## 2018-01-22 DIAGNOSIS — I110 Hypertensive heart disease with heart failure: Secondary | ICD-10-CM | POA: Diagnosis not present

## 2018-01-22 DIAGNOSIS — I5032 Chronic diastolic (congestive) heart failure: Secondary | ICD-10-CM | POA: Insufficient documentation

## 2018-01-22 NOTE — Progress Notes (Signed)
*  PRELIMINARY RESULTS* Echocardiogram 2D Echocardiogram has been performed.  Cristela Blue 01/22/2018, 10:54 AM

## 2018-01-23 ENCOUNTER — Ambulatory Visit: Payer: Medicare Other | Admitting: Physical Therapy

## 2018-01-23 DIAGNOSIS — M6281 Muscle weakness (generalized): Secondary | ICD-10-CM | POA: Diagnosis not present

## 2018-01-23 DIAGNOSIS — R293 Abnormal posture: Secondary | ICD-10-CM | POA: Diagnosis not present

## 2018-01-23 DIAGNOSIS — M545 Low back pain: Principal | ICD-10-CM

## 2018-01-23 DIAGNOSIS — M2560 Stiffness of unspecified joint, not elsewhere classified: Secondary | ICD-10-CM

## 2018-01-23 DIAGNOSIS — M25551 Pain in right hip: Secondary | ICD-10-CM

## 2018-01-23 DIAGNOSIS — G8929 Other chronic pain: Secondary | ICD-10-CM

## 2018-01-23 NOTE — Therapy (Addendum)
Katherine Baird, Katherine Baird, Katherine Baird/2019   Treatment: 2 of 8.  Recert date: 02/11/18 1023 to 1125   Past Medical History:  Diagnosis Date  . CHF (congestive heart failure) (HCC)   . Hypertension   . Osteoporosis     Past Surgical History:  Procedure Laterality Date  . ABDOMINAL HYSTERECTOMY    . LAPAROSCOPIC HYSTERECTOMY  2001   total    There were no vitals filed for this visit.       Pt. reports LBP is a lot better since last tx. session. Pt. did not subjectify pain score.         There.ex.:  Nustep L4 for 7 min. B UE/LE (slight increase in R SI/LBP) Standing shoulder flexion/ abduction 20x (AROM)- mirror feedback Supine B shoulder alt. UE 20x (core muscle activation) Resisted bicep curls/ tricep ext./ scap. Retraction GTB 20x each at //-bars Supine/ sidelying core ex. Program (11 min.)  Issue resisted ex. Program next tx. Session  Manual tx.:  Supine LE/lumbar stretches (12 min.)          Slight increase in R SI/LBP during use of Nustep. R shoulder stiffness noted dur to old fracture. Pt. able to complete UE resisted ex. program with no increase c/o back pain. PT will issue resisetd ex. program next tx. session.      PT Long Term Goals - 01/14/18 1733      PT LONG TERM GOAL #1   Title  Pt will reduce pain with lumbar flexion to 3/10 to demonstrate improved function with ADLs.    Baseline  7/10    Time  4    Period  Weeks    Status  New    Target Date  02/11/18      PT LONG TERM GOAL #2   Title  Pt will demonstrate increase in R HS ROM to 70 deg to reduce strain on low back with flexion.    Baseline  R 40 deg L 78 deg    Time  4    Period  Weeks    Status   New    Target Date  02/18/18      PT LONG TERM GOAL #3   Title  Pt will improve gross LE strength to 4/5 over BLE to reduce risk of falling and improve ADLs and ability to care for mother.    Baseline  4-/5    Time  4    Period  Weeks    Status  New    Target Date  02/18/18      PT LONG TERM GOAL #4   Title  Pt will demonstrate ability to perform squat with minimal UE assist without pain to feed cats and retrieve objects from low cabinets.    Baseline  Requires max UE assist    Time  4    Period  Weeks    Status  New    Target Date  02/18/18          Patient will benefit from skilled therapeutic intervention in order to improve the following deficits and impairments:     Visit Diagnosis: Chronic right-sided low back pain without sciatica  Abnormal posture  Muscle weakness (generalized)  Pain in right  hip  Joint stiffness     Problem List Patient Active Problem List   Diagnosis Date Noted  . Fatigue 01/13/2018  . Chronic diastolic heart failure (HCC) 12/17/2017  . 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 Syncope 02/24/2017  . Hypothyroidism 05/24/2016  . HLD (hyperlipidemia) 01/27/2015  . Paroxysmal digital cyanosis 01/27/2015  . 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 Essential (primary) hypertension 01/11/2015  . Rheumatoid arthritis involving multiple joints (HCC) 01/11/2015  . 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 [place], [place], [address] Phone: [phone]   Fax:  [phone]  Name: Rox Mcgriff MRN: [id number] Date of Birth: [birth year]

## 2018-01-27 DIAGNOSIS — E039 Hypothyroidism, unspecified: Secondary | ICD-10-CM | POA: Diagnosis not present

## 2018-01-27 LAB — CBC AND DIFFERENTIAL
HEMATOCRIT: 29 — AB (ref 36–46)
Hemoglobin: 9.3 — AB (ref 12.0–16.0)
Platelets: 343 (ref 150–399)
WBC: 6.6

## 2018-01-27 LAB — VITAMIN D 25 HYDROXY (VIT D DEFICIENCY, FRACTURES): Vit D, 25-Hydroxy: 42.5

## 2018-01-27 LAB — VITAMIN B12: VITAMIN B 12: 276

## 2018-01-27 LAB — TSH: TSH: 5.68 (ref 0.41–5.90)

## 2018-01-28 ENCOUNTER — Telehealth: Payer: Self-pay | Admitting: Internal Medicine

## 2018-01-28 ENCOUNTER — Encounter: Payer: Self-pay | Admitting: Internal Medicine

## 2018-01-28 DIAGNOSIS — D508 Other iron deficiency anemias: Secondary | ICD-10-CM

## 2018-01-28 NOTE — Telephone Encounter (Signed)
Pt can request the MD that she prefers.

## 2018-01-28 NOTE — Telephone Encounter (Signed)
Please send referral to  Gerarda Fraction Oncologist if appropriate since she is aware of his skills through other patients and states she likes what he does. If not him then the next best available. Please contact patient when referral made.

## 2018-01-28 NOTE — Telephone Encounter (Signed)
Blood count is lower.  Need to see Hematology for further evaluation.

## 2018-01-29 ENCOUNTER — Encounter: Payer: Medicare Other | Admitting: Physical Therapy

## 2018-01-29 ENCOUNTER — Encounter
Admit: 2018-01-29 | Discharge: 2018-01-29 | Payer: MEDICARE | Attending: Obstetrics & Gynecology | Primary: Obstetrics & Gynecology

## 2018-01-29 ENCOUNTER — Encounter: Admit: 2018-01-29 | Discharge: 2018-01-29 | Payer: MEDICARE

## 2018-01-29 DIAGNOSIS — N898 Other specified noninflammatory disorders of vagina: Principal | ICD-10-CM

## 2018-01-29 DIAGNOSIS — N819 Female genital prolapse, unspecified: Secondary | ICD-10-CM

## 2018-01-29 DIAGNOSIS — L2084 Intrinsic (allergic) eczema: Principal | ICD-10-CM

## 2018-01-29 DIAGNOSIS — Z6821 Body mass index (BMI) 21.0-21.9, adult: Secondary | ICD-10-CM | POA: Diagnosis not present

## 2018-01-29 MED ORDER — CLOBETASOL 0.05 % TOPICAL OINTMENT
5 refills | 0 days | Status: CP
Start: 2018-01-29 — End: 2018-05-13

## 2018-02-05 ENCOUNTER — Encounter: Payer: Medicare Other | Admitting: Physical Therapy

## 2018-02-08 DIAGNOSIS — D509 Iron deficiency anemia, unspecified: Secondary | ICD-10-CM | POA: Insufficient documentation

## 2018-02-08 NOTE — Progress Notes (Addendum)
[facility]  [phone] [phone]  ID: Clarisa Fling OB: 02-Jul-1951  MR#: [id number]  DTO#:[id number]  Patient Care Team: Reubin Milan, MD as PCP - General (Family Medicine) Lamar Blinks, MD as Consulting Physician (Cardiology) Claiborne Billings, MD as Referring Physician (Obstetrics and Gynecology) Fritzi Mandes, MD as Referring Physician (Dermatology) Deeann Saint, MD (Orthopedic Surgery)  CHIEF COMPLAINT: Iron deficiency anemia.  INTERVAL HISTORY: Patient is a 67 year old female with a long-standing history of iron deficiency anemia.  She is referred back for declining hemoglobin and iron stores.  She has chronic weakness and fatigue, but otherwise feels well.  She has no neurologic complaints.  She denies any recent fevers or illnesses.  She has a good appetite and denies weight loss.  She has no chest pain or shortness of breath.  She denies any nausea, vomiting, constipation, or diarrhea.  She has no melena or hematochezia.  She has no urinary complaints.  Patient otherwise feels well and offers no further specific complaints today.  REVIEW OF SYSTEMS:   Review of Systems  Constitutional: Positive for malaise/fatigue. Negative for fever and weight loss.  Respiratory: Negative.  Negative for cough and shortness of breath.   Cardiovascular: Negative.  Negative for chest pain and leg swelling.  Gastrointestinal: Negative.  Negative for abdominal pain, blood in stool and melena.  Genitourinary: Negative.  Negative for hematuria.  Musculoskeletal: Negative.  Negative for back pain.  Skin: Negative.  Negative for rash.  Neurological: Negative.  Negative for sensory change, focal weakness and weakness.  Psychiatric/Behavioral: Negative.  The patient is not nervous/anxious.     As per HPI. Otherwise, a complete review of systems is negative.  PAST MEDICAL HISTORY: Past Medical History:  Diagnosis Date  . CHF (congestive  heart failure) (HCC)   . Hypertension   . Osteoporosis   . Thyroid disease     PAST SURGICAL HISTORY: Past Surgical History:  Procedure Laterality Date  . ABDOMINAL HYSTERECTOMY    . LAPAROSCOPIC HYSTERECTOMY  2001   total    FAMILY HISTORY: Family History  Problem Relation Age of Onset  . Arthritis Mother   . Dementia Mother   . Heart disease Father   . Mesothelioma Father   . Kidney cancer Sister   . Heart disease Sister   . Arthritis Sister   . COPD Brother   . Arthritis Brother   . Arthritis Brother   . COPD Sister     ADVANCED DIRECTIVES (Y/N):  N  HEALTH MAINTENANCE: Social History   Tobacco Use  . Smoking status: Never Smoker  . Smokeless tobacco: Never Used  Substance Use Topics  . Alcohol use: No    Alcohol/week: 0.0 oz  . Drug use: No     Colonoscopy:  PAP:  Bone density:  Lipid panel:  Allergies  Allergen Reactions  . Levofloxacin Nausea And Vomiting  . Doxycycline   . Erythromycin   . Gabapentin   . Guaifenesin     "Felt sick" and worsening of symptoms  . Nitrofuran Derivatives   . Prednisone     muscle weakness/pain  . Sulfa Antibiotics   . Tetracycline   . Azithromycin Rash    Severe rash with peeling  . Carvedilol Other (See Comments)    Dose problem with higher doses    Current Outpatient Medications  Medication Sig Dispense Refill  . carvedilol (COREG) 6.25 MG tablet Take 1 tablet by mouth 2 (two) times daily.    Marland Kitchen  levothyroxine (SYNTHROID, LEVOTHROID) 25 MCG tablet Take 1 tablet (25 mcg total) daily by mouth. (Patient taking differently: Take 25 mcg by mouth daily. ) 90 tablet 3  . clobetasol ointment (TEMOVATE) 0.05 % Apply 1 application topically 2 (two) times daily.      No current facility-administered medications for this visit.     OBJECTIVE: Vitals:   02/10/18 1204  BP: (!) 166/83  Pulse: 81  Resp: 18  Temp: (!) 96.3 F (35.7 C)     Body mass index is 20.13 kg/m.    ECOG FS:0 - Asymptomatic  General:  Well-developed, well-nourished, no acute distress. Eyes: Pink conjunctiva, anicteric sclera. HEENT: Normocephalic, moist mucous membranes, clear oropharnyx. Lungs: Clear to auscultation bilaterally. Heart: Regular rate and rhythm. No rubs, murmurs, or gallops. Abdomen: Soft, nontender, nondistended. No organomegaly noted, normoactive bowel sounds. Musculoskeletal: No edema, cyanosis, or clubbing. Neuro: Alert, answering all questions appropriately. Cranial nerves grossly intact. Skin: No rashes or petechiae noted. Psych: Normal affect. Lymphatics: No cervical, calvicular, axillary or inguinal LAD.   LAB RESULTS:  Lab Results  Component Value Date   NA 143 12/18/2017   K 4.3 12/18/2017   CL 106 12/18/2017   CO2 25 12/18/2017   GLUCOSE 98 12/18/2017   BUN 11 12/18/2017   CREATININE 0.89 12/18/2017   CALCIUM 9.5 12/18/2017   PROT 7.6 12/18/2017   ALBUMIN 4.6 12/18/2017   AST 18 12/18/2017   ALT 6 12/18/2017   ALKPHOS 106 12/18/2017   BILITOT 0.4 12/18/2017   GFRNONAA 68 12/18/2017   GFRAA 78 12/18/2017    Lab Results  Component Value Date   WBC 6.6 01/27/2018   NEUTROABS 3.1 12/18/2017   HGB 9.3 (A) 01/27/2018   HCT 29 (A) 01/27/2018   MCV 75 (L) 12/18/2017   PLT 343 01/27/2018   Lab Results  Component Value Date   IRON 32 12/18/2017   TIBC 354 12/18/2017   IRONPCTSAT 9 (LL) 12/18/2017   Lab Results  Component Value Date   FERRITIN 10 (L) 12/18/2017     STUDIES: No results found.  ASSESSMENT: Iron deficiency anemia  PLAN:    1. Iron deficiency anemia: Patient's most recent hemoglobin and iron stores are significantly decreased.  By report, patient cannot tolerate oral iron supplementation.  Will consider full anemia work-up in the future if hemoglobin does not improve with iron infusion.  Proceed with 510 mg IV Feraheme later this week.  Patient will then return to clinic in 1 week for second infusion.  She will then return to clinic in 2 months with repeat  laboratory work and further evaluation.  I spent a total of 45 minutes face-to-face with the patient of which greater than 50% of the visit was spent in counseling and coordination of care as summarized above.  Patient expressed understanding and was in agreement with this plan. She also understands that She can call clinic at any time with any questions, concerns, or complaints.    Jeralyn Ruths, MD   02/15/2018 8:02 AM   Addendum: Case discussed with patient's dermatologist Dr. Archie Balboa.  Patient had extensive skin eruption several days after her treatment, much worse than her typical dermatologic symptoms.  Unclear if this is related to the iron infusion, but agreed to premedicate patient with Benadryl and Tylenol if she requires Feraheme again.   Jeralyn Ruths, MD 03/20/18 3:23 PM

## 2018-02-10 ENCOUNTER — Other Ambulatory Visit: Payer: Self-pay

## 2018-02-10 ENCOUNTER — Inpatient Hospital Stay: Payer: Medicare Other | Attending: Oncology | Admitting: Oncology

## 2018-02-10 ENCOUNTER — Encounter: Payer: Self-pay | Admitting: Oncology

## 2018-02-10 DIAGNOSIS — R5381 Other malaise: Secondary | ICD-10-CM | POA: Insufficient documentation

## 2018-02-10 DIAGNOSIS — D509 Iron deficiency anemia, unspecified: Secondary | ICD-10-CM | POA: Diagnosis not present

## 2018-02-10 DIAGNOSIS — Z79899 Other long term (current) drug therapy: Secondary | ICD-10-CM | POA: Insufficient documentation

## 2018-02-10 DIAGNOSIS — E079 Disorder of thyroid, unspecified: Secondary | ICD-10-CM | POA: Insufficient documentation

## 2018-02-10 DIAGNOSIS — I110 Hypertensive heart disease with heart failure: Secondary | ICD-10-CM | POA: Insufficient documentation

## 2018-02-10 DIAGNOSIS — R531 Weakness: Secondary | ICD-10-CM

## 2018-02-10 DIAGNOSIS — M810 Age-related osteoporosis without current pathological fracture: Secondary | ICD-10-CM | POA: Diagnosis not present

## 2018-02-10 NOTE — Progress Notes (Signed)
Here for new pt evaluation.  

## 2018-02-12 ENCOUNTER — Encounter: Payer: Medicare Other | Admitting: Physical Therapy

## 2018-02-12 ENCOUNTER — Other Ambulatory Visit: Payer: Self-pay | Admitting: Oncology

## 2018-02-13 ENCOUNTER — Inpatient Hospital Stay: Payer: Medicare Other

## 2018-02-13 VITALS — BP 120/68 | HR 69 | Temp 97.6°F | Resp 18 | Wt 109.4 lb

## 2018-02-13 DIAGNOSIS — R531 Weakness: Secondary | ICD-10-CM | POA: Diagnosis not present

## 2018-02-13 DIAGNOSIS — E079 Disorder of thyroid, unspecified: Secondary | ICD-10-CM | POA: Diagnosis not present

## 2018-02-13 DIAGNOSIS — I110 Hypertensive heart disease with heart failure: Secondary | ICD-10-CM | POA: Diagnosis not present

## 2018-02-13 DIAGNOSIS — Z79899 Other long term (current) drug therapy: Secondary | ICD-10-CM | POA: Diagnosis not present

## 2018-02-13 DIAGNOSIS — M810 Age-related osteoporosis without current pathological fracture: Secondary | ICD-10-CM | POA: Diagnosis not present

## 2018-02-13 DIAGNOSIS — D509 Iron deficiency anemia, unspecified: Secondary | ICD-10-CM

## 2018-02-13 DIAGNOSIS — R5381 Other malaise: Secondary | ICD-10-CM | POA: Diagnosis not present

## 2018-02-13 MED ORDER — SODIUM CHLORIDE 0.9 % IV SOLN
510.0000 mg | Freq: Once | INTRAVENOUS | Status: AC
  Administered 2018-02-13: 510 mg via INTRAVENOUS
  Filled 2018-02-13: qty 17

## 2018-02-13 MED ORDER — SODIUM CHLORIDE 0.9 % IV SOLN
Freq: Once | INTRAVENOUS | Status: AC
  Administered 2018-02-13: 10:00:00 via INTRAVENOUS
  Filled 2018-02-13: qty 1000

## 2018-02-19 ENCOUNTER — Inpatient Hospital Stay: Payer: Medicare Other

## 2018-02-19 VITALS — BP 156/71 | HR 79 | Temp 97.1°F | Resp 18

## 2018-02-19 DIAGNOSIS — I110 Hypertensive heart disease with heart failure: Secondary | ICD-10-CM | POA: Diagnosis not present

## 2018-02-19 DIAGNOSIS — Z79899 Other long term (current) drug therapy: Secondary | ICD-10-CM | POA: Diagnosis not present

## 2018-02-19 DIAGNOSIS — D509 Iron deficiency anemia, unspecified: Secondary | ICD-10-CM | POA: Diagnosis not present

## 2018-02-19 DIAGNOSIS — R531 Weakness: Secondary | ICD-10-CM | POA: Diagnosis not present

## 2018-02-19 DIAGNOSIS — R5381 Other malaise: Secondary | ICD-10-CM | POA: Diagnosis not present

## 2018-02-19 DIAGNOSIS — M810 Age-related osteoporosis without current pathological fracture: Secondary | ICD-10-CM | POA: Diagnosis not present

## 2018-02-19 DIAGNOSIS — E079 Disorder of thyroid, unspecified: Secondary | ICD-10-CM | POA: Diagnosis not present

## 2018-02-19 MED ORDER — SODIUM CHLORIDE 0.9 % IV SOLN
510.0000 mg | Freq: Once | INTRAVENOUS | Status: AC
  Administered 2018-02-19: 510 mg via INTRAVENOUS
  Filled 2018-02-19: qty 17

## 2018-02-19 MED ORDER — SODIUM CHLORIDE 0.9 % IV SOLN
Freq: Once | INTRAVENOUS | Status: AC
  Administered 2018-02-19: 09:00:00 via INTRAVENOUS
  Filled 2018-02-19: qty 1000

## 2018-02-19 NOTE — Patient Instructions (Signed)

## 2018-03-06 ENCOUNTER — Encounter: Payer: Self-pay | Admitting: Internal Medicine

## 2018-03-06 ENCOUNTER — Ambulatory Visit (INDEPENDENT_AMBULATORY_CARE_PROVIDER_SITE_OTHER): Payer: Medicare Other | Admitting: Internal Medicine

## 2018-03-06 VITALS — BP 142/68 | HR 82 | Resp 16 | Ht <= 58 in | Wt 105.0 lb

## 2018-03-06 DIAGNOSIS — I5032 Chronic diastolic (congestive) heart failure: Secondary | ICD-10-CM | POA: Diagnosis not present

## 2018-03-06 DIAGNOSIS — M359 Systemic involvement of connective tissue, unspecified: Secondary | ICD-10-CM

## 2018-03-06 DIAGNOSIS — D509 Iron deficiency anemia, unspecified: Secondary | ICD-10-CM

## 2018-03-06 DIAGNOSIS — R110 Nausea: Secondary | ICD-10-CM | POA: Diagnosis not present

## 2018-03-06 MED ORDER — ONDANSETRON HCL 4 MG PO TABS: 4.0000 mg | ORAL_TABLET | Freq: Three times a day (TID) | ORAL | 1 refills | Status: DC | PRN

## 2018-03-06 NOTE — Progress Notes (Signed)
Date:  03/06/2018   Name:  Katherine Baird   DOB:  10/17/1950   MRN:  [id number]   Chief Complaint: Cyanosis and Fatigue She is not feeling well lately.Her friend has noticed that her forehead is purple. She is not short of breath, has no chest pain or palpitations or rapid heart rate.  She has not had fever or chills. She has nausea to point that she is not eating much at all.  She is drinking fairly well.  No vomiting, abd pain or burning/gerd like her past hx of ulcer. She is using cortisone on her skin but nothing on her face.  Her finger nails are dystrophic again.  She has noticed 'lumps' under the surface of her skin in one location. She is receiving iron infusions for severe iron deficiency anemia.  Recent labs were fairly normal except for the anemia. She has not had a FIT test.  Review of Systems  Constitutional: Positive for appetite change and fatigue. Negative for chills.  HENT: Negative for sore throat, trouble swallowing and voice change.   Respiratory: Negative for cough, chest tightness, shortness of breath and wheezing.   Gastrointestinal: Positive for nausea. Negative for blood in stool and constipation.  Skin: Positive for color change and pallor (and purple discoloration).       Nail changes  Hematological: Negative for adenopathy.  Psychiatric/Behavioral: Negative for dysphoric mood and sleep disturbance. The patient is not nervous/anxious.     Patient Active Problem List   Diagnosis Date Noted  . Iron deficiency anemia 02/08/2018  . Fatigue 01/13/2018  . Chronic diastolic heart failure (HCC) 12/17/2017  . 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 Syncope 02/24/2017  . Hypothyroidism 05/24/2016  . HLD (hyperlipidemia) 01/27/2015  . Paroxysmal digital cyanosis 01/27/2015  . 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 Essential (primary) hypertension 01/11/2015  . Rheumatoid arthritis involving multiple joints (HCC) 01/11/2015  . Pelvic relaxation due  to vaginal prolapse 05/30/2014    Prior to Admission medications   Medication Sig Start Date End Date Taking? Authorizing Provider  carvedilol (COREG) 6.25 MG tablet Take 1 tablet by mouth 2 (two) times daily.   Yes [provider]  clobetasol ointment (TEMOVATE) 0.05 % Apply 1 application topically 2 (two) times daily.  01/29/18  Yes [provider]  levothyroxine (SYNTHROID, LEVOTHROID) 50 MCG tablet  02/25/18  Yes [provider]    Allergies  Allergen Reactions  . Levofloxacin Nausea And Vomiting  . Doxycycline   . Erythromycin   . Gabapentin   . Guaifenesin     "Felt sick" and worsening of symptoms  . Nitrofuran Derivatives   . Prednisone     muscle weakness/pain  . Sulfa Antibiotics   . Tetracycline   . Azithromycin Rash    Severe rash with peeling  . Carvedilol Other (See Comments)    Dose problem with higher doses    Past Surgical History:  Procedure Laterality Date  . ABDOMINAL HYSTERECTOMY    . LAPAROSCOPIC HYSTERECTOMY  2001   total    Social History   Tobacco Use  . Smoking status: Never Smoker  . Smokeless tobacco: Never Used  Substance Use Topics  . Alcohol use: No    Alcohol/week: 0.0 oz  . Drug use: No     Medication list has been reviewed and updated.  Current Meds  Medication Sig  . carvedilol (COREG) 6.25 MG tablet Take 1 tablet by mouth 2 (two) times daily.  . clobetasol ointment (  TEMOVATE) 0.05 % Apply 1 application topically 2 (two) times daily.   Marland Kitchen levothyroxine (SYNTHROID, LEVOTHROID) 50 MCG tablet     PHQ 2/9 Scores 01/13/2018 09/19/2017 05/24/2016  PHQ - 2 Score 0 0 0    Physical Exam  Constitutional: She is oriented to person, place, and time. She appears well-developed. She appears cachectic. No distress.  HENT:  Head: Normocephalic and atraumatic.  Neck: Full passive range of motion without pain.  Cardiovascular: Normal rate, regular rhythm, normal heart sounds and normal pulses.  No murmur  heard. Pulmonary/Chest: Effort normal and breath sounds normal. No respiratory distress. She has no wheezes. She has no rhonchi.  Abdominal: Soft. Normal appearance. There is no tenderness.  Musculoskeletal: She exhibits no edema.  Neurological: She is alert and oriented to person, place, and time.  Skin: Skin is warm and dry. No rash noted. There is erythema (scattered areas on face and chin).     Psychiatric: She has a normal mood and affect. Her behavior is normal. Thought content normal.  Nursing note and vitals reviewed.   BP (!) 142/68   Pulse 82   Resp 16   Ht 4\' 10"  (1.473 m)   Wt 105 lb (47.6 kg)   SpO2 100%   BMI 21.95 kg/m   Assessment and Plan: 1. Nausea in adult Pt declines UGI/Ba swallow Will try to treat sx and have close follow up - ondansetron (ZOFRAN) 4 MG tablet; Take 1 tablet (4 mg total) by mouth every 8 (eight) hours as needed for nausea or vomiting.  Dispense: 30 tablet; Refill: 1  2. Chronic diastolic heart failure (HCC) Appears compensated  3. Connective tissue disease, undifferentiated (HCC) May be contributing to constitutional sx Recommend more aggressive treatment of skin disease with bid application of steroid cream  4. Iron deficiency anemia, unspecified iron deficiency anemia type Getting iron infusions Will to FIT to help exclude possible PUD or other GI process - Fecal occult blood, imunochemical   Meds ordered this encounter  Medications  . ondansetron (ZOFRAN) 4 MG tablet    Sig: Take 1 tablet (4 mg total) by mouth every 8 (eight) hours as needed for nausea or vomiting.    Dispense:  30 tablet    Refill:  1    Partially dictated using . Any errors are unintentional.  Animal nutritionist, MD [facility]  03/06/2018   There are no diagnoses linked to this encounter.

## 2018-03-12 ENCOUNTER — Encounter
Admit: 2018-03-12 | Discharge: 2018-03-12 | Payer: MEDICARE | Attending: Obstetrics & Gynecology | Primary: Obstetrics & Gynecology

## 2018-03-12 DIAGNOSIS — R399 Unspecified symptoms and signs involving the genitourinary system: Principal | ICD-10-CM

## 2018-03-12 DIAGNOSIS — Z4689 Encounter for fitting and adjustment of other specified devices: Secondary | ICD-10-CM

## 2018-03-12 DIAGNOSIS — N952 Postmenopausal atrophic vaginitis: Secondary | ICD-10-CM

## 2018-03-12 DIAGNOSIS — Z6820 Body mass index (BMI) 20.0-20.9, adult: Secondary | ICD-10-CM | POA: Diagnosis not present

## 2018-03-13 MED ORDER — CEFUROXIME AXETIL 250 MG TABLET
ORAL_TABLET | Freq: Two times a day (BID) | ORAL | 0 refills | 0.00000 days | Status: CP
Start: 2018-03-13 — End: 2018-03-16

## 2018-03-16 ENCOUNTER — Inpatient Hospital Stay: Payer: Medicare Other | Attending: Oncology

## 2018-03-16 ENCOUNTER — Other Ambulatory Visit: Payer: Self-pay | Admitting: *Deleted

## 2018-03-16 DIAGNOSIS — D649 Anemia, unspecified: Secondary | ICD-10-CM

## 2018-03-16 DIAGNOSIS — D509 Iron deficiency anemia, unspecified: Secondary | ICD-10-CM | POA: Insufficient documentation

## 2018-03-16 LAB — IRON AND TIBC
Iron: 42 ug/dL (ref 28–170)
SATURATION RATIOS: 23 % (ref 10.4–31.8)
TIBC: 186 ug/dL — ABNORMAL LOW (ref 250–450)
UIBC: 144 ug/dL

## 2018-03-16 LAB — CBC WITH DIFFERENTIAL/PLATELET
BASOS ABS: 0.1 10*3/uL (ref 0–0.1)
BASOS PCT: 2 %
EOS ABS: 0.3 10*3/uL (ref 0–0.7)
Eosinophils Relative: 8 %
HCT: 33.2 % — ABNORMAL LOW (ref 35.0–47.0)
Hemoglobin: 11 g/dL — ABNORMAL LOW (ref 12.0–16.0)
Lymphocytes Relative: 23 %
Lymphs Abs: 0.9 10*3/uL — ABNORMAL LOW (ref 1.0–3.6)
MCH: 26.8 pg (ref 26.0–34.0)
MCHC: 33.1 g/dL (ref 32.0–36.0)
MCV: 81.1 fL (ref 80.0–100.0)
Monocytes Absolute: 0.4 10*3/uL (ref 0.2–0.9)
Monocytes Relative: 11 %
NEUTROS ABS: 2.3 10*3/uL (ref 1.4–6.5)
NEUTROS PCT: 56 %
Platelets: 223 10*3/uL (ref 150–440)
RBC: 4.1 MIL/uL (ref 3.80–5.20)
RDW: 21.3 % — ABNORMAL HIGH (ref 11.5–14.5)
WBC: 4.1 10*3/uL (ref 3.6–11.0)

## 2018-03-16 LAB — FERRITIN: FERRITIN: 417 ng/mL — AB (ref 11–307)

## 2018-03-19 ENCOUNTER — Encounter: Admit: 2018-03-19 | Discharge: 2018-03-20 | Payer: MEDICARE

## 2018-03-19 DIAGNOSIS — R21 Rash and other nonspecific skin eruption: Principal | ICD-10-CM

## 2018-03-19 DIAGNOSIS — L2084 Intrinsic (allergic) eczema: Secondary | ICD-10-CM

## 2018-03-19 DIAGNOSIS — L2089 Other atopic dermatitis: Secondary | ICD-10-CM

## 2018-03-19 MED ORDER — DUPILUMAB 300 MG/2 ML SUBCUTANEOUS SYRINGE
INJECTION | SUBCUTANEOUS | 6 refills | 0.00000 days | Status: CP
Start: 2018-03-19 — End: 2018-03-19

## 2018-03-19 MED ORDER — CETIRIZINE 10 MG TABLET
ORAL_TABLET | 6 refills | 0 days | Status: CP
Start: 2018-03-19 — End: ?

## 2018-03-19 MED ORDER — DUPILUMAB 300 MG/2 ML SUBCUTANEOUS SYRINGE: Syringe | 6 refills | 0 days | Status: AC

## 2018-03-20 ENCOUNTER — Telehealth: Payer: Self-pay | Admitting: *Deleted

## 2018-03-20 ENCOUNTER — Ambulatory Visit: Payer: Medicare Other | Admitting: Internal Medicine

## 2018-03-20 NOTE — Telephone Encounter (Signed)
Dr Archie Balboa would like to speak with Dr Orlie Dakin regarding some infusions on this patient . Please return call. [phone]

## 2018-03-20 NOTE — Telephone Encounter (Signed)
Call returned.

## 2018-03-23 NOTE — Unmapped (Signed)
I spoke with Dr. Orlie Dakin (her hematologist) about taking cautious approach with her next iron infusion given her possible reaction vs atopic dermatitis flare. Pt's with AD and connective tissue disease are at greater risk of developing a reaction after infusion but this is typically a standard infusion reaction - her presentation was with significant facial swelling and itching (could be an atopic flare unrelated to the infusion but given timing I was not sure). He will pretreat with benadryl prior to next infusion.

## 2018-03-26 DIAGNOSIS — M810 Age-related osteoporosis without current pathological fracture: Secondary | ICD-10-CM | POA: Diagnosis not present

## 2018-03-26 DIAGNOSIS — M1990 Unspecified osteoarthritis, unspecified site: Secondary | ICD-10-CM | POA: Diagnosis not present

## 2018-03-26 DIAGNOSIS — Z79899 Other long term (current) drug therapy: Secondary | ICD-10-CM | POA: Diagnosis not present

## 2018-03-26 DIAGNOSIS — M545 Low back pain: Secondary | ICD-10-CM | POA: Diagnosis not present

## 2018-03-26 DIAGNOSIS — L209 Atopic dermatitis, unspecified: Secondary | ICD-10-CM | POA: Diagnosis not present

## 2018-03-31 DIAGNOSIS — M810 Age-related osteoporosis without current pathological fracture: Secondary | ICD-10-CM | POA: Diagnosis not present

## 2018-04-02 ENCOUNTER — Other Ambulatory Visit: Payer: Self-pay | Admitting: Oncology

## 2018-04-02 DIAGNOSIS — D509 Iron deficiency anemia, unspecified: Secondary | ICD-10-CM

## 2018-04-06 ENCOUNTER — Ambulatory Visit: Payer: Medicare Other | Attending: Family | Admitting: Family

## 2018-04-06 ENCOUNTER — Encounter: Payer: Self-pay | Admitting: Family

## 2018-04-06 VITALS — BP 138/70 | HR 68 | Resp 18 | Ht 60.0 in | Wt 100.5 lb

## 2018-04-06 DIAGNOSIS — I10 Essential (primary) hypertension: Secondary | ICD-10-CM

## 2018-04-06 DIAGNOSIS — E079 Disorder of thyroid, unspecified: Secondary | ICD-10-CM | POA: Insufficient documentation

## 2018-04-06 DIAGNOSIS — I5032 Chronic diastolic (congestive) heart failure: Secondary | ICD-10-CM

## 2018-04-06 DIAGNOSIS — D649 Anemia, unspecified: Secondary | ICD-10-CM | POA: Insufficient documentation

## 2018-04-06 DIAGNOSIS — M810 Age-related osteoporosis without current pathological fracture: Secondary | ICD-10-CM | POA: Insufficient documentation

## 2018-04-06 DIAGNOSIS — I509 Heart failure, unspecified: Secondary | ICD-10-CM | POA: Insufficient documentation

## 2018-04-06 DIAGNOSIS — Z79899 Other long term (current) drug therapy: Secondary | ICD-10-CM | POA: Insufficient documentation

## 2018-04-06 DIAGNOSIS — Z79890 Hormone replacement therapy (postmenopausal): Secondary | ICD-10-CM | POA: Diagnosis not present

## 2018-04-06 DIAGNOSIS — I110 Hypertensive heart disease with heart failure: Secondary | ICD-10-CM | POA: Diagnosis not present

## 2018-04-06 DIAGNOSIS — D509 Iron deficiency anemia, unspecified: Secondary | ICD-10-CM

## 2018-04-06 NOTE — Patient Instructions (Signed)
Continue weighing daily and call for an overnight weight gain of > 2 pounds or a weekly weight gain of >5 pounds. 

## 2018-04-06 NOTE — Progress Notes (Signed)
[facility]  [phone] [phone]  ID: Katherine Baird OB: Sep 14, 1950  MR#: [id number]  VQQ#:[id number]  Patient Care Team: Reubin Milan, MD as PCP - General (Internal Medicine) Lamar Blinks, MD as Consulting Physician (Cardiology) Claiborne Billings, MD as Referring Physician (Obstetrics and Gynecology) Fritzi Mandes, MD as Referring Physician (Dermatology) Deeann Saint, MD (Orthopedic Surgery)  CHIEF COMPLAINT: Iron deficiency anemia.  INTERVAL HISTORY: Patient returns to clinic today for repeat laboratory work, further evaluation, and consideration of additional IV Feraheme.  Continues to have chronic weakness and fatigue that is unchanged.  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 She has no neurologic complaints.  She denies any recent fevers or illnesses.  She has a good appetite and denies weight loss.  She has no chest pain or shortness of breath.  She denies any nausea, vomiting, constipation, or diarrhea.  She has no melena or hematochezia.  She has no urinary complaints.  Patient offers no further specific complaints today.  REVIEW OF SYSTEMS:   Review of Systems  Constitutional: Positive for malaise/fatigue. Negative for fever and weight loss.  Respiratory: Negative.  Negative for cough and shortness of breath.   Cardiovascular: Negative.  Negative for chest pain and leg swelling.  Gastrointestinal: Negative.  Negative for abdominal pain, blood in stool and melena.  Genitourinary: Negative.  Negative for hematuria.  Musculoskeletal: Negative.  Negative for back pain.  Skin: Negative.  Negative for rash.  Neurological: Positive for weakness. Negative for sensory change and focal weakness.  Psychiatric/Behavioral: Negative.  The patient is not nervous/anxious.     As per HPI. Otherwise, a complete review of systems is negative.  PAST MEDICAL HISTORY: Past Medical History:  Diagnosis  Date  . CHF (congestive heart failure) (HCC)   . Hypertension   . Osteoporosis   . Thyroid disease     PAST SURGICAL HISTORY: Past Surgical History:  Procedure Laterality Date  . ABDOMINAL HYSTERECTOMY    . LAPAROSCOPIC HYSTERECTOMY  2001   total    FAMILY HISTORY: Family History  Problem Relation Age of Onset  . Arthritis Mother   . Dementia Mother   . Heart disease Father   . Mesothelioma Father   . Kidney cancer Sister   . Heart disease Sister   . Arthritis Sister   . COPD Brother   . Arthritis Brother   . Arthritis Brother   . COPD Sister     ADVANCED DIRECTIVES (Y/N):  N  HEALTH MAINTENANCE: Social History   Tobacco Use  . Smoking status: Never Smoker  . Smokeless tobacco: Never Used  Substance Use Topics  . Alcohol use: No    Alcohol/week: 0.0 standard drinks  . Drug use: No     Colonoscopy:  PAP:  Bone density:  Lipid panel:  Allergies  Allergen Reactions  . Levofloxacin Nausea And Vomiting  . Doxycycline   . Erythromycin   . Gabapentin   . Guaifenesin     "Felt sick" and worsening of symptoms  . Nitrofuran Derivatives   . Prednisone     muscle weakness/pain  . Sulfa Antibiotics   . Tetracycline   . Azithromycin Rash    Severe rash with peeling  . Carvedilol Other (See Comments)    Dose problem with higher doses    Current Outpatient Medications  Medication Sig Dispense Refill  . carvedilol (COREG) 6.25 MG tablet Take 1 tablet by mouth 2 (two) times daily with  a meal.     . cholecalciferol (VITAMIN D) 400 units TABS tablet Take 400 Units by mouth daily.    . clobetasol ointment (TEMOVATE) 0.05 % Apply 1 application topically 2 (two) times daily.     Marland Kitchen denosumab (PROLIA) 60 MG/ML SOSY injection Inject 60 mg into the skin every 6 (six) months.    . folic acid (FOLVITE) 1 MG tablet Take 1 mg by mouth daily.    Marland Kitchen levothyroxine (SYNTHROID, LEVOTHROID) 50 MCG tablet 50 mcg daily before breakfast.     . pseudoephedrine (SUDAFED) 120 MG 12  hr tablet Take 120 mg by mouth daily as needed for congestion.     No current facility-administered medications for this visit.     OBJECTIVE: Vitals:   04/10/18 1051  BP: 137/69  Pulse: 71  Resp: 20  Temp: 97.8 F (36.6 C)     Body mass index is 19.89 kg/m.    ECOG FS:0 - Asymptomatic  General: Well-developed, well-nourished, no acute distress. Eyes: Pink conjunctiva, anicteric sclera. HEENT: Normocephalic, moist mucous membranes. Lungs: Clear to auscultation bilaterally. Heart: Regular rate and rhythm. No rubs, murmurs, or gallops. Abdomen: Soft, nontender, nondistended. No organomegaly noted, normoactive bowel sounds. Musculoskeletal: No edema, cyanosis, or clubbing. Neuro: Alert, answering all questions appropriately. Cranial nerves grossly intact. Skin: No rashes or petechiae noted. Psych: Normal affect.  LAB RESULTS:  Lab Results  Component Value Date   NA 143 12/18/2017   K 4.3 12/18/2017   CL 106 12/18/2017   CO2 25 12/18/2017   GLUCOSE 98 12/18/2017   BUN 11 12/18/2017   CREATININE 0.89 12/18/2017   CALCIUM 9.5 12/18/2017   PROT 7.6 12/18/2017   ALBUMIN 4.6 12/18/2017   AST 18 12/18/2017   ALT 6 12/18/2017   ALKPHOS 106 12/18/2017   BILITOT 0.4 12/18/2017   GFRNONAA 68 12/18/2017   GFRAA 78 12/18/2017    Lab Results  Component Value Date   WBC 5.7 04/07/2018   NEUTROABS 3.2 04/07/2018   HGB 11.4 (L) 04/07/2018   HCT 34.6 (L) 04/07/2018   MCV 83.3 04/07/2018   PLT 271 04/07/2018   Lab Results  Component Value Date   IRON 76 04/07/2018   TIBC 209 (L) 04/07/2018   IRONPCTSAT 36 (H) 04/07/2018   Lab Results  Component Value Date   FERRITIN 246 04/07/2018     STUDIES: No results found.  ASSESSMENT: Iron deficiency anemia  PLAN:    1. Iron deficiency anemia: Patient's hemoglobin has significantly improved to 11.4 and her iron stores are now within normal limits. By report, patient cannot tolerate oral iron supplementation.  No  intervention is needed at this time.  Patient does not require additional IV Feraheme.  Return to clinic in 3 months with repeat laboratory work and further evaluation.  2.  Weakness and fatigue: Likely multifactorial.  Unrelated to anemia. 3.  History of skin eruption: Occurred several days after her treatment with Feraheme.  Unclear if related to infusion, but patient may benefit from Benadryl and Tylenol premedication in the future.  I spent a total of 20 minutes face-to-face with the patient of which greater than 50% of the visit was spent in counseling and coordination of care as detailed above.  Patient expressed understanding and was in agreement with this plan. She also understands that She can call clinic at any time with any questions, concerns, or complaints.    Jeralyn Ruths, MD   04/10/2018 1:18 PM

## 2018-04-06 NOTE — Progress Notes (Signed)
Patient ID: Katherine Baird, female    DOB: 28-Oct-1950, 67 y.o.   MRN: [id number]  HPI  Ms Katherine Baird is a 66 y/o female with a history of osteoporosis (multiple spontaneous fractures), HTN and chronic heart failure.   Echo report from 01/22/18 reviewed and showed an EF of 50-55%. Echo report from 10/30/16 reviewed and showed an EF of 50% along with mild MR/TR.  Has not been admitted or been in the ED in the last 6 months.   She presents today for a follow-up visit with a chief complaint of moderate fatigue upon minimal exertion. She says that this has been chronic in nature having been present for several years. She has associated difficulty sleeping along with this. She denies any abdominal distention, palpitations, pedal edema, chest pain, shortness of breath, dizziness or weight gain. She says that she's only sleeping from ~ 3-7am every night. She says that her mom goes to sleep "fairly well" but that she stays up late "to get things done".   Past Medical History:  Diagnosis Date  . CHF (congestive heart failure) (HCC)   . Hypertension   . Osteoporosis   . Thyroid disease    Past Surgical History:  Procedure Laterality Date  . ABDOMINAL HYSTERECTOMY    . LAPAROSCOPIC HYSTERECTOMY  2001   total   Family History  Problem Relation Age of Onset  . Arthritis Mother   . Dementia Mother   . Heart disease Father   . Mesothelioma Father   . Kidney cancer Sister   . Heart disease Sister   . Arthritis Sister   . COPD Brother   . Arthritis Brother   . Arthritis Brother   . COPD Sister    Social History   Tobacco Use  . Smoking status: Never Smoker  . Smokeless tobacco: Never Used  Substance Use Topics  . Alcohol use: No    Alcohol/week: 0.0 standard drinks   Allergies  Allergen Reactions  . Levofloxacin Nausea And Vomiting  . Doxycycline   . Erythromycin   . Gabapentin   . Guaifenesin     "Felt sick" and worsening of symptoms  . Nitrofuran Derivatives   . Prednisone    muscle weakness/pain  . Sulfa Antibiotics   . Tetracycline   . Azithromycin Rash    Severe rash with peeling  . Carvedilol Other (See Comments)    Dose problem with higher doses   Prior to Admission medications   Medication Sig Start Date End Date Taking? Authorizing Provider  calcium carbonate (TUMS - DOSED IN MG ELEMENTAL CALCIUM) 500 MG chewable tablet Chew 2 tablets by mouth daily.   Yes [provider]  carvedilol (COREG) 6.25 MG tablet Take 1 tablet by mouth 2 (two) times daily with a meal.    Yes [provider]  cholecalciferol (VITAMIN D) 400 units TABS tablet Take 400 Units by mouth daily.   Yes [provider]  clobetasol ointment (TEMOVATE) 0.05 % Apply 1 application topically 2 (two) times daily.  01/29/18  Yes [provider]  denosumab (PROLIA) 60 MG/ML SOSY injection Inject 60 mg into the skin every 6 (six) months.   Yes [provider]  folic acid (FOLVITE) 1 MG tablet Take 1 mg by mouth daily.   Yes [provider]  levothyroxine (SYNTHROID, LEVOTHROID) 50 MCG tablet 50 mcg daily before breakfast.  02/25/18  Yes [provider]  Methotrexate 2.5 MG/ML SOLN Inject 5 mg into the skin once a week.  Yes [provider]  ondansetron (ZOFRAN) 4 MG tablet Take 1 tablet (4 mg total) by mouth every 8 (eight) hours as needed for nausea or vomiting. Patient taking differently: Take 4 mg by mouth every 8 (eight) hours as needed for nausea or vomiting.  03/06/18  Yes Katherine Milan, MD  pseudoephedrine (SUDAFED) 120 MG 12 hr tablet Take 120 mg by mouth daily as needed for congestion.   Yes [provider]  vitamin C (ASCORBIC ACID) 500 MG tablet Take 500 mg by mouth daily.   Yes [provider]    Review of Systems  Constitutional: Positive for fatigue (worse with heat). Negative for appetite change.  HENT: Negative for congestion, rhinorrhea and sore throat.   Eyes: Negative.   Respiratory:  Negative for chest tightness and shortness of breath.   Cardiovascular: Negative for chest pain, palpitations and leg swelling.  Gastrointestinal: Negative for abdominal distention and abdominal pain.  Endocrine: Negative.   Genitourinary: Negative.   Musculoskeletal: Positive for arthralgias (right lower leg). Negative for back pain.  Skin: Negative.   Allergic/Immunologic: Negative.   Neurological: Negative for dizziness and light-headedness.  Hematological: Negative for adenopathy. Does not bruise/bleed easily.  Psychiatric/Behavioral: Positive for dysphoric mood (at times) and sleep disturbance (due to staying with mom with alzheimer's). The patient is not nervous/anxious.    Vitals:   04/06/18 1039  BP: 138/70  Pulse: 68  Resp: 18  SpO2: 100%  Weight: 100 lb 8 oz (45.6 kg)  Height: 5' (1.524 m)   Wt Readings from Last 3 Encounters:  04/06/18 100 lb 8 oz (45.6 kg)  03/06/18 105 lb (47.6 kg)  02/13/18 109 lb 6 oz (49.6 kg)   Lab Results  Component Value Date   CREATININE 0.89 12/18/2017   CREATININE 0.66 07/29/2016   CREATININE 0.7 10/31/2014    Physical Exam  Constitutional: She is oriented to person, place, and time. She appears well-developed and well-nourished.  HENT:  Head: Normocephalic and atraumatic.  Neck: Normal range of motion. Neck supple. No JVD present.  Cardiovascular: Regular rhythm and normal heart sounds.  Pulmonary/Chest: Effort normal. She has no wheezes. She has no rales.  Abdominal: Soft. She exhibits no distension. There is no tenderness.  Musculoskeletal: She exhibits no edema or tenderness.  Neurological: She is alert and oriented to person, place, and time.  Skin: Skin is warm and dry.  Psychiatric: She has a normal mood and affect. Her behavior is normal. Thought content normal.  Nursing note and vitals reviewed.   Assessment & Plan:  1: Chronic heart failure with preserved ejection fraction- - NYHA class III - euvolemic today -  reminded to weigh daily and to call for an overnight weight gain of >2 pounds or a weekly weight gain of >5 pounds - weight down 8 pounds since she was last here ~ 3 months ago - not adding salt nor does she cook with salt. Reviewed the importance of closely following a 2000mg  sodium diet  - encouraged her to get rest when she can (taking care of mom who has alzheimer's); gets ~ 4 hours of sleep a night - saw cardiology ) 10/16/17 - PharmD reconciled medications  2: HTN- - BP looks good today - saw PCP 10/18/17) 03/06/18 - BMP from 12/18/17 reviewed and showed sodium 143, potassium 4.3 and GFR 68  3: Anemia- - receiving iron infusions as needed  Patient did not bring her medications nor a list. Each medication was verbally reviewed with the patient  and she was encouraged to bring the bottles to every visit to confirm accuracy of list.  Will not make a return appointment at this time. Advised patient that she could call back at anytime to make another appointment.

## 2018-04-07 ENCOUNTER — Inpatient Hospital Stay: Payer: Medicare Other | Attending: Oncology

## 2018-04-07 DIAGNOSIS — R5382 Chronic fatigue, unspecified: Secondary | ICD-10-CM | POA: Insufficient documentation

## 2018-04-07 DIAGNOSIS — R5381 Other malaise: Secondary | ICD-10-CM | POA: Insufficient documentation

## 2018-04-07 DIAGNOSIS — D509 Iron deficiency anemia, unspecified: Secondary | ICD-10-CM | POA: Diagnosis not present

## 2018-04-07 DIAGNOSIS — I509 Heart failure, unspecified: Secondary | ICD-10-CM | POA: Insufficient documentation

## 2018-04-07 DIAGNOSIS — E079 Disorder of thyroid, unspecified: Secondary | ICD-10-CM | POA: Insufficient documentation

## 2018-04-07 DIAGNOSIS — I110 Hypertensive heart disease with heart failure: Secondary | ICD-10-CM | POA: Diagnosis not present

## 2018-04-07 DIAGNOSIS — M810 Age-related osteoporosis without current pathological fracture: Secondary | ICD-10-CM | POA: Diagnosis not present

## 2018-04-07 DIAGNOSIS — R531 Weakness: Secondary | ICD-10-CM | POA: Insufficient documentation

## 2018-04-07 DIAGNOSIS — Z8051 Family history of malignant neoplasm of kidney: Secondary | ICD-10-CM | POA: Diagnosis not present

## 2018-04-07 DIAGNOSIS — Z79899 Other long term (current) drug therapy: Secondary | ICD-10-CM | POA: Diagnosis not present

## 2018-04-07 LAB — CBC WITH DIFFERENTIAL/PLATELET
Basophils Absolute: 0.1 10*3/uL (ref 0–0.1)
Basophils Relative: 1 %
EOS ABS: 0.4 10*3/uL (ref 0–0.7)
EOS PCT: 7 %
HCT: 34.6 % — ABNORMAL LOW (ref 35.0–47.0)
Hemoglobin: 11.4 g/dL — ABNORMAL LOW (ref 12.0–16.0)
LYMPHS ABS: 1.5 10*3/uL (ref 1.0–3.6)
Lymphocytes Relative: 26 %
MCH: 27.4 pg (ref 26.0–34.0)
MCHC: 32.9 g/dL (ref 32.0–36.0)
MCV: 83.3 fL (ref 80.0–100.0)
MONO ABS: 0.5 10*3/uL (ref 0.2–0.9)
Monocytes Relative: 9 %
Neutro Abs: 3.2 10*3/uL (ref 1.4–6.5)
Neutrophils Relative %: 57 %
PLATELETS: 271 10*3/uL (ref 150–440)
RBC: 4.15 MIL/uL (ref 3.80–5.20)
RDW: 21.1 % — AB (ref 11.5–14.5)
WBC: 5.7 10*3/uL (ref 3.6–11.0)

## 2018-04-07 LAB — FERRITIN: Ferritin: 246 ng/mL (ref 11–307)

## 2018-04-07 LAB — IRON AND TIBC
Iron: 76 ug/dL (ref 28–170)
SATURATION RATIOS: 36 % — AB (ref 10.4–31.8)
TIBC: 209 ug/dL — AB (ref 250–450)
UIBC: 133 ug/dL

## 2018-04-10 ENCOUNTER — Inpatient Hospital Stay: Payer: Medicare Other

## 2018-04-10 ENCOUNTER — Inpatient Hospital Stay (HOSPITAL_BASED_OUTPATIENT_CLINIC_OR_DEPARTMENT_OTHER): Payer: Medicare Other | Admitting: Oncology

## 2018-04-10 ENCOUNTER — Encounter: Payer: Self-pay | Admitting: Oncology

## 2018-04-10 VITALS — BP 137/69 | HR 71 | Temp 97.8°F | Resp 20 | Wt 101.9 lb

## 2018-04-10 DIAGNOSIS — I110 Hypertensive heart disease with heart failure: Secondary | ICD-10-CM | POA: Diagnosis not present

## 2018-04-10 DIAGNOSIS — D509 Iron deficiency anemia, unspecified: Secondary | ICD-10-CM

## 2018-04-10 DIAGNOSIS — R5381 Other malaise: Secondary | ICD-10-CM

## 2018-04-10 DIAGNOSIS — R531 Weakness: Secondary | ICD-10-CM

## 2018-04-10 DIAGNOSIS — I509 Heart failure, unspecified: Secondary | ICD-10-CM

## 2018-04-10 DIAGNOSIS — E079 Disorder of thyroid, unspecified: Secondary | ICD-10-CM

## 2018-04-10 DIAGNOSIS — R5382 Chronic fatigue, unspecified: Secondary | ICD-10-CM | POA: Diagnosis not present

## 2018-04-10 DIAGNOSIS — Z79899 Other long term (current) drug therapy: Secondary | ICD-10-CM | POA: Diagnosis not present

## 2018-04-10 DIAGNOSIS — Z8051 Family history of malignant neoplasm of kidney: Secondary | ICD-10-CM | POA: Diagnosis not present

## 2018-04-10 DIAGNOSIS — M810 Age-related osteoporosis without current pathological fracture: Secondary | ICD-10-CM

## 2018-04-10 NOTE — Progress Notes (Signed)
Patient reports fatigue and nausea today.

## 2018-04-21 DIAGNOSIS — E039 Hypothyroidism, unspecified: Secondary | ICD-10-CM | POA: Diagnosis not present

## 2018-04-22 ENCOUNTER — Other Ambulatory Visit: Payer: Self-pay | Admitting: Internal Medicine

## 2018-04-22 DIAGNOSIS — E039 Hypothyroidism, unspecified: Secondary | ICD-10-CM

## 2018-04-23 NOTE — Unmapped (Addendum)
[facility]  Evaluation of Established Patient - Pessary Check    Referring Provider: Kerrie Pleasure, MD  PCP: Reubin Milan, MD  Date of Service: 04/24/2018    Chief Complaint: Pessary Check and Possible UTI    History of Present Illness:  Marilyn Fry is a 67 y.o. female scheduled today for a follow-up appointment for a pessary check. She is using a 2 ring with support pessary for prolapse.  She denies vaginal bleeding.  She denies vaginal discharge.  She is voiding and defecating without difficulty.    She thinks she has  UTI.     Urogynecologic History and Treatment Summary:  Previously used #3 RWS pessary  01/29/18 fit with 1-3/4 short gelhorn pessary  03/12/18 refit with #2 RWS (attempted #3 RWS too large, #1 RWS expelled)    Physical Exam:  BP 171/82 (BP Site: L Arm, BP Position: Sitting, BP Cuff Size: Small)  - Pulse 69  - Ht 149.9 cm (4' 11)  - Wt 49.9 kg (110 lb)  - BMI 22.22 kg/m??    Constitutional:  General appearance: Well nourished, well developed female in no acute distress.   External genitalia: normal external genitalia, no erythema, no discharge  The pessary was removed without difficulty.  Speculum exam was performed and the vaginal mucosa appeared healthy with some superficial abrasions.  The pessary was cleaned and replaced without difficulty.    Results for orders placed or performed in visit on 04/24/18   POCT Urinalysis Dipstick   Result Value Ref Range    Spec Gravity/POC 1.010 1.003 - 1.030    PH/POC 7.0 5.0 - 9.0    Leuk Esterase/POC 3+ (A) Negative    Nitrite/POC Negative Negative    Protein/POC Trace (A) Negative    UA Glucose/POC Negative Negative    Ketones, POC Negative Negative    Bilirubin/POC Negative Negative    Blood/POC 1+ (A) Negative    Urobilinogen/POC 0.2 0.2 - 1.0 mg/dL       Assessment and Plan:  Marilyn Fry is a 67 y.o. with:   1. Pessary maintenance    2. Vaginal atrophy    3. UTI symptoms      Doing well with pessary.      UTI: In the future I instructed the patient that if she thinks she has a UTI she should have a urine culture sent for confirmation and request the results be sent to our office.  - Urine culture.   - Empiric treatment with Augmentin.    - Call or come in if she develops fever (T > 100.4 F), chills, persistent nausea/vomiting, back pain or other concerns.       Return in about 3 months (around 07/25/2018) for pessary check with APP.    New Prescriptions    AMOXICILLIN-CLAVULANATE (AUGMENTIN) 875-125 MG PER TABLET    Take 1 tablet by mouth Two (2) times a day. for 5 days     Orders Placed This Encounter   Procedures   ??? Urine Culture      Time:  The total time for the visit was 15 minutes, of which greater than 50% was spent in face to face counseling with the patient.  Lil Lepage A. Jenne Campus, MD

## 2018-04-24 ENCOUNTER — Encounter
Admit: 2018-04-24 | Discharge: 2018-04-25 | Payer: MEDICARE | Attending: Obstetrics & Gynecology | Primary: Obstetrics & Gynecology

## 2018-04-24 DIAGNOSIS — R399 Unspecified symptoms and signs involving the genitourinary system: Secondary | ICD-10-CM

## 2018-04-24 DIAGNOSIS — N952 Postmenopausal atrophic vaginitis: Secondary | ICD-10-CM

## 2018-04-24 DIAGNOSIS — Z4689 Encounter for fitting and adjustment of other specified devices: Principal | ICD-10-CM

## 2018-04-24 DIAGNOSIS — Z6822 Body mass index (BMI) 22.0-22.9, adult: Secondary | ICD-10-CM | POA: Diagnosis not present

## 2018-04-24 MED ORDER — AMOXICILLIN 875 MG-POTASSIUM CLAVULANATE 125 MG TABLET
ORAL_TABLET | Freq: Two times a day (BID) | ORAL | 0 refills | 0.00000 days | Status: CP
Start: 2018-04-24 — End: 2018-04-29

## 2018-04-24 NOTE — Unmapped (Addendum)
PLAN TODAY:  - Follow-up for your next pessary check in approximately 3 months from today. Until your next visit:  ?? Please call our office if your pessary comes out and you cannot replace it or if you are having pain, heavy bleeding or other concerns.    - UTI: A urine culture was sent today to diagnost UTI.  It usually takes approximately 3-4 business days for the results and we will contact you if you need additional treatment.    ?? Start your antibiotic today and complete the full course  ?? Please seek medical evaluation if you develop fever (T > 100.4 F), chills, persistent nausea/vomiting, back pain or other concerns.  ?? In the future if you think you have an infection please make sure your PCP or urgent care sends a urine culture test to confirm/rule out an infection.        QUESTIONS?  To learn more about pelvic floor disorders and our [facility] Urogynecology group please visit: [email or web address].    Another helpful website is: [email or web address]    If you have further questions or concerns, please call our Urogynecology Triage Nurse:  o [place] Triage Nurse: [phone]  Please leave a message and someone will contact you within 1-2 business days.  For urgent issues after 4:30PM weekdays and on weekends, call the [facility] operator at [phone] and have them page the [facility] Gynecology Resident on call.  Please reserve this option for important issues only. For emergencies go to your nearest Emergency Room or call 911.

## 2018-04-24 NOTE — Unmapped (Signed)
Addended by: Kathaleen Maser A on: 04/24/2018 11:36 AM     Modules accepted: Orders

## 2018-04-29 NOTE — Unmapped (Signed)
[facility] Medication Referral: PA APPROVED    Medication (Brand/Generic): DUPIXENT    Initial Test Claim completed with resulted information below:  No PA required  Patient ABLE to fill at [facility]  Insurance Company:  [employer]  Anticipated Copay: $483.18  Is anticipated copay with a copay card or grant? NO, PATIENT NEVER RETURNED CALLS FROM [facility] FOR ASSISTANCE    As Co-pay is under $100 defined limit, per policy there will be no further investigation of need for financial assistance at this time unless patient requests. This referral has been communicated to the provider and handed off to the [facility] team for further processing and filling of prescribed medication.   ______________________________________________________________________  Please utilize this referral for viewing purposes as it will serve as the central location for all relevant documentation and updates.

## 2018-04-30 ENCOUNTER — Encounter: Admit: 2018-04-30 | Discharge: 2018-05-01 | Payer: MEDICARE

## 2018-04-30 DIAGNOSIS — L0100 Impetigo, unspecified: Principal | ICD-10-CM

## 2018-04-30 MED ORDER — CEPHALEXIN 500 MG CAPSULE
ORAL_CAPSULE | Freq: Four times a day (QID) | ORAL | 0 refills | 0.00000 days | Status: CP
Start: 2018-04-30 — End: 2018-05-07

## 2018-04-30 NOTE — Unmapped (Signed)
Patient Education        Impetigo: Care Instructions  Your Care Instructions  Impetigo (say im-puh-TY-go) is a skin infection caused by bacteria. It causes blisters that break and become oozing, yellow, crusty sores.  Impetigo can be anywhere on the body. Scratching the sores may spread the infection to other parts of the body. You can also spread it to others through close contact or when you share towels, clothing, and other items.  Prescription antibiotic ointment or pills can usually cure impetigo. (After a day of antibiotics, the infection should not spread.)  Follow-up care is a key part of your treatment and safety. Be sure to make and go to all appointments, and call your doctor if you are having problems. It's also a good idea to know your test results and keep a list of the medicines you take.  How can you care for yourself at home?  ?? Apply antibiotic ointment exactly as instructed.  ?? If your doctor prescribed antibiotic pills, take them as directed. Do not stop using them just because you feel better. You need to take the full course of antibiotics.  ?? Gently wash the sores with soap and water each day. If crusts form, your doctor may advise you to soften or remove the crusts. You can do this by soaking them in warm water and patting them dry. This can help the cream or ointment treat impetigo.  ?? After you touch the area, wash your hands with soap and water. Or you can use an alcohol-based hand sanitizer.  ?? Don't share items such as towels, sheets, and clothing until the infection is gone.  ?? Wash anything that may have touched the infected area.  ?? Try to avoid scratching the area.  When should you call for help?  Call your doctor now or seek immediate medical care if:  ?? ?? You have symptoms of a worse infection, such as:  ? Increased pain, swelling, warmth, or redness.  ? Red streaks leading from the area.  ? Pus draining from the area.  ? A fever.   ?? ?? Impetigo gets worse or spreads to other areas.   ??Watch closely for changes in your health, and be sure to contact your doctor if:  ?? ?? You do not get better as expected.   Where can you learn more?  Go to [facility] at [email or web address]  Select Preferences in the upper right hand corner, then select Health Library under Resources. Enter [phone] in the search box to learn more about Impetigo: Care Instructions.  Current as of: August 06, 2017  Content Version: 12.1  ?? 2006-2019 [employer], Incorporated. Care instructions adapted under license by [facility]. If you have questions about a medical condition or this instruction, always ask your healthcare professional. Healthwise, Incorporated disclaims any warranty or liability for your use of this information.

## 2018-04-30 NOTE — Unmapped (Signed)
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 She has a history of multiple superinfections (bacterial and viral) due to her severe AD  ?? Have added back clobetasol ointment for recalcitrant areas on extremities  ?? Triamcinolone ointment for body  ?? Insurance had approved dupilumab but her monthly copay is very high. She is working with out shared services pharmacy to inquire about copay reduction programs  ?? If she is unable to obtain dupilumab, can consider methotrexate (may help with connective tissue disease as well). She will discuss further with Dr. Gavin Potters.    (2) Distinct facial rash: improved    ?? Given timing - developed 1-2 days after 2nd IV iron infusion  ?? Her hematologist, Dr. Marcell Anger is aware - no plan on further iron infusions for now    (3) Impetigo:    ?? Keflex 500mg  4x/day for 7 days to treat superinfection which may help her underlying atopic as well     F/U in 6 weeks    Total time spent with patient was 25 minutes and greater than 50% of that time was spent in counseling and coordinating care with the patient.       Subjective:     REASON FOR VISIT: established patient here for evaluation of chronic rash     Marilyn Fry is a 67 y.o. yo female who was last seen by me 03/19/18.  Pt has a history of severe atopic dermatitis for several years and has seen many dermatologists in the past. She also has a history of undifferentiated connective tissue disease and is followed by Dr. Lavenia Atlas at the [facility] in [place], [place]. She had been on plaquenil which helped maintain control of her AD as well. She had been on methotrexate but was suffering from significant side effects and was switched.     During her last visit, she was flaring significantly - she had received an iron infusion which caused a significant amount of swelling and rash on her face. I spoke with her hematologist since there are reports of iron infusions causing flares of underlying atopic derm or connective tissue disease (she has both). She has not needed further iron infusions and her facial rash is back to baseline.     I also sent an Rx for dupilumab which was approved by her insurance but her copay is still very high. Our shared services pharmacy has been trying to contact her about obtaining further information to see if she qualifies for copay reduction - she has not yet been in touch with them.     She continues to have significant disease activity.     Rash (severe atopic derm with possible component of allergic contact)  Location: face, trunk, extremities  Duration: years  Character: itchy, scaly, rough  Treatment(s): currently none   Aggravating factor(s): perfumes, smoking  Alleviating factor(s): plaquenil, prednisone, wet wraps    Previous Treatment(s): above     Relevant History:     Disease history:    ?? Several year history of atopic dermatitis as well as undifferentiated CT disease (managed by Dr. Gavin Potters in [place], [place])  ?? Admitted for wet wraps 6/16 with significant improvement  ?? 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 Prescribed dupilumab Summer of 2018 but did not fill this    Past Medical History   Diagnosis Date   ???  Arthritis      likely osteoarthritis    ??? 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 Disease of thyroid gland    ??? Undifferentiated connective tissue disease (RAF-HCC)    ??? 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 aspirin 325 MG tablet Take 325 mg by mouth daily.    ??? carvedilol (COREG) 6.25 MG tablet Take 6.25 mg by mouth daily.    ??? desonide (DESOWEN) 0.05 % ointment Apply topically two (2) times a day as needed. to red scaly areas until smooth on face and folds   ??? diphenhydrAMINE (BENADRYL) 25 mg capsule Take 25 mg by mouth every six (6) hours as needed. As needed   ??? folic acid (FOLVITE) 1 MG tablet Take 1 mg by mouth daily.   ??? hydroxychloroquine (PLAQUENIL) 200 mg tablet Take 200 mg by mouth nightly.   ??? levothyroxine (SYNTHROID, LEVOTHROID) 25 MCG tablet Take 25 mcg by mouth daily at 0600.   ??? predniSONE (DELTASONE) 10 MG tablet Take 3 tablets (30 mg total) by mouth daily. On 7/5 & 7/6 take 4 tabs (40mg )   ??? pseudoephedrine (SUDAFED 12 HOUR) 120 mg 12 hr tablet Take 120 mg by mouth daily at 0600.    ??? triamcinolone (KENALOG) 0.1 % ointment Apply topically Two (2) times a day. to red scaly areas twice daily until clear from neck down until smooth. Avoid face and folds     No current facility-administered medications on file prior to visit.       Allergies   Allergen Reactions   ??? Prednisone Other (See Comments)     Oral causes SEVERE hip and leg pain to the point she cannot walk.  However, she can take the prednisone shot without any adverse reactions.   ??? Azithromycin Rash     Severe rash with peeling   ??? Doxycycline Rash   ??? Erythromycin Rash   ??? Guaifenesin      Felt sick and worsening of symptoms   ??? Nitrofurantoin Macrocrystalline Rash   ??? Sulfa (Sulfonamide Antibiotics) Rash   ??? Tetracycline Rash       Review of Systems:     The balance of 10 systems is negative unless otherwise documented. Severe scalp pain and fatigue. No fevers, chills, rigors    Objective:     Physical Exam: Patient is a well developed caucasian female in no apparent distress.  Alert and oriented x 3.  Skin: Focused examination of the patient's scalp, eyelids, ears, nose, lips, cheeks and neck (pt declined more extensive skin check) was significant for  -- diffuse xerosis and skin lichenification  -- a few excoriated, lichenified plaques on buttocks  -- depigmented patches on the R-neck and L-arm  -- fissures and erythema on both hands

## 2018-05-11 NOTE — Unmapped (Signed)
Call from patient.  Left message on nurse line.  States skin is worse.  Skin is burned with oozing blisters and pain.  States she is heading down path she was before.  Has been up all night for two nights due to pain.  Wants to know if a cortisone shot might help.  Waiting on Dupixent.  Would like a call from Dr. Archie Balboa.

## 2018-05-13 ENCOUNTER — Encounter: Admit: 2018-05-13 | Discharge: 2018-05-14 | Payer: MEDICARE

## 2018-05-13 DIAGNOSIS — L2084 Intrinsic (allergic) eczema: Secondary | ICD-10-CM

## 2018-05-13 DIAGNOSIS — L2089 Other atopic dermatitis: Secondary | ICD-10-CM | POA: Diagnosis not present

## 2018-05-13 MED ORDER — TRIAMCINOLONE ACETONIDE 0.1 % TOPICAL CREAM
INTRAMUSCULAR | 3 refills | 0.00000 days | Status: CP
Start: 2018-05-13 — End: ?

## 2018-05-13 MED ORDER — CLOBETASOL 0.05 % TOPICAL OINTMENT
OPHTHALMIC | 5 refills | 0.00000 days | Status: CP
Start: 2018-05-13 — End: ?

## 2018-05-13 NOTE — Unmapped (Signed)
Patient Education        mycophenolate mofetil (oral/injection)  Pronunciation:  MYE koe FEN oh late MOE fe til  Brand:  CellCept  What is the most important information I should know about mycophenolate mofetil?  This medicine can cause a miscarriage or birth defects when used during pregnancy. Both men and women using mycophenolate mofetil should use effective birth control to prevent pregnancy.  Mycophenolate mofetil may cause your body to overproduce white blood cells. This can lead to cancer, severe brain infection causing disability or death, or a viral infection causing kidney transplant failure.  Call your doctor right away if you have symptoms such as: fever, swollen glands, night sweats, weight loss, vomiting or diarrhea, burning when you urinate, a new skin lesion, any change in your mental state, weakness on one side of your body, problems with speech or vision, or tenderness near your transplanted kidney.  What is mycophenolate mofetil?  Mycophenolate mofetil weakens your body's immune system, to help keep it from rejecting a transplanted organ such as a kidney. Organ rejection happens when the immune system treats the new organ as an invader and attacks it.  Mycophenolate mofetil is used with other medicines to prevent organ rejection after a kidney, liver, or heart transplant.  Mycophenolate mofetil may also be used for purposes not listed in this medication guide.  What should I discuss with my healthcare provider before using mycophenolate mofetil?  You should not use this medicine if you are allergic to mycophenolate mofetil, mycophenolic acid (Myfortic), or to an ingredient called Polysorbate 80.  Talk with your doctor about the risks and benefits of mycophenolate mofetil. This medicine can affect your immune system, and may cause overproduction of certain white blood cells. This can lead to cancer, severe brain infection causing disability or death, or a viral infection causing kidney transplant failure.  Tell your doctor if you have ever had:  ?? a stomach ulcer or problems with digestion;  ?? hepatitis B or C;  ?? phenylketonuria, or PKU (the liquid form of this medicine may contain phenylalanine); or  ?? a rare inherited enzyme deficiency such as Lesch-Nyhan syndrome or Kelley-Seegmiller syndrome.  Mycophenolate mofetil can cause a miscarriage or birth defects when used during pregnancy. You will need to have a negative pregnancy test before and during treatment with this medicine. If you are able to get pregnant, you must use specific forms of birth control to prevent pregnancy while using mycophenolate mofetil, and for at least 6 weeks after your last dose.  You are considered able to get pregnant (even if you are not sexually active) from the age of puberty until you have been in menopause for at least 12 months in a row.  If you are a man, use effective birth control if your sex partner is able to get pregnant. Mycophenolate mofetil can also harm an unborn baby if the father is using this medicine. Keep using birth control for at least 90 days after your last dose.  This medicine can make birth control pills less effective. Follow all patient instructions about using effective non-hormonal forms of birth control.  If a pregnancy occurs during treatment, do not stop using mycophenolate mofetil. Call your doctor for instructions. Also call the Mycophenolate Pregnancy Registry ([phone]).  Mycophenolate mofetil is sometimes given to pregnant women. Your doctor will decide whether you should use this medicine if you are are unable to use other needed transplant medications.  You should not breast-feed while using mycophenolate mofetil.  How should I use mycophenolate mofetil?  Follow all directions on your prescription label and read all medication guides or instruction sheets. Use the medicine exactly as directed.  You must remain under the care of a doctor while you are using mycophenolate mofetil.  Mycophenolate mofetil injection is given as an infusion into a vein. A healthcare provider will give you this injection.  Take oral mycophenolate mofetil on an empty stomach, at least 1 hour before or 2 hours after a meal. Swallow the pill whole and do not crush, chew, break, or open it.  Shake the oral suspension (liquid) before you measure a dose. Use the dosing syringe provided, or use a medicine dose-measuring device (not a kitchen spoon).  Read and carefully follow any Instructions for Use provided with your medicine. Ask your doctor or pharmacist if you do not understand these instructions.  Mycophenolate mofetil (CellCept) and mycophenolic acid (Myfortic) are not absorbed equally in the body. Your dose needs may change if you switch to a different brand, strength, or form of this medicine.  Avoid medication errors by using only the form and strength your doctor prescribes.  You will need frequent medical tests.  If you've ever had hepatitis B or C, using mycophenolate mofetil can cause this virus to become active or get worse. You may need frequent liver function tests while using this medicine and for several months after you stop.  Store at room temperature away from moisture and heat. Keep the bottle tightly closed when not in use. Throw away any unused liquid that is older than 60 days.  The liquid medicine may also be stored in the refrigerator. Do not freeze.  What happens if I miss a dose?  Use the medicine as soon as you can, but skip the missed dose if it is almost time for your next dose. Do not use two doses at one time.  What happens if I overdose?  Seek emergency medical attention or call the Poison Help line at [phone].  What should I avoid while using mycophenolate mofetil?  You must not donate blood or sperm while using this medicine, and for at least 6 weeks (for blood) or 90 days (for sperm) after your last dose.  Do not receive a live vaccine while using mycophenolate mofetil. The vaccine may not work as well during this time, and may not fully protect you from disease. Live vaccines include measles, mumps, rubella (MMR), polio, rotavirus, typhoid, yellow fever, varicella (chickenpox), and zoster (shingles).  Mycophenolate mofetil can make you sunburn more easily. Avoid sunlight or tanning beds. Wear protective clothing and use sunscreen (SPF 30 or higher) when you are outdoors.  What are the possible side effects of mycophenolate mofetil?  Get emergency medical help if you have signs of an allergic reaction: hives; difficult breathing; swelling of your face, lips, tongue, or throat.  Mycophenolate mofetil can affect your immune system, and may cause certain white blood cells to grow out of control. Call your doctor right away if you have:  ?? fever, swollen glands, painful mouth sores, cold or flu symptoms, headache, ear pain;  ?? stomach pain, vomiting, diarrhea, weight loss;  ?? weakness on one side of your body, loss of muscle control;  ?? confusion, thinking problems, loss of interest in things that normally interest you;  ?? pain or burning when you urinate;  ?? tenderness around the transplanted kidney;  ?? swelling, warmth, redness, or oozing around a skin wound; or  ?? a new skin  lesion, or a mole that has changed in size or color.  Also call your doctor at once if you have:  ?? bloody or tarry stools, coughing up blood or vomit that looks like coffee grounds;  ?? increased blood pressure --severe headache, blurred vision, pounding in your neck or ears, anxiety, nosebleed; or  ?? low blood cell counts --fever, chills, tiredness, flu-like symptoms, mouth sores, skin sores, easy bruising, unusual bleeding, pale skin, cold hands and feet, feeling light-headed or short of breath.  Common side effects may include:  ?? stomach discomfort, nausea, vomiting, diarrhea, constipation;  ?? swelling in your ankles or feet;  ?? rash;  ?? headache, dizziness, tremors;  ?? fever, sore throat, or other signs of infections;  ?? low blood cell counts; or  ?? increased blood pressure or heart rate.  This is not a complete list of side effects and others may occur. Call your doctor for medical advice about side effects. You may report side effects to FDA at 1-800-FDA-1088.  What other drugs will affect mycophenolate mofetil?  If you take sevelamer or an antacid, take your oral mycophenolate mofetil dose 2 hours before you take these other medicines.  Tell your doctor about all your current medicines. Many drugs can interact with mycophenolate mofetil, especially:  ?? azathioprine;  ?? cholestyramine;  ?? antiviral medicine --acyclovir, ganciclovir, valacyclovir, valganciclovir;  ?? an antibiotic --amoxicillin, ciprofloxacin, metronidazole, norfloxacin, rifampin, sulfa drugs (SMX-TMP or SMZ-TMP, and others); or  ?? stomach acid reducers --esomeprazole, lansoprazole, omeprazole, Nexium, Prevacid, Prilosec, Protonix, and others.  This list is not complete and many other drugs may affect mycophenolate mofetil. This includes prescription and over-the-counter medicines, vitamins, and herbal products. Not all possible drug interactions are listed here.  Where can I get more information?  Your doctor or pharmacist can provide more information about mycophenolate mofetil.  Remember, keep this and all other medicines out of the reach of children, never share your medicines with others, and use this medication only for the indication prescribed.  Every effort has been made to ensure that the information provided by [employer] ('[employer]') is accurate, up-to-date, and complete, but no guarantee is made to that effect. Drug information contained herein may be time sensitive. [employer] information has been compiled for use by healthcare practitioners and consumers in the [place] and therefore [employer] does not warrant that uses outside of the [place] are appropriate, unless specifically indicated otherwise. [employer]'s drug information does not endorse drugs, diagnose patients or recommend therapy. [employer]'s drug information is an [employer], corporate to assist licensed healthcare practitioners in caring for their patients and/or to serve consumers viewing this service as a supplement to, and not a substitute for, the expertise, skill, knowledge and judgment of healthcare practitioners. The absence of a warning for a given drug or drug combination in no way should be construed to indicate that the drug or drug combination is safe, effective or appropriate for any given patient. [employer] does not assume any responsibility for any aspect of healthcare administered with the aid of information [employer] provides. The information contained herein is not intended to cover all possible uses, directions, precautions, warnings, drug interactions, allergic reactions, or adverse effects. If you have questions about the drugs you are taking, check with your doctor, nurse or pharmacist.  Copyright [phone] [employer] Version: 8.01. Revision date: 04/24/2017.  Care instructions adapted under license by [facility]. If you have questions about a medical condition or this instruction, always ask  your healthcare professional. Healthwise, Incorporated disclaims any warranty or liability for your use of this information.

## 2018-05-13 NOTE — Unmapped (Signed)
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 While awaiting for dupixent to be sorted out, will treat immediate flaring with IM kenalog-40 as detailed below.   ?? She has a history of multiple superinfections (bacterial and viral) due to her severe AD  ?? Continue clobetasol ointment for recalcitrant areas on extremities, refills provided  ?? Triamcinolone ointment for body, refill provided  ?? Insurance had approved dupilumab but her monthly copay is very high. She is working with out shared services pharmacy to inquire about copay reduction programs  ?? If she is unable to obtain dupilumab, can consider methotrexate (may help with connective tissue disease as well) or cellcept. She will discuss further with Dr. Gavin Potters.    INTRAMUSCULAR STEROID INJECTION PROCEDURE NOTE  Verbal informed consent was obtained prior to the procedure, after discussion of the risks (including pain, bleeding, infection, and lack of desired outcome), benefits (including improvement in skin condition), and alternatives (including no procedure). All questions were answered.  The R buttock, was/were cleaned with alcohol and injected intralesionally with kenalog (triamcinolone 0.1% solution) 40mg /cc x 1.0 cc total. The patient tolerated the procedure well without complications. Verbal post-care instructions were provided to the patient.    (2) Distinct facial rash: improved    ?? Given timing - developed 1-2 days after 2nd IV iron infusion  ?? Her hematologist, Dr. Marcell Anger is aware - no plan on further iron infusions for now     F/U in 4 weeks    Subjective:     REASON FOR VISIT: established patient here for evaluation of chronic rash     Marilyn Fry is a 67 y.o. yo female last seen by Dr. Archie Balboa in 04/30/18 for atopic dermatitis. At this visit, she was re-started on clobetasol for severe areas and TAC for body. She was also treated with keflex x7 days for superimposed impetiginization in setting of severe atopic dermatitis. She presents today for follow-up.    Today, she reports flaring diffusely over the past 3-4 days. This flaring started on the back and then spread diffusely. Has previously had IM kenalog injections in the past wit    Previous Treatment(s): above     Relevant History:     Disease history:    ?? Several year history of atopic dermatitis as well as undifferentiated CT disease (managed by Dr. Gavin Potters in [place], [place])  ?? Admitted for wet wraps 6/16 with significant improvement  ?? 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 Prescribed dupilumab Summer of 2018 but did not fill this    Past Medical History   Diagnosis Date   ??? Arthritis      likely osteoarthritis    ??? 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 Disease of thyroid gland    ??? Undifferentiated connective tissue disease (RAF-HCC)    ??? 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 aspirin 325 MG tablet Take 325 mg by mouth daily.    ??? carvedilol (COREG) 6.25 MG tablet Take 6.25 mg by mouth daily.    ??? desonide (DESOWEN) 0.05 % ointment Apply topically two (2) times a day as needed. to red scaly areas until smooth on face and folds   ??? diphenhydrAMINE (BENADRYL) 25 mg capsule Take 25 mg by mouth  every six (6) hours as needed. As needed   ??? folic acid (FOLVITE) 1 MG tablet Take 1 mg by mouth daily.   ??? hydroxychloroquine (PLAQUENIL) 200 mg tablet Take 200 mg by mouth nightly.   ??? levothyroxine (SYNTHROID, LEVOTHROID) 25 MCG tablet Take 25 mcg by mouth daily at 0600.   ??? predniSONE (DELTASONE) 10 MG tablet Take 3 tablets (30 mg total) by mouth daily. On 7/5 & 7/6 take 4 tabs (40mg )   ??? pseudoephedrine (SUDAFED 12 HOUR) 120 mg 12 hr tablet Take 120 mg by mouth daily at 0600.    ??? triamcinolone (KENALOG) 0.1 % ointment Apply topically Two (2) times a day. to red scaly areas twice daily until clear from neck down until smooth. Avoid face and folds     No current facility-administered medications on file prior to visit.       Allergies   Allergen Reactions   ??? Prednisone Other (See Comments)     Oral causes SEVERE hip and leg pain to the point she cannot walk.  However, she can take the prednisone shot without any adverse reactions.   ??? Azithromycin Rash     Severe rash with peeling   ??? Doxycycline Rash   ??? Erythromycin Rash   ??? Guaifenesin      Felt sick and worsening of symptoms   ??? Nitrofurantoin Macrocrystalline Rash   ??? Sulfa (Sulfonamide Antibiotics) Rash   ??? Tetracycline Rash       Review of Systems:     The balance of 10 systems is negative unless otherwise documented. Severe scalp pain and fatigue. No fevers, chills, rigors    Objective:     Physical Exam: Patient is a well developed caucasian female in no apparent distress.  Alert and oriented x 3.  Skin: Focused examination of the patient's scalp, eyelids, ears, nose, lips, cheeks and neck (pt declined more extensive skin check) was significant for  -- diffuse xerosis and skin lichenification  -- widespread erythematous scaly papules coalescing into plaques involving the face, back, neck, abdomen, arms and legs  -- a few excoriated, lichenified plaques on buttocks  -- fissures and erythema on both hands    The patient was seen and examined by Arvilla Market, MD who agrees with the assessment and plan as above.

## 2018-05-25 NOTE — Unmapped (Signed)
Message received requesting PA for Dupixent, PA was initiated via CMM Key: [id number] ??? PA Case ID: [id number]

## 2018-05-26 NOTE — Unmapped (Signed)
Patient called and said she needs a prior Authorization for dupixent.

## 2018-05-27 DIAGNOSIS — I10 Essential (primary) hypertension: Secondary | ICD-10-CM | POA: Diagnosis not present

## 2018-05-28 NOTE — Unmapped (Signed)
Fax received stating [place] APPROVAL for Dupixent 300/80ml, Effective until 08/25/2018. Approval letter scanned into chart under media tab.

## 2018-06-11 ENCOUNTER — Encounter: Admit: 2018-06-11 | Discharge: 2018-06-12 | Payer: MEDICARE

## 2018-06-11 DIAGNOSIS — L298 Other pruritus: Secondary | ICD-10-CM

## 2018-06-11 DIAGNOSIS — L2084 Intrinsic (allergic) eczema: Principal | ICD-10-CM

## 2018-06-11 DIAGNOSIS — Z79899 Other long term (current) drug therapy: Secondary | ICD-10-CM

## 2018-06-11 LAB — EGFR CKD-EPI NON-AA FEMALE: Lab: >90

## 2018-06-11 LAB — CBC W/ AUTO DIFF
BASOPHILS ABSOLUTE COUNT: 0.1 10*9/L (ref 0.0–0.1)
BASOPHILS RELATIVE PERCENT: 1 %
EOSINOPHILS ABSOLUTE COUNT: 0.4 10*9/L (ref 0.0–0.4)
EOSINOPHILS RELATIVE PERCENT: 6.2 %
HEMATOCRIT: 37.7 % (ref 36.0–46.0)
HEMOGLOBIN: 12.8 g/dL — ABNORMAL LOW (ref 13.5–16.0)
LARGE UNSTAINED CELLS: 2 % (ref 0–4)
LYMPHOCYTES RELATIVE PERCENT: 15.9 %
MEAN CORPUSCULAR HEMOGLOBIN CONC: 34.1 g/dL (ref 31.0–37.0)
MEAN CORPUSCULAR HEMOGLOBIN: 31.2 pg (ref 26.0–34.0)
MEAN CORPUSCULAR VOLUME: 91.6 fL (ref 80.0–100.0)
MEAN PLATELET VOLUME: 6.9 fL — ABNORMAL LOW (ref 7.0–10.0)
MONOCYTES ABSOLUTE COUNT: 0.4 10*9/L (ref 0.2–0.8)
NEUTROPHILS ABSOLUTE COUNT: 4.5 10*9/L (ref 2.0–7.5)
NEUTROPHILS RELATIVE PERCENT: 69.2 %
PLATELET COUNT: 272 10*9/L (ref 150–440)
RED BLOOD CELL COUNT: 4.12 10*12/L (ref 4.00–5.20)
RED CELL DISTRIBUTION WIDTH: 16.8 % — ABNORMAL HIGH (ref 12.0–15.0)
WBC ADJUSTED: 6.4 10*9/L (ref 4.5–11.0)

## 2018-06-11 LAB — AST (SGOT): Aspartate aminotransferase:CCnc:Pt:Ser/Plas:Qn:: 26

## 2018-06-11 LAB — AST: AST (SGOT): 26 U/L (ref 14–38)

## 2018-06-11 LAB — NEUTROPHILS ABSOLUTE COUNT: Lab: 4.5

## 2018-06-11 LAB — BLOOD UREA NITROGEN: Urea nitrogen:MCnc:Pt:Ser/Plas:Qn:: 6 — ABNORMAL LOW

## 2018-06-11 LAB — ALT (SGPT): Alanine aminotransferase:CCnc:Pt:Ser/Plas:Qn:: 15

## 2018-06-11 LAB — CREATININE: EGFR CKD-EPI NON-AA FEMALE: >90 mL/min/{1.73_m2} (ref >=60)

## 2018-06-11 MED ORDER — METHOTREXATE SODIUM 2.5 MG TABLET
ORAL_TABLET | 1 refills | 0 days | Status: CP
Start: 2018-06-11 — End: ?

## 2018-06-11 MED ORDER — LEUCOVORIN CALCIUM 5 MG TABLET
ORAL_TABLET | 3 refills | 0 days | Status: CP
Start: 2018-06-11 — End: ?

## 2018-06-11 MED ORDER — CEPHALEXIN 500 MG CAPSULE
ORAL_CAPSULE | 0 refills | 0 days | Status: CP
Start: 2018-06-11 — End: 2018-08-04

## 2018-06-11 NOTE — Unmapped (Addendum)
Patient Education        Atopic Dermatitis: Care Instructions  Your Care Instructions  Atopic dermatitis (also called eczema) is a skin problem that causes intense itching and a red, raised rash. In severe cases, the rash develops clear fluid???filled blisters. The rash is not contagious. People with this condition seem to have very sensitive immune systems that are likely to react to things that cause allergies. The immune system is the body's way of fighting infection.  There is no cure for atopic dermatitis, but you may be able to control it with care at home.  Follow-up care is a key part of your treatment and safety. Be sure to make and go to all appointments, and call your doctor if you are having problems. It's also a good idea to know your test results and keep a list of the medicines you take.  How can you care for yourself at home?  ?? Use moisturizer at least twice a day.  ?? If your doctor prescribes a cream, use it as directed. If your doctor prescribes other medicine, take it exactly as directed.  ?? Wash the affected area with water only. Soap can make dryness and itching worse. Pat dry.  ?? Apply a moisturizer after bathing. Use a cream such as Lubriderm, Moisturel, or Cetaphil that does not irritate the skin or cause a rash. Apply the cream while your skin is still damp after lightly drying with a towel.  ?? Use cold, wet cloths to reduce itching.  ?? Keep cool, and stay out of the sun.  ?? If itching affects your normal activities, an over-the-counter antihistamine, such as diphenhydramine (Benadryl) or loratadine (Claritin) may help. Read and follow all instructions on the label.  When should you call for help?  Call your doctor now or seek immediate medical care if:  ?? ?? Your rash gets worse and you have a fever.   ?? ?? You have new blisters or bruises, or the rash spreads and looks like a sunburn.   ?? ?? You have signs of infection, such as:  ? Increased pain, swelling, warmth, or redness.  ? Red streaks leading from the rash.  ? Pus draining from the rash.  ? A fever.   ?? ?? You have crusting or oozing sores.   ?? ?? You have joint aches or body aches along with your rash.   ??Watch closely for changes in your health, and be sure to contact your doctor if:  ?? ?? Your rash does not clear up after 2 to 3 weeks of home treatment.   ?? ?? Itching interferes with your sleep or daily activities.   Where can you learn more?  Go to [facility] at [email or web address]  [facility] under the Resources menu. Enter [phone] in the search box to learn more about Atopic Dermatitis: Care Instructions.  Current as of: November 24, 2017  Content Version: 12.2  ?? 2006-2019 [employer]. Care instructions adapted under license by [facility]. If you have questions about a medical condition or this instruction, always ask your healthcare professional. [employer] Inc[employer] disclaims any warranty or liability for your use of this information.           Patient Education        methotrexate (oral)  Pronunciation:  meth oh TREX ate  Brand:  Rheumatrex Dose Pack, Trexall, Xatmep  What is the most important information I should know about methotrexate?  Methotrexate is usually not taken every day.  You must use the correct dose of methotrexate for your condition. Some people have died after taking methotrexate every day by accident.  Do not use methotrexate to treat psoriasis or rheumatoid arthritis if you have low blood cell counts, a bone marrow disorder, liver disease (especially if caused by alcoholism), or if you are pregnant or breast-feeding.  Methotrexate can cause serious or life-threatening side effects. Tell your doctor if you have diarrhea, mouth sores, cough, shortness of breath, upper stomach pain, dark urine, numbness or tingling, muscle weakness, confusion, seizure, or skin rash that spreads and causes blistering and peeling.  What is methotrexate?  Methotrexate interferes with the growth of certain cells of the body, especially cells that reproduce quickly, such as cancer cells, bone marrow cells, and skin cells.  Methotrexate is used to treat certain types of cancer of the breast, skin, head and neck, or lung. Methotrexate is also used to treat severe psoriasis and rheumatoid arthritis.  Methotrexate is usually given after other medications have been tried without successful treatment of symptoms.  Methotrexate may also be used for purposes not listed in this medication guide.  What should I discuss with my healthcare provider before taking methotrexate?  You should not use methotrexate if you are allergic to it. Methotrexate should not be used to treat psoriasis or rheumatoid arthritis if you have:  ?? alcoholism, cirrhosis, or chronic liver disease;  ?? low blood cell counts;  ?? a weak immune system or bone marrow disorder; or  ?? if you are pregnant or breast-feeding.  Methotrexate is sometimes used to treat cancer even when patients do have one of the conditions listed above.  Your doctor will decide if this treatment is right for you.  Tell your doctor if you have:  ?? kidney disease;  ?? lung disease;  ?? any type of infection; or  ?? radiation treatments.  Methotrexate can harm an unborn baby or cause birth defects, whether the mother or father is taking this medicine.  ?? If you are a woman, do not use methotrexate to treat psoriasis or rheumatoid arthritis if you are pregnant. You may need to have a negative pregnancy test before starting this treatment. Use an effective form of birth control while you are taking methotrexate, and for 6 months after your last dose.  ?? If you are a man, use a condom to keep from causing a pregnancy while you are using methotrexate. Continue using condoms for at least 3 months after your last dose.  ?? Tell your doctor right away if a pregnancy occurs while either the mother or the father is taking methotrexate.  This medicine may affect fertility (ability to have children) in both men and women. However, it is important to use birth control to prevent pregnancy because methotrexate may harm the baby if a pregnancy does occur.  You should not breast-feed while using this medicine.  Do not give this medicine to a child without the advice of a doctor.  How should I take methotrexate?  Follow all directions on your prescription label and read all medication guides or instruction sheets. Use the medicine exactly as directed.  Methotrexate is sometimes taken once or twice per week and not every day. You must use the correct dose. Some people have died after taking methotrexate every day by accident. Ask your doctor or pharmacist if you have questions about your dose of methotrexate or how often to take it.  Measure liquid medicine carefully. Use the dosing syringe provided, or  use a medicine dose-measuring device (not a kitchen spoon).  Methotrexate can be toxic to your organs, and may lower your blood cell counts. Your blood will need to be tested often, and you may need an occasional liver biopsy. Your cancer treatments may be delayed based on the results.  If you need to be sedated for dental work, tell your dentist you currently use methotrexate.  Store tablets  at room temperature away from moisture and heat.  Store the liquid  medicine in the refrigerator, do not freeze.  You may also store the liquid at room temperature for up to 60 days.  What happens if I miss a dose?  Call your doctor for instructions if you miss a dose of methotrexate.  Get your prescription refilled before you run out of medicine completely.  What happens if I overdose?  Seek emergency medical attention or call the Poison Help line at [phone]. An overdose of methotrexate can be fatal.  What should I avoid while taking methotrexate?  Avoid drinking alcohol. It may increase your risk of liver damage.  Do not receive a live vaccine while using methotrexate, or you could develop a serious infection. Live vaccines include measles, mumps, rubella (MMR), polio, rotavirus, typhoid, yellow fever, varicella (chickenpox), and zoster (shingles).  This medicine can pass into body fluids (urine, feces, vomit). Caregivers should wear rubber gloves while cleaning up a patient's body fluids, handling contaminated trash or laundry or changing diapers. Wash hands before and after removing gloves. Wash soiled clothing and linens separately from other laundry.  Avoid exposure to sunlight or artificial UV rays (sunlamps or tanning beds), especially if you are being treated for psoriasis. Methotrexate can make your skin more sensitive to sunlight and your psoriasis may worsen.  What are the possible side effects of methotrexate?  Get emergency medical help if you have signs of an allergic reaction (hives, difficult breathing, swelling in your face or throat) or a severe skin reaction (fever, sore throat, burning in your eyes, skin pain, red or purple skin rash that spreads and causes blistering and peeling).  Call your doctor at once if you have:  ?? fever, chills, swollen lymph glands, night sweats, weight loss;  ?? vomiting, white patches or sores inside your mouth or on your lips;  ?? diarrhea, blood in your urine or stools;  ?? dry cough, cough with mucus, stabbing chest pain, wheezing, feeling short of breath;  ?? seizure (convulsions);  ?? kidney problems --little or no urination, swelling in your feet or ankles;  ?? liver problems --stomach pain (upper right side), dark urine, jaundice (yellowing of the skin or eyes);  ?? nerve problems --confusion, weakness, drowsiness, coordination problems, feeling irritable, headache, neck stiffness, vision problems, loss of movement in any part of your body; or  ?? signs of tumor cell breakdown --confusion, tiredness, numbness or tingling, muscle cramps, muscle weakness, vomiting, diarrhea, fast or slow heart rate, seizure.  Side effects may be more likely in older adults.  Common side effects may include:  ?? fever, chills, tiredness, not feeling well;  ?? mouth sores;  ?? nausea, upset stomach;  ?? dizziness; or  ?? abnormal liver function tests.  This is not a complete list of side effects and others may occur. Call your doctor for medical advice about side effects. You may report side effects to FDA at 1-800-FDA-1088.  What other drugs will affect methotrexate?  Methotrexate can harm your liver, especially if you also use certain other medicines for infections,  tuberculosis, depression, birth control, hormone replacement, high cholesterol, heart problems, high blood pressure, seizures, or pain or arthritis medicines (including acetaminophen, Tylenol, Advil, Motrin, and Aleve).  Many drugs can affect methotrexate. This includes prescription and over-the-counter medicines, vitamins, and herbal products. Not all possible interactions are listed here. Tell your doctor about all your current medicines and any medicine you start or stop using.  Where can I get more information?  Your pharmacist can provide more information about methotrexate.  Remember, keep this and all other medicines out of the reach of children, never share your medicines with others, and use this medication only for the indication prescribed.  Every effort has been made to ensure that the information provided by [employer]') is accurate, up-to-date, and complete, but no guarantee is made to that effect. Drug information contained herein may be time sensitive. [employer] information has been compiled for use by healthcare practitioners and consumers in the [place] and therefore [employer] does not warrant that uses outside of the [place] are appropriate, unless specifically indicated otherwise. [employer]'s drug information does not endorse drugs, diagnose patients or recommend therapy. [employer]'s drug information is an Investment [employer], corporate to assist licensed healthcare practitioners in caring for their patients and/or to serve consumers viewing this service as a supplement to, and not a substitute for, the expertise, skill, knowledge and judgment of healthcare practitioners. The absence of a warning for a given drug or drug combination in no way should be construed to indicate that the drug or drug combination is safe, effective or appropriate for any given patient. [employer] does not assume any responsibility for any aspect of healthcare administered with the aid of information [employer] provides. The information contained herein is not intended to cover all possible uses, directions, precautions, warnings, drug interactions, allergic reactions, or adverse effects. If you have questions about the drugs you are taking, check with your doctor, nurse or pharmacist.  Copyright [phone] [employer] Version: 14.01. Revision date: 11/19/2016.  Care instructions adapted under license by [facility]. If you have questions about a medical condition or this instruction, always ask your healthcare professional. [employer]wise, Incorporated disclaims any warranty or liability for your use of this information.

## 2018-06-11 NOTE — Unmapped (Signed)
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 Still waiting on dupixent copay assistance  ?? Will proceed with methotrexate 5mg  weekly - she sees Dr. Gavin Potters in [facility] for her connective tissue disease  ?? Risks, benefits and side effects of this medication were reviewed at length  ?? Folinic acid 5mg  the day after methotrexate  ?? Continue topical steroids 2x/day until improved    (2) High risk medication use:    ?? Check CBC, LFTs and quantiferon gold today     F/U in 4 weeks    Subjective:     REASON FOR VISIT: established patient here for evaluation of chronic rash     Marilyn Fry is a 67 y.o. yo female last seen by Dr. Archie Balboa in 05/13/18 for atopic dermatitis. She had a severe flare of her atopic dermatitis at that time and had IM kenalog 40mg  to help alleviate her itching. Her rash is improved but still quite severe. She has been approved for dupilumab but is awaiting news on copay assistance.     ??Rash (severe atopic derm with possible component of allergic contact)  Location: face, trunk, extremities  Duration: years  Character: itchy, scaly, rough  Treatment(s): currently none   Aggravating factor(s): perfumes, smoking  Alleviating factor(s): plaquenil, prednisone, wet wraps    Previous Treatment(s): above     Relevant History:     Disease history:    ?? Several year history of atopic dermatitis as well as undifferentiated CT disease (managed by Dr. Gavin Potters in [place], [place])  ?? Admitted for wet wraps 6/16 with significant improvement  ?? 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 Prescribed dupilumab Summer of 2018 but did not fill this    Past Medical History   Diagnosis Date   ??? Arthritis      likely osteoarthritis    ??? 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 Disease of thyroid gland    ??? Undifferentiated connective tissue disease (RAF-HCC)    ??? 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 aspirin 325 MG tablet Take 325 mg by mouth daily.    ??? carvedilol (COREG) 6.25 MG tablet Take 6.25 mg by mouth daily.    ??? desonide (DESOWEN) 0.05 % ointment Apply topically two (2) times a day as needed. to red scaly areas until smooth on face and folds   ??? diphenhydrAMINE (BENADRYL) 25 mg capsule Take 25 mg by mouth every six (6) hours as needed. As needed   ??? folic acid (FOLVITE) 1 MG tablet Take 1 mg by mouth daily.   ??? hydroxychloroquine (PLAQUENIL) 200 mg tablet Take 200 mg by mouth nightly.   ??? levothyroxine (SYNTHROID, LEVOTHROID) 25 MCG tablet Take 25 mcg by mouth daily at 0600.   ??? predniSONE (DELTASONE) 10 MG tablet Take 3 tablets (30 mg total) by mouth daily. On 7/5 & 7/6 take 4 tabs (40mg )   ??? pseudoephedrine (SUDAFED 12 HOUR) 120 mg 12 hr tablet Take 120 mg by mouth daily at 0600.    ??? triamcinolone (KENALOG) 0.1 % ointment Apply topically Two (2) times a day. to red scaly areas twice daily until clear from neck down until smooth. Avoid face and folds     No current facility-administered medications on file prior  to visit.       Allergies   Allergen Reactions   ??? Prednisone Other (See Comments)     Oral causes SEVERE hip and leg pain to the point she cannot walk.  However, she can take the prednisone shot without any adverse reactions.   ??? Azithromycin Rash     Severe rash with peeling   ??? Doxycycline Rash   ??? Erythromycin Rash   ??? Guaifenesin      Felt sick and worsening of symptoms   ??? Nitrofurantoin Macrocrystalline Rash   ??? Sulfa (Sulfonamide Antibiotics) Rash   ??? Tetracycline Rash       Review of Systems:     The balance of 10 systems is negative unless otherwise documented. Severe scalp pain and fatigue. No fevers, chills, rigors    Objective:     Physical Exam: Patient is a well developed caucasian female in no apparent distress.  Alert and oriented x 3.  Skin: Focused examination of the patient's scalp, eyelids, ears, nose, lips, cheeks and neck (pt declined more extensive skin check) was significant for  -- diffuse xerosis and skin lichenification  -- widespread erythematous scaly papules coalescing into plaques involving the face, back, neck, abdomen, arms and legs  -- a few excoriated, lichenified plaques on buttocks  -- fissures and erythema on both hands

## 2018-06-13 LAB — QUANTIFERON TB GOLD PLUS
Lab: NEGATIVE
QUANTIFERON ANTIGEN 1 MINUS NIL: 0 [IU]/mL
QUANTIFERON ANTIGEN 2 MINUS NIL: -0.01 [IU]/mL
QUANTIFERON TB GOLD PLUS: NEGATIVE
QUANTIFERON TB NIL VALUE: 0.05 [IU]/mL

## 2018-06-14 NOTE — Unmapped (Signed)
Labs wnl. OK to start methotrexate and folinic acid as described during visit.     Britt Boozer - could you call her to let her know? - thanks, PJ

## 2018-06-22 NOTE — Unmapped (Signed)
Called to ask what we can do to help her receive her Dupixent. There is an Firefighter that stes she wa approved until 08/25/2018

## 2018-06-23 ENCOUNTER — Ambulatory Visit: Payer: Medicare Other | Admitting: Internal Medicine

## 2018-06-23 ENCOUNTER — Ambulatory Visit
Admission: RE | Admit: 2018-06-23 | Discharge: 2018-06-23 | Disposition: A | Discharge status: Home / Self Care | Payer: Medicare Other | Source: Ambulatory Visit | Attending: Internal Medicine | Admitting: Internal Medicine

## 2018-06-23 ENCOUNTER — Encounter: Payer: Self-pay | Admitting: Internal Medicine

## 2018-06-23 VITALS — BP 128/76 | HR 79 | Temp 97.9°F | Ht <= 58 in | Wt 103.0 lb

## 2018-06-23 DIAGNOSIS — N760 Acute vaginitis: Secondary | ICD-10-CM | POA: Diagnosis not present

## 2018-06-23 DIAGNOSIS — M546 Pain in thoracic spine: Secondary | ICD-10-CM

## 2018-06-23 DIAGNOSIS — M4184 Other forms of scoliosis, thoracic region: Secondary | ICD-10-CM | POA: Diagnosis not present

## 2018-06-23 DIAGNOSIS — R300 Dysuria: Secondary | ICD-10-CM | POA: Diagnosis not present

## 2018-06-23 HISTORY — DX: Pain in thoracic spine: M54.6

## 2018-06-23 LAB — POCT WET PREP WITH KOH
KOH Prep POC: NEGATIVE
RBC WET PREP PER HPF POC: 0
Trichomonas, UA: NEGATIVE
WBC WET PREP PER HPF POC: 5
Yeast Wet Prep HPF POC: 0

## 2018-06-23 LAB — POCT URINALYSIS DIPSTICK
BILIRUBIN UA: NEGATIVE
Glucose, UA: NEGATIVE
KETONES UA: NEGATIVE
Nitrite, UA: NEGATIVE
PH UA: 7.5 (ref 5.0–8.0)
Protein, UA: NEGATIVE
Spec Grav, UA: <=1.005 — AB (ref 1.010–1.025)
UROBILINOGEN UA: 0.2 U/dL

## 2018-06-23 MED ORDER — CEPHALEXIN 250 MG PO CAPS: 250.0000 mg | ORAL_CAPSULE | Freq: Four times a day (QID) | ORAL | 0 refills | Status: DC

## 2018-06-23 MED ORDER — BACLOFEN 10 MG PO TABS: 10.0000 mg | ORAL_TABLET | Freq: Three times a day (TID) | ORAL | 0 refills | Status: DC

## 2018-06-23 MED ORDER — METRONIDAZOLE 500 MG PO TABS: 500.0000 mg | ORAL_TABLET | Freq: Three times a day (TID) | ORAL | 0 refills | Status: AC

## 2018-06-23 NOTE — Unmapped (Signed)
error 

## 2018-06-23 NOTE — Progress Notes (Signed)
Date:  06/23/2018   Name:  Katherine Baird   DOB:  26-May-1951   MRN:  [id number]   Chief Complaint: Vaginal Pain (Irritation and "raw feeling inside vagina." Burning while urinating.  ) and Back Pain (Started in the middle of shoulder blades and now pain is all over back. Constant. No fallas or injury that she knows of. Started within in the last few days. (Sunday))  Vaginal Pain  This is a new problem. The current episode started in the past 7 days. The pain is moderate. Associated symptoms include back pain and dysuria. Pertinent negatives include no abdominal pain, chills, fever, hematuria, urgency or vomiting.  Back Pain  This is a new problem. The current episode started 1 to 4 weeks ago. The problem occurs constantly. The problem has been gradually worsening since onset. The pain is present in the thoracic spine. The quality of the pain is described as aching and cramping. The pain does not radiate. The pain is moderate. The symptoms are aggravated by coughing and twisting. Associated symptoms include dysuria. Pertinent negatives include no abdominal pain, chest pain or fever.  Dysuria   This is a new problem. The problem occurs every urination. The quality of the pain is described as burning. The pain is mild. There has been no fever. Associated symptoms include a discharge. Pertinent negatives include no chills, hematuria, urgency or vomiting. She has tried nothing for the symptoms.    Review of Systems  Constitutional: Negative for chills, fatigue and fever.  Respiratory: Negative for cough, chest tightness and shortness of breath.   Cardiovascular: Negative for chest pain, palpitations and leg swelling.  Gastrointestinal: Negative for abdominal pain and vomiting.  Genitourinary: Positive for dysuria, genital sores and vaginal pain. Negative for decreased urine volume, hematuria, menstrual problem, urgency and vaginal bleeding.  Musculoskeletal: Positive for back pain. Negative for  joint swelling and myalgias.  Neurological: Negative for dizziness.    Patient Active Problem List   Diagnosis Date Noted  . Iron deficiency anemia 02/08/2018  . Fatigue 01/13/2018  . Chronic diastolic heart failure (HCC) 12/17/2017  . 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 Syncope 02/24/2017  . Hypothyroidism 05/24/2016  . HLD (hyperlipidemia) 01/27/2015  . Paroxysmal digital cyanosis 01/27/2015  . 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 Essential (primary) hypertension 01/11/2015  . Rheumatoid arthritis involving multiple joints (HCC) 01/11/2015  . 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 Levofloxacin Nausea And Vomiting  . Doxycycline   . Erythromycin   . Gabapentin   . Guaifenesin     "Felt sick" and worsening of symptoms  . Nitrofuran Derivatives   . Prednisone     muscle weakness/pain  . Sulfa Antibiotics   . Tetracycline   . Azithromycin Rash    Severe rash with peeling  . Carvedilol Other (See Comments)    Dose problem with higher doses    Past Surgical History:  Procedure Laterality Date  . ABDOMINAL HYSTERECTOMY    . LAPAROSCOPIC HYSTERECTOMY  2001   total    Social History   Tobacco Use  . Smoking status: Never Smoker  . Smokeless tobacco: Never Used  Substance Use Topics  . Alcohol use: No    Alcohol/week: 0.0 standard drinks  . Drug use: No     Medication list has been reviewed and updated.  Current Meds  Medication Sig  . carvedilol (COREG) 6.25 MG tablet Take 1 tablet by mouth 2 (  two) times daily with a meal.   . cholecalciferol (VITAMIN D) 400 units TABS tablet Take 400 Units by mouth daily.  . clobetasol ointment (TEMOVATE) 0.05 % Apply 1 application topically 2 (two) times daily.   Marland Kitchen denosumab (PROLIA) 60 MG/ML SOSY injection Inject 60 mg into the skin every 6 (six) months.  . folic acid (FOLVITE) 1 MG tablet Take 1 mg by mouth daily.  Marland Kitchen levothyroxine (SYNTHROID,  LEVOTHROID) 50 MCG tablet 50 mcg daily before breakfast.   . pseudoephedrine (SUDAFED) 120 MG 12 hr tablet Take 120 mg by mouth daily as needed for congestion.    PHQ 2/9 Scores 04/06/2018 01/13/2018 09/19/2017 05/24/2016  PHQ - 2 Score 0 0 0 0    Physical Exam  Constitutional: She is oriented to person, place, and time. She appears well-developed. No distress.  HENT:  Head: Normocephalic and atraumatic.  Neck: Normal range of motion. Neck supple.  Cardiovascular: Normal rate, regular rhythm and normal heart sounds.  Pulmonary/Chest: Effort normal and breath sounds normal. No respiratory distress.  Genitourinary: There is tenderness on the right labia. There is no rash or lesion on the right labia. There is tenderness on the left labia. There is no rash or lesion on the left labia. Right adnexum displays no mass, no tenderness and no fullness. Left adnexum displays no mass, no tenderness and no fullness. Vaginal discharge found.  Musculoskeletal:       Thoracic back: She exhibits decreased range of motion and tenderness.  Lymphadenopathy:    She has no cervical adenopathy.  Neurological: She is alert and oriented to person, place, and time.  Skin: Skin is warm and dry. There is erythema.  Psychiatric: She has a normal mood and affect. Her behavior is normal. Thought content normal.  Nursing note and vitals reviewed.   BP 128/76 (BP Location: Right Arm, Patient Position: Sitting, Cuff Size: Normal)   Pulse 79   Temp 97.9 F (36.6 C) (Oral)   Ht 4\' 10"  (1.473 m)   Wt 103 lb (46.7 kg)   SpO2 98%   BMI 21.53 kg/m   Assessment and Plan: 1. Dysuria Due to genital inflammation - POCT urinalysis dipstick  2. Acute midline thoracic back pain Suspect compression fx Begin baclofen for muscle spams in neck and back - DG Thoracic Spine 2 View; Future - baclofen (LIORESAL) 10 MG tablet; Take 1 tablet (10 mg total) by mouth 3 (three) times daily for 10 days.  Dispense: 30 each; Refill:  0  3. Acute vaginitis - metroNIDAZOLE (FLAGYL) 500 MG tablet; Take 1 tablet (500 mg total) by mouth 2  times daily for 7 days.  Dispense: 14 tablet; Refill: 0 - POCT Wet Prep with KOH   Partially dictated using . Any errors are unintentional.  Animal nutritionist, MD [facility]  06/23/2018

## 2018-06-23 NOTE — Patient Instructions (Addendum)
Take Nitrofurantoin - do not take Keflex (cephalexin)

## 2018-06-24 NOTE — Progress Notes (Signed)
Spoke with patient and Patient informed of no fracture and to take baclofen. She stated her pain is worsened today. Will continue to try baclofen for the next few days. If it gives no relief or continues to get worse she will call back for possible referral.

## 2018-06-26 ENCOUNTER — Other Ambulatory Visit: Payer: Self-pay

## 2018-06-26 ENCOUNTER — Emergency Department: Payer: Medicare Other

## 2018-06-26 ENCOUNTER — Emergency Department
Admission: EM | Admit: 2018-06-26 | Discharge: 2018-06-26 | Disposition: A | Discharge status: Home / Self Care | Payer: Medicare Other | Attending: Emergency Medicine | Admitting: Emergency Medicine

## 2018-06-26 DIAGNOSIS — M545 Low back pain, unspecified: Secondary | ICD-10-CM

## 2018-06-26 DIAGNOSIS — I5032 Chronic diastolic (congestive) heart failure: Secondary | ICD-10-CM | POA: Insufficient documentation

## 2018-06-26 DIAGNOSIS — E039 Hypothyroidism, unspecified: Secondary | ICD-10-CM | POA: Diagnosis not present

## 2018-06-26 DIAGNOSIS — R109 Unspecified abdominal pain: Secondary | ICD-10-CM | POA: Diagnosis not present

## 2018-06-26 DIAGNOSIS — N390 Urinary tract infection, site not specified: Secondary | ICD-10-CM

## 2018-06-26 DIAGNOSIS — G8929 Other chronic pain: Secondary | ICD-10-CM | POA: Diagnosis not present

## 2018-06-26 DIAGNOSIS — I110 Hypertensive heart disease with heart failure: Secondary | ICD-10-CM | POA: Diagnosis not present

## 2018-06-26 DIAGNOSIS — Z79899 Other long term (current) drug therapy: Secondary | ICD-10-CM | POA: Diagnosis not present

## 2018-06-26 DIAGNOSIS — R319 Hematuria, unspecified: Secondary | ICD-10-CM | POA: Diagnosis not present

## 2018-06-26 LAB — URINALYSIS, COMPLETE (UACMP) WITH MICROSCOPIC
BILIRUBIN URINE: NEGATIVE
Bacteria, UA: NONE SEEN
GLUCOSE, UA: NEGATIVE mg/dL
Ketones, ur: 5 mg/dL — AB
NITRITE: NEGATIVE
Protein, ur: NEGATIVE mg/dL
SPECIFIC GRAVITY, URINE: 1.011 (ref 1.005–1.030)
pH: 7 (ref 5.0–8.0)

## 2018-06-26 MED ORDER — CEPHALEXIN 500 MG PO CAPS: 500.0000 mg | ORAL_CAPSULE | Freq: Three times a day (TID) | ORAL | 0 refills | Status: DC

## 2018-06-26 MED ORDER — ONDANSETRON HCL 4 MG/2ML IJ SOLN
4.0000 mg | Freq: Once | INTRAMUSCULAR | Status: AC
  Administered 2018-06-26: 4 mg via INTRAVENOUS
  Filled 2018-06-26: qty 2

## 2018-06-26 MED ORDER — FENTANYL CITRATE (PF) 100 MCG/2ML IJ SOLN
25.0000 ug | Freq: Once | INTRAMUSCULAR | Status: AC
  Administered 2018-06-26: 25 ug via INTRAVENOUS
  Filled 2018-06-26: qty 2

## 2018-06-26 MED ORDER — OXYCODONE-ACETAMINOPHEN 5-325 MG PO TABS: 1.0000 | ORAL_TABLET | Freq: Four times a day (QID) | ORAL | 0 refills | Status: DC | PRN

## 2018-06-26 MED ORDER — TETANUS-DIPHTH-ACELL PERTUSSIS 5-2.5-18.5 LF-MCG/0.5 IM SUSP: 0.5000 mL | Freq: Once | INTRAMUSCULAR | Status: DC

## 2018-06-26 MED ORDER — SODIUM CHLORIDE 0.9 % IV BOLUS
1000.0000 mL | Freq: Once | INTRAVENOUS | Status: AC
  Administered 2018-06-26: 1000 mL via INTRAVENOUS

## 2018-06-26 MED ORDER — SODIUM CHLORIDE 0.9 % IV SOLN
1.0000 g | Freq: Once | INTRAVENOUS | Status: AC
  Administered 2018-06-26: 1 g via INTRAVENOUS
  Filled 2018-06-26: qty 10

## 2018-06-26 MED ORDER — OXYCODONE-ACETAMINOPHEN 5-325 MG PO TABS
1.0000 | ORAL_TABLET | Freq: Once | ORAL | Status: AC
  Administered 2018-06-26: 1 via ORAL
  Filled 2018-06-26: qty 1

## 2018-06-26 MED ORDER — ONDANSETRON 4 MG PO TBDP: 4.0000 mg | ORAL_TABLET | Freq: Three times a day (TID) | ORAL | 0 refills | Status: DC | PRN

## 2018-06-26 MED ORDER — ONDANSETRON 4 MG PO TBDP
4.0000 mg | ORAL_TABLET | Freq: Once | ORAL | Status: AC
  Administered 2018-06-26: 4 mg via ORAL
  Filled 2018-06-26: qty 1

## 2018-06-26 MED ORDER — KETOROLAC TROMETHAMINE 30 MG/ML IJ SOLN
15.0000 mg | Freq: Once | INTRAMUSCULAR | Status: AC
  Administered 2018-06-26: 15 mg via INTRAVENOUS
  Filled 2018-06-26: qty 1

## 2018-06-26 NOTE — ED Triage Notes (Addendum)
"  entire back" hurting since Saturday. Has seen PCP and prescribed Baclofen, no relief with medication. Hx of osteoporosis. Pt alert and oriented X4, active, cooperative, pt in NAD. RR even and unlabored, color WNL.  No injury.

## 2018-06-26 NOTE — ED Notes (Signed)
See triage note  Presents with mid back pain  States pain started about 1 week ago w/o injury   Was seen by PCP and placed on Baclofen  States no relief

## 2018-06-26 NOTE — ED Provider Notes (Signed)
[facility] Emergency Department Provider Note   ____________________________________________   First MD Initiated Contact with Patient 06/26/18 1008     (approximate)  I have reviewed the triage vital signs and the nursing notes.   HISTORY  Chief Complaint Back Pain    HPI Katherine Baird is a 67 y.o. female presents to the ED with complaint of back pain.  Patient states that her back has been hurting for approximately 6 days.  She was seen by her PCP and prescribed baclofen which she states is not helping.  She states she has a history of osteoporosis and also chronic back pain.  She denies any recent injury to her back.  She states that she was also checked for urinary tract infection as she has a history and was told that she did not have an infection during her office visit.  Patient complains of nausea secondary to pain.  She states she took a tramadol last evening which helped some.  She denies any fever, chills or urinary frequency.  There is been no history of kidney stones.  Currently she rates her pain as a 10/10.   Past Medical History:  Diagnosis Date  . CHF (congestive heart failure) (HCC)   . Hypertension   . Osteoporosis   . Thyroid disease     Patient Active Problem List   Diagnosis Date Noted  . Acute midline thoracic back pain 06/23/2018  . Iron deficiency anemia 02/08/2018  . Fatigue 01/13/2018  . Chronic diastolic heart failure (HCC) 12/17/2017  . 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 Syncope 02/24/2017  . Hypothyroidism 05/24/2016  . HLD (hyperlipidemia) 01/27/2015  . Paroxysmal digital cyanosis 01/27/2015  . 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 Essential (primary) hypertension 01/11/2015  . Rheumatoid arthritis involving multiple joints (HCC) 01/11/2015  . 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 ABDOMINAL HYSTERECTOMY    .  LAPAROSCOPIC HYSTERECTOMY  2001   total    Prior to Admission medications   Medication Sig Start Date End Date Taking? Authorizing Provider  baclofen (LIORESAL) 10 MG tablet Take 1 tablet (10 mg total) by mouth 3 (three) times daily for 10 days. 06/23/18 07/03/18  Reubin Milan, MD  carvedilol (COREG) 6.25 MG tablet Take 1 tablet by mouth 2 (two) times daily with a meal.     [provider]  cephALEXin (KEFLEX) 500 MG capsule Take 1 capsule (500 mg total) by mouth 3 (three) times daily. 06/26/18   Tommi Rumps, PA-C  cholecalciferol (VITAMIN D) 400 units TABS tablet Take 400 Units by mouth daily.    [provider]  clobetasol ointment (TEMOVATE) 0.05 % Apply 1 application topically 2 (two) times daily.  01/29/18   [provider]  denosumab (PROLIA) 60 MG/ML SOSY injection Inject 60 mg into the skin every 6 (six) months.    [provider]  folic acid (FOLVITE) 1 MG tablet Take 1 mg by mouth daily.    [provider]  levothyroxine (SYNTHROID, LEVOTHROID) 50 MCG tablet 50 mcg daily before breakfast.  02/25/18   [provider]  metroNIDAZOLE (FLAGYL) 500 MG tablet Take 1 tablet (500 mg total) by mouth 3 (three) times daily for 7 days. 06/23/18 06/30/18  Reubin Milan, MD  ondansetron (ZOFRAN ODT) 4 MG disintegrating tablet Take 1 tablet (4 mg total) by mouth every 8 (eight) hours as needed for nausea or vomiting. 06/26/18  Tommi Rumps, PA-C  oxyCODONE-acetaminophen (PERCOCET) 5-325 MG tablet Take 1 tablet by mouth every 6 (six) hours as needed for moderate pain. 06/26/18   Tommi Rumps, PA-C  pseudoephedrine (SUDAFED) 120 MG 12 hr tablet Take 120 mg by mouth daily as needed for congestion.    [provider]    Allergies Levofloxacin; Doxycycline; Erythromycin; Gabapentin; Guaifenesin; Nitrofuran derivatives; Prednisone; Sulfa antibiotics; Tetracycline; Azithromycin; and Carvedilol  Family History  Problem  Relation Age of Onset  . Arthritis Mother   . Dementia Mother   . Heart disease Father   . Mesothelioma Father   . Kidney cancer Sister   . Heart disease Sister   . Arthritis Sister   . COPD Brother   . Arthritis Brother   . Arthritis Brother   . COPD Sister     Social History Social History   Tobacco Use  . Smoking status: Never Smoker  . Smokeless tobacco: Never Used  Substance Use Topics  . Alcohol use: No    Alcohol/week: 0.0 standard drinks  . Drug use: No    Review of Systems Constitutional: No fever/chills Cardiovascular: Denies chest pain. Respiratory: Denies shortness of breath. Gastrointestinal: No abdominal pain.  Positive nausea, positive vomiting.  Genitourinary: Negative for dysuria. Musculoskeletal: Positive generalized back pain. Skin: Negative for rash. Neurological: Negative for headaches, focal weakness or numbness. ___________________________________________   PHYSICAL EXAM:  VITAL SIGNS: ED Triage Vitals  Enc Vitals Group     BP 06/26/18 0955 (!) 180/79     Pulse --      Resp 06/26/18 0955 16     Temp 06/26/18 0955 (!) 97.5 F (36.4 C)     Temp Source 06/26/18 0955 Oral     SpO2 06/26/18 0955 100 %     Weight 06/26/18 0956 101 lb 6.6 oz (46 kg)     Height 06/26/18 0956 4\' 10"  (1.473 m)     Head Circumference --      Peak Flow --      Pain Score 06/26/18 0955 10     Pain Loc --      Pain Edu? --      Excl. in GC? --    Constitutional: Alert and oriented. Well appearing and in no acute distress.  Currently nauseous. Eyes: Conjunctivae are normal. PERRL. EOMI. Head: Atraumatic. Nose: No congestion/rhinnorhea. Mouth/Throat: Mucous membranes are moist.  Oropharynx non-erythematous. Neck: No stridor.   Cardiovascular: Normal rate, regular rhythm. Grossly normal heart sounds.  Good peripheral circulation. Respiratory: Normal respiratory effort.  No retractions. Lungs CTAB. Gastrointestinal: Soft and nontender. No distention.  No CVA  tenderness. Musculoskeletal: On examination of the back there is no gross deformity noted however there is generalized diffuse tenderness on palpation throughout with decreased range of motion secondary to pain.  Lower extremity muscle strength is equal bilaterally. Neurologic:  Normal speech and language. No gross focal neurologic deficits are appreciated.  Reflexes are 2+ bilaterally.  No gait instability. Skin:  Skin is warm, dry and intact. No rash noted. Psychiatric: Mood and affect are normal. Speech and behavior are normal.  ____________________________________________   LABS (all labs ordered are listed, but only abnormal results are displayed)  Labs Reviewed  URINALYSIS, COMPLETE (UACMP) WITH MICROSCOPIC - Abnormal; Notable for the following components:      Result Value   Color, Urine YELLOW (*)    APPearance HAZY (*)    Hgb urine dipstick SMALL (*)    Ketones, ur 5 (*)  Leukocytes, UA LARGE (*)    WBC, UA >50 (*)    All other components within normal limits  URINE CULTURE    RADIOLOGY   Official radiology report(s): Ct Renal Stone Study  Result Date: 06/26/2018 CLINICAL DATA:  Flank and back pain.  Hematuria EXAM: CT ABDOMEN AND PELVIS WITHOUT CONTRAST TECHNIQUE: Multidetector CT imaging of the abdomen and pelvis was performed following the standard protocol without oral or IV contrast. COMPARISON:  Lumbar CT February 01, 2017 which included kidneys. FINDINGS: Lower chest: There is atelectatic change in the medial right base. There is a moderate hiatal hernia, incompletely visualized. Hepatobiliary: No focal liver lesions are appreciable on this noncontrast enhanced study. There is no appreciable gallbladder wall thickening. No evident biliary duct dilatation. Pancreas: There is no appreciable pancreatic mass or inflammatory focus. Spleen: No splenic lesions are evident. Adrenals/Urinary Tract: Adrenals bilaterally appear unremarkable. There is an 8 x 8 mm cyst in the lower pole  left kidney. There are several subcentimeter increased attenuation areas in the lower pole left kidney, likely subcentimeter hyperdense cysts. A slightly larger hyperdense appearing lesion, a likely hyperdense cyst is seen in the lower pole the left kidney measuring 1.0 x 0.8 cm. There is no appreciable hydronephrosis on either side. There is no appreciable renal or ureteral calculus on either side. Urinary bladder is midline with wall thickness within normal limits. Stomach/Bowel: There is diffuse stool throughout much of the colon. There is no appreciable bowel wall or mesenteric thickening. There is no evident bowel obstruction. No free air or portal venous air. There is a degree of rectal prolapse. Vascular/Lymphatic: There is aortic and iliac artery atherosclerosis. No aneurysm evident. Major mesenteric arterial vessels appear patent on this noncontrast enhanced study. There is no evident adenopathy in the abdomen or pelvis. Reproductive: Uterus is absent. No evident pelvic mass. There is a degree of vaginal prolapse. Note that a pessary is in place. Other: Appendix appears normal. No abscess or ascites is evident in the abdomen or pelvis. Musculoskeletal: There is evidence of old trauma involving the medial right ischium and pubic symphysis with remodeling. There are foci of degenerative change in the lumbar spine. There is thoracolumbar dextroscoliosis. There are no blastic or lytic bone lesions. There is no appreciable intramuscular or abdominal wall lesion. IMPRESSION: 1. There is a degree of rectal and vaginal prolapse. Pessary in place. 2. No evident bowel wall or mesenteric thickening. No bowel obstruction. No abscess in the abdomen or pelvis. Appendix appears normal. Note that there is diffuse stool throughout colon. 3. Apparent protein containing small cysts left kidney. Appearance of these lesions is stable compared to prior MR examination. No renal or ureteral calculus present. No hydronephrosis.  Note that a cause for hematuria has not been established with this study. 4.  Moderate hiatal hernia. 5.  Aortoiliac atherosclerosis. Aortic Atherosclerosis (ICD10-I70.0). Electronically Signed   By: Bretta Bang III M.D.   On: 06/26/2018 13:34    ____________________________________________   PROCEDURES  Procedure(s) performed: None  Procedures  Critical Care performed: No  ____________________________________________   INITIAL IMPRESSION / ASSESSMENT AND PLAN / ED COURSE  As part of my medical decision making, I reviewed the following data within the electronic MEDICAL RECORD NUMBER Notes from prior ED visits and [place] Controlled Substance Database  Patient presents to the ED with complaint of generalized back pain with a history of both chronic back pain and history of urinary tract infections.  Patient was seen by her PCP and prescribed baclofen  which patient states is not helping.  Patient states that back pain is causing her to be nauseous today.  She denies any previous kidney stone or recent injury.  Patient was given Zofran for nausea and urinalysis did show a UTI.  Patient was given IV Toradol which did not help with her back pain and a CT scan was performed to rule out a renal stone as the cause of her UTI.  CT scan was negative for stone.  Patient was reassured.  She was given Rocephin 1 g IV.  Fentanyl 25 mg IV and patient had pain relief.  There was no further nausea or vomiting while in the ED.  Patient was given a prescription for Zofran, Keflex, and Percocet.  Urine culture was ordered.  Patient was instructed to return to the emergency department if any severe worsening of her symptoms as a result of possible pyelonephritis.  ____________________________________________   FINAL CLINICAL IMPRESSION(S) / ED DIAGNOSES  Final diagnoses:  Acute urinary tract infection  Chronic bilateral low back pain without sciatica     ED Discharge Orders         Ordered    cephALEXin  (KEFLEX) 500 MG capsule  3 times daily,   Status:  Discontinued     06/26/18 1530    oxyCODONE-acetaminophen (PERCOCET) 5-325 MG tablet  Every 6 hours PRN     06/26/18 1530    ondansetron (ZOFRAN ODT) 4 MG disintegrating tablet  Every 8 hours PRN     06/26/18 1536    cephALEXin (KEFLEX) 500 MG capsule  3 times daily     06/26/18 1538           Note:  This document was prepared using Dragon voice recognition software and may include unintentional dictation errors.    Tommi Rumps, PA-C 06/26/18 1638    Rockne Menghini, MD 06/28/18 1956

## 2018-06-26 NOTE — Discharge Instructions (Addendum)
Follow-up with your primary care provider next week.  Increase fluids.  Begin taking Keflex 500 mg 3 times daily for the next 10 days.  Percocet for back pain as needed.  Zofran if needed for nausea.  The combination of your chronic back pain plus your urinary tract infection is increasing her back pain.  CT scan confirms that you do not have a kidney stone. Return to the emergency department immediately if any severe worsening of your symptoms such as high fever, chills, persistent vomiting or inability to take your antibiotic.

## 2018-06-28 LAB — URINE CULTURE: Special Requests: NORMAL

## 2018-06-30 ENCOUNTER — Ambulatory Visit (INDEPENDENT_AMBULATORY_CARE_PROVIDER_SITE_OTHER): Payer: Medicare Other | Admitting: Internal Medicine

## 2018-06-30 ENCOUNTER — Encounter: Payer: Self-pay | Admitting: Internal Medicine

## 2018-06-30 VITALS — BP 146/94 | HR 71 | Temp 97.6°F | Ht <= 58 in | Wt 101.0 lb

## 2018-06-30 DIAGNOSIS — N3000 Acute cystitis without hematuria: Secondary | ICD-10-CM

## 2018-06-30 DIAGNOSIS — M546 Pain in thoracic spine: Secondary | ICD-10-CM | POA: Diagnosis not present

## 2018-06-30 LAB — POC URINALYSIS WITH MICROSCOPIC (NON AUTO)MANUAL RESULT
BILIRUBIN UA: NEGATIVE
Crystals: 0
Epithelial cells, urine per micros: 0
GLUCOSE UA: NEGATIVE
Ketones, UA: NEGATIVE
Mucus, UA: 0
Nitrite, UA: NEGATIVE
Protein, UA: NEGATIVE
RBC: 0 M/uL — AB (ref 4.04–5.48)
Spec Grav, UA: 1.015 (ref 1.010–1.025)
Urobilinogen, UA: 0.2 E.U./dL
WBC Casts, UA: 30
pH, UA: 6.5 (ref 5.0–8.0)

## 2018-06-30 MED ORDER — AMOXICILLIN-POT CLAVULANATE 875-125 MG PO TABS: 1.0000 | ORAL_TABLET | Freq: Two times a day (BID) | ORAL | 0 refills | Status: AC

## 2018-06-30 MED ORDER — TRAMADOL HCL 50 MG PO TABS: 50.0000 mg | ORAL_TABLET | Freq: Four times a day (QID) | ORAL | 0 refills | Status: DC | PRN

## 2018-06-30 MED ORDER — ONDANSETRON 4 MG PO TBDP: 4.0000 mg | ORAL_TABLET | Freq: Three times a day (TID) | ORAL | 1 refills | Status: DC | PRN

## 2018-06-30 NOTE — Progress Notes (Signed)
Date:  06/30/2018   Name:  Katherine Baird   DOB:  23-Aug-1951   MRN:  [id number]   Chief Complaint: Back Pain (Patient is with sister [name] today. Seen UC- taking all meds as prescribed. Out of Zofran. Needs refill. Wants to discuss how to take meds. Taking pain meds every 6 hours and wants to know if she can take Zofran more often. Pain in back is at a 10 constant.  )  Urinary Tract Infection   This is a new problem. The current episode started in the past 7 days. The problem has been gradually improving. The patient is experiencing no pain. There has been no fever. Associated symptoms include flank pain. Pertinent negatives include no chills or hematuria. She has tried antibiotics for the symptoms. The treatment provided significant relief.  Back Pain  The current episode started 1 to 4 weeks ago. The problem occurs constantly. The problem is unchanged. The pain is present in the thoracic spine. The quality of the pain is described as aching. The pain is moderate. Associated symptoms include dysuria. Pertinent negatives include no abdominal pain, chest pain or fever. She has tried analgesics (percocet from UC) for the symptoms.  She was seen here last week.  Urine neg except for WBC felt to be vaginal contamination since pt c/o urethral/vaginal discomfort.  She was given metronidazole She then went to UC and had another UA with WBC.  She also had a CT stone study which was negative for stone. Urine culture + >100,000 colonies Staph Aureus sens to all except Cipro.  She was prescribed Keflex, oxycodone and zofran.  Review of Systems  Constitutional: Negative for chills, fatigue and fever.  Respiratory: Negative for cough and chest tightness.   Cardiovascular: Negative for chest pain, palpitations and leg swelling.  Gastrointestinal: Negative for abdominal pain.  Genitourinary: Positive for dysuria and flank pain. Negative for hematuria.  Musculoskeletal: Positive for arthralgias and back  pain.  Skin: Negative for rash.    Patient Active Problem List   Diagnosis Date Noted  . Acute midline thoracic back pain 06/23/2018  . Iron deficiency anemia 02/08/2018  . Fatigue 01/13/2018  . Chronic diastolic heart failure (HCC) 12/17/2017  . 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 Syncope 02/24/2017  . Hypothyroidism 05/24/2016  . HLD (hyperlipidemia) 01/27/2015  . Paroxysmal digital cyanosis 01/27/2015  . 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 Essential (primary) hypertension 01/11/2015  . Rheumatoid arthritis involving multiple joints (HCC) 01/11/2015  . 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 Levofloxacin Nausea And Vomiting  . Doxycycline   . Erythromycin   . Gabapentin   . Guaifenesin     "Felt sick" and worsening of symptoms  . Nitrofuran Derivatives   . Prednisone     muscle weakness/pain  . Sulfa Antibiotics   . Tetracycline   . Azithromycin Rash    Severe rash with peeling  . Carvedilol Other (See Comments)    Dose problem with higher doses    Past Surgical History:  Procedure Laterality Date  . ABDOMINAL HYSTERECTOMY    . LAPAROSCOPIC HYSTERECTOMY  2001   total    Social History   Tobacco Use  . Smoking status: Never Smoker  . Smokeless tobacco: Never Used  Substance Use Topics  . Alcohol use: No    Alcohol/week: 0.0 standard drinks  . Drug use: No     Medication list has been reviewed and updated.  Current Meds  Medication Sig  . baclofen (LIORESAL) 10 MG tablet Take 1 tablet (10 mg total) by mouth 3 (three) times daily for 10 days.  . carvedilol (COREG) 6.25 MG tablet Take 1 tablet by mouth 2 (two) times daily with a meal.   . cephALEXin (KEFLEX) 500 MG capsule Take 1 capsule (500 mg total) by mouth 3 (three) times daily.  . cholecalciferol (VITAMIN D) 400 units TABS tablet Take 400 Units by mouth daily.  Marland Kitchen denosumab (PROLIA) 60 MG/ML SOSY injection Inject 60 mg into the  skin every 6 (six) months.  . levothyroxine (SYNTHROID, LEVOTHROID) 50 MCG tablet 50 mcg daily before breakfast.   . metroNIDAZOLE (FLAGYL) 500 MG tablet Take 1 tablet (500 mg total) by mouth 3 (three) times daily for 7 days.  Marland Kitchen oxyCODONE-acetaminophen (PERCOCET) 5-325 MG tablet Take 1 tablet by mouth every 6 (six) hours as needed for moderate pain.    PHQ 2/9 Scores 04/06/2018 01/13/2018 09/19/2017 05/24/2016  PHQ - 2 Score 0 0 0 0    Physical Exam  Constitutional: She appears distressed.  Cardiovascular: Normal rate, regular rhythm and normal heart sounds.  Pulmonary/Chest: Effort normal and breath sounds normal.  Abdominal: Soft.  Musculoskeletal:       Thoracic back: She exhibits no tenderness.       Lumbar back: She exhibits no bony tenderness.  Kyphosis No palpable spasm Some discomfort over the CVA bilaterally   Urine dipstick shows positive for WBC's and positive for leukocytes.  Micro exam: 30 WBC's per HPF, 0 RBC's per HPF, 2+ bacteria, 0 crystals seen and 0 casts seen.   BP (!) 146/94 (BP Location: Right Arm, Patient Position: Sitting, Cuff Size: Normal)   Pulse 71   Temp 97.6 F (36.4 C) (Oral)   Ht 4\' 10"  (1.473 m)   Wt 101 lb (45.8 kg)   SpO2 100%   BMI 21.11 kg/m   Assessment and Plan: 1. Acute cystitis without hematuria Still evidence of infection and no sens to keflex so will change to augmentin Not convinced that her back pain is related to urinary tract infection - POC urinalysis w microscopic (non auto) - amoxicillin-clavulanate (AUGMENTIN) 875-125 MG tablet; Take 1 tablet by mouth 2 (two) times daily for 10 days.  Dispense: 20 tablet; Refill: 0  2. Acute bilateral thoracic back pain - Ambulatory referral to Orthopedic Surgery - traMADol (ULTRAM) 50 MG tablet; Take 1 tablet (50 mg total) by mouth every 6 (six) hours as needed.  Dispense: 20 tablet; Refill: 0   Partially dictated using . Any errors are unintentional.  Animal nutritionist,  MD [facility]  06/30/2018

## 2018-07-01 DIAGNOSIS — N3000 Acute cystitis without hematuria: Secondary | ICD-10-CM | POA: Diagnosis not present

## 2018-07-01 DIAGNOSIS — M545 Low back pain: Secondary | ICD-10-CM | POA: Diagnosis not present

## 2018-07-02 ENCOUNTER — Emergency Department: Payer: Medicare Other

## 2018-07-02 ENCOUNTER — Other Ambulatory Visit: Payer: Self-pay

## 2018-07-02 ENCOUNTER — Telehealth: Payer: Self-pay

## 2018-07-02 ENCOUNTER — Encounter: Payer: Self-pay | Admitting: Emergency Medicine

## 2018-07-02 ENCOUNTER — Inpatient Hospital Stay
Admission: EM | Admit: 2018-07-02 | Discharge: 2018-07-03 | DRG: 281 | Disposition: A | Discharge status: Home Health | Payer: Medicare Other | Attending: Specialist | Admitting: Specialist

## 2018-07-02 ENCOUNTER — Other Ambulatory Visit: Payer: Self-pay | Admitting: *Deleted

## 2018-07-02 DIAGNOSIS — I214 Non-ST elevation (NSTEMI) myocardial infarction: Secondary | ICD-10-CM

## 2018-07-02 DIAGNOSIS — I5031 Acute diastolic (congestive) heart failure: Secondary | ICD-10-CM | POA: Diagnosis not present

## 2018-07-02 DIAGNOSIS — E039 Hypothyroidism, unspecified: Secondary | ICD-10-CM | POA: Diagnosis present

## 2018-07-02 DIAGNOSIS — I110 Hypertensive heart disease with heart failure: Secondary | ICD-10-CM | POA: Diagnosis not present

## 2018-07-02 DIAGNOSIS — Z79890 Hormone replacement therapy (postmenopausal): Secondary | ICD-10-CM

## 2018-07-02 DIAGNOSIS — D649 Anemia, unspecified: Secondary | ICD-10-CM

## 2018-07-02 DIAGNOSIS — R109 Unspecified abdominal pain: Secondary | ICD-10-CM | POA: Diagnosis not present

## 2018-07-02 DIAGNOSIS — M810 Age-related osteoporosis without current pathological fracture: Secondary | ICD-10-CM | POA: Diagnosis not present

## 2018-07-02 DIAGNOSIS — I252 Old myocardial infarction: Secondary | ICD-10-CM | POA: Diagnosis present

## 2018-07-02 DIAGNOSIS — I5181 Takotsubo syndrome: Secondary | ICD-10-CM | POA: Diagnosis present

## 2018-07-02 DIAGNOSIS — Z79899 Other long term (current) drug therapy: Secondary | ICD-10-CM

## 2018-07-02 DIAGNOSIS — Z8249 Family history of ischemic heart disease and other diseases of the circulatory system: Secondary | ICD-10-CM | POA: Diagnosis not present

## 2018-07-02 DIAGNOSIS — M069 Rheumatoid arthritis, unspecified: Secondary | ICD-10-CM | POA: Diagnosis not present

## 2018-07-02 DIAGNOSIS — N390 Urinary tract infection, site not specified: Secondary | ICD-10-CM | POA: Diagnosis present

## 2018-07-02 DIAGNOSIS — I5032 Chronic diastolic (congestive) heart failure: Secondary | ICD-10-CM | POA: Diagnosis not present

## 2018-07-02 DIAGNOSIS — M546 Pain in thoracic spine: Secondary | ICD-10-CM

## 2018-07-02 DIAGNOSIS — A4901 Methicillin susceptible Staphylococcus aureus infection, unspecified site: Secondary | ICD-10-CM | POA: Diagnosis not present

## 2018-07-02 DIAGNOSIS — I10 Essential (primary) hypertension: Secondary | ICD-10-CM | POA: Diagnosis not present

## 2018-07-02 LAB — COMPREHENSIVE METABOLIC PANEL
ALT: 19 U/L (ref 0–44)
AST: 32 U/L (ref 15–41)
Albumin: 4.5 g/dL (ref 3.5–5.0)
Alkaline Phosphatase: 57 U/L (ref 38–126)
Anion gap: 12 (ref 5–15)
BUN: 17 mg/dL (ref 8–23)
CO2: 24 mmol/L (ref 22–32)
Calcium: 8.4 mg/dL — ABNORMAL LOW (ref 8.9–10.3)
Chloride: 100 mmol/L (ref 98–111)
Creatinine, Ser: 0.68 mg/dL (ref 0.44–1.00)
GFR calc Af Amer: >60 mL/min (ref >60)
GFR calc non Af Amer: >60 mL/min (ref >60)
Glucose, Bld: 86 mg/dL (ref 70–99)
Potassium: 4.2 mmol/L (ref 3.5–5.1)
Sodium: 136 mmol/L (ref 135–145)
Total Bilirubin: 1.3 mg/dL — ABNORMAL HIGH (ref 0.3–1.2)
Total Protein: 7.8 g/dL (ref 6.5–8.1)

## 2018-07-02 LAB — CBC
HEMATOCRIT: 47.3 % — AB (ref 36.0–46.0)
Hemoglobin: 15.3 g/dL — ABNORMAL HIGH (ref 12.0–15.0)
MCH: 29.8 pg (ref 26.0–34.0)
MCHC: 32.3 g/dL (ref 30.0–36.0)
MCV: 92.2 fL (ref 80.0–100.0)
PLATELETS: 325 10*3/uL (ref 150–400)
RBC: 5.13 MIL/uL — AB (ref 3.87–5.11)
RDW: 14.5 % (ref 11.5–15.5)
WBC: 9.6 10*3/uL (ref 4.0–10.5)
nRBC: 0 % (ref 0.0–0.2)

## 2018-07-02 LAB — URINALYSIS, COMPLETE (UACMP) WITH MICROSCOPIC
BACTERIA UA: NONE SEEN
BILIRUBIN URINE: NEGATIVE
GLUCOSE, UA: NEGATIVE mg/dL
KETONES UR: 80 mg/dL — AB
NITRITE: NEGATIVE
PROTEIN: 100 mg/dL — AB
Specific Gravity, Urine: >1.046 — ABNORMAL HIGH (ref 1.005–1.030)
WBC, UA: >50 WBC/hpf (ref 0–5)
pH: 6 (ref 5.0–8.0)

## 2018-07-02 LAB — TROPONIN I
Troponin I: 0.18 ng/mL (ref <0.03)
Troponin I: 0.26 ng/mL (ref <0.03)
Troponin I: 0.16 ng/mL (ref <0.03)

## 2018-07-02 LAB — APTT: aPTT: 48 seconds — ABNORMAL HIGH (ref 24–36)

## 2018-07-02 LAB — LIPASE, BLOOD: Lipase: 49 U/L (ref 11–51)

## 2018-07-02 LAB — HEPARIN LEVEL (UNFRACTIONATED): Heparin Unfractionated: 0.13 IU/mL — ABNORMAL LOW (ref 0.30–0.70)

## 2018-07-02 LAB — PROTIME-INR
INR: 1.09
PROTHROMBIN TIME: 14 s (ref 11.4–15.2)

## 2018-07-02 MED ORDER — ONDANSETRON 4 MG PO TBDP
4.0000 mg | ORAL_TABLET | Freq: Once | ORAL | Status: AC | PRN
  Administered 2018-07-02: 4 mg via ORAL
  Filled 2018-07-02: qty 1

## 2018-07-02 MED ORDER — SODIUM CHLORIDE 0.9 % WEIGHT BASED INFUSION: 1.0000 mL/kg/h | INTRAVENOUS | Status: DC

## 2018-07-02 MED ORDER — SODIUM CHLORIDE 0.9 % IV SOLN: 250.0000 mL | INTRAVENOUS | Status: DC | PRN

## 2018-07-02 MED ORDER — MORPHINE SULFATE (PF) 4 MG/ML IV SOLN
4.0000 mg | Freq: Once | INTRAVENOUS | Status: AC
  Administered 2018-07-02: 4 mg via INTRAVENOUS
  Filled 2018-07-02: qty 1

## 2018-07-02 MED ORDER — SODIUM CHLORIDE 0.9 % WEIGHT BASED INFUSION: 3.0000 mL/kg/h | INTRAVENOUS | Status: AC

## 2018-07-02 MED ORDER — SODIUM CHLORIDE 0.9% FLUSH
3.0000 mL | Freq: Two times a day (BID) | INTRAVENOUS | Status: DC
  Administered 2018-07-02 – 2018-07-03 (×2): 3 mL via INTRAVENOUS

## 2018-07-02 MED ORDER — ASPIRIN 81 MG PO CHEW
324.0000 mg | CHEWABLE_TABLET | Freq: Once | ORAL | Status: AC
  Administered 2018-07-02: 324 mg via ORAL
  Filled 2018-07-02: qty 4

## 2018-07-02 MED ORDER — OXYCODONE HCL 5 MG PO TABS
5.0000 mg | ORAL_TABLET | ORAL | Status: DC | PRN
  Administered 2018-07-02: 5 mg via ORAL
  Filled 2018-07-02: qty 1

## 2018-07-02 MED ORDER — ALBUTEROL SULFATE (2.5 MG/3ML) 0.083% IN NEBU: 2.5000 mg | INHALATION_SOLUTION | RESPIRATORY_TRACT | Status: DC | PRN

## 2018-07-02 MED ORDER — SODIUM CHLORIDE 0.9% FLUSH: 3.0000 mL | Freq: Two times a day (BID) | INTRAVENOUS | Status: DC

## 2018-07-02 MED ORDER — TRAMADOL HCL 50 MG PO TABS
50.0000 mg | ORAL_TABLET | Freq: Four times a day (QID) | ORAL | Status: DC | PRN
  Administered 2018-07-02 – 2018-07-03 (×3): 50 mg via ORAL
  Filled 2018-07-02 (×3): qty 1

## 2018-07-02 MED ORDER — NITROGLYCERIN IN D5W 200-5 MCG/ML-% IV SOLN
0.0000 ug/min | Freq: Once | INTRAVENOUS | Status: DC
  Filled 2018-07-02: qty 250

## 2018-07-02 MED ORDER — SODIUM CHLORIDE 0.9 % IV SOLN
Freq: Once | INTRAVENOUS | Status: AC
  Administered 2018-07-02: 14:00:00 via INTRAVENOUS

## 2018-07-02 MED ORDER — KETOROLAC TROMETHAMINE 30 MG/ML IJ SOLN: 30.0000 mg | Freq: Once | INTRAMUSCULAR | Status: DC

## 2018-07-02 MED ORDER — NITROGLYCERIN 0.4 MG SL SUBL
0.4000 mg | SUBLINGUAL_TABLET | SUBLINGUAL | Status: DC | PRN
  Administered 2018-07-02: 0.4 mg via SUBLINGUAL
  Filled 2018-07-02: qty 1

## 2018-07-02 MED ORDER — SODIUM CHLORIDE 0.9% FLUSH: 3.0000 mL | INTRAVENOUS | Status: DC | PRN

## 2018-07-02 MED ORDER — ACETAMINOPHEN 325 MG PO TABS: 650.0000 mg | ORAL_TABLET | Freq: Four times a day (QID) | ORAL | Status: DC | PRN

## 2018-07-02 MED ORDER — HEPARIN BOLUS VIA INFUSION
2700.0000 [IU] | Freq: Once | INTRAVENOUS | Status: AC
  Administered 2018-07-02: 2700 [IU] via INTRAVENOUS
  Filled 2018-07-02: qty 2700

## 2018-07-02 MED ORDER — ONDANSETRON HCL 4 MG/2ML IJ SOLN: 4.0000 mg | Freq: Four times a day (QID) | INTRAMUSCULAR | Status: DC | PRN

## 2018-07-02 MED ORDER — ONDANSETRON HCL 4 MG PO TABS: 4.0000 mg | ORAL_TABLET | Freq: Four times a day (QID) | ORAL | Status: DC | PRN

## 2018-07-02 MED ORDER — POLYETHYLENE GLYCOL 3350 17 G PO PACK: 17.0000 g | PACK | Freq: Every day | ORAL | Status: DC | PRN

## 2018-07-02 MED ORDER — IOPAMIDOL (ISOVUE-370) INJECTION 76%
75.0000 mL | Freq: Once | INTRAVENOUS | Status: AC | PRN
  Administered 2018-07-02: 75 mL via INTRAVENOUS

## 2018-07-02 MED ORDER — ASPIRIN EC 81 MG PO TBEC
81.0000 mg | DELAYED_RELEASE_TABLET | Freq: Every day | ORAL | Status: DC
  Administered 2018-07-03: 81 mg via ORAL
  Filled 2018-07-02: qty 1

## 2018-07-02 MED ORDER — ACETAMINOPHEN 650 MG RE SUPP: 650.0000 mg | Freq: Four times a day (QID) | RECTAL | Status: DC | PRN

## 2018-07-02 MED ORDER — HEPARIN (PORCINE) 25000 UT/250ML-% IV SOLN
700.0000 [IU]/h | INTRAVENOUS | Status: DC
  Administered 2018-07-02: 550 [IU]/h via INTRAVENOUS
  Filled 2018-07-02: qty 250

## 2018-07-02 MED ORDER — SODIUM CHLORIDE 0.9 % WEIGHT BASED INFUSION: 3.0000 mL/kg/h | INTRAVENOUS | Status: DC

## 2018-07-02 MED ORDER — HEPARIN BOLUS VIA INFUSION
1400.0000 [IU] | Freq: Once | INTRAVENOUS | Status: AC
  Administered 2018-07-02: 1400 [IU] via INTRAVENOUS
  Filled 2018-07-02: qty 1400

## 2018-07-02 MED ORDER — ONDANSETRON HCL 4 MG/2ML IJ SOLN
4.0000 mg | Freq: Once | INTRAMUSCULAR | Status: AC
  Administered 2018-07-02: 4 mg via INTRAVENOUS
  Filled 2018-07-02: qty 2

## 2018-07-02 MED ORDER — ASPIRIN 81 MG PO CHEW
81.0000 mg | CHEWABLE_TABLET | ORAL | Status: AC
  Administered 2018-07-03: 81 mg via ORAL
  Filled 2018-07-02: qty 1

## 2018-07-02 NOTE — Telephone Encounter (Signed)
She should go to ED if she does not urinate at all for 12 hours.  She should probably also stop the cyclobenzaprine until this problem resolves.

## 2018-07-02 NOTE — Telephone Encounter (Signed)
Called left 3 messages

## 2018-07-02 NOTE — ED Notes (Signed)
Report  Given to [place]

## 2018-07-02 NOTE — ED Notes (Signed)
Heparin administration verified by Jeannett Senior, RN.

## 2018-07-02 NOTE — Progress Notes (Signed)
cbc

## 2018-07-02 NOTE — ED Triage Notes (Addendum)
PT arrived with sister with concerns over inability to urinate for the last 24 hours. Pt also reports severe pain/pressure in her lower abdomen. Pt states she has had intermittent nausea episodes "for days." Pt was recently admitted for acute kidney infection. Pt was sent home with keflex but PCP switcher her to amoxicillin. Pt currently taking as prescribed. No more than found on bladder scan in triage.

## 2018-07-02 NOTE — Progress Notes (Addendum)
Text page sent to MD: Pt complaining of 7/10 back pain and states it is getting worse. She has received Roxicodone 5mg  PO with no relief at this time. Can she have something else ordered for pain?  Return message from MD: 0.4mg  Nitro sublingual given

## 2018-07-02 NOTE — ED Notes (Signed)
Hold nitro for now per fr valonese

## 2018-07-02 NOTE — Consult Note (Signed)
ANTICOAGULATION CONSULT NOTE - Initial Consult  Pharmacy Consult for Heparin Indication: chest pain/ACS  Allergies  Allergen Reactions  . Levofloxacin Nausea And Vomiting  . Doxycycline   . Erythromycin   . Gabapentin   . Guaifenesin     "Felt sick" and worsening of symptoms  . Nitrofuran Derivatives   . Prednisone     muscle weakness/pain  . Sulfa Antibiotics   . Tetracycline   . Azithromycin Rash    Severe rash with peeling  . Carvedilol Other (See Comments)    Dose problem with higher doses    Patient Measurements: Height: 4\' 11"  (149.9 cm) Weight: 101 lb (45.8 kg) IBW/kg (Calculated) : 43.2 Heparin Dosing Weight: 45.8  Vital Signs: Temp: 98.1 F (36.7 C) (11/07 1151) Temp Source: Oral (11/07 1151) BP: 159/110 (11/07 1151) Pulse Rate: 106 (11/07 1151)  Labs: Recent Labs    07/02/18 1205  HGB 15.3*  HCT 47.3*  PLT 325  CREATININE 0.68  TROPONINI 0.18*    Estimated Creatinine Clearance: 46.5 mL/min (by C-G formula based on SCr of 0.68 mg/dL).   Medical History: Past Medical History:  Diagnosis Date  . CHF (congestive heart failure) (HCC)   . Hypertension   . Osteoporosis   . Thyroid disease      Assessment: Pharmacy consulted for heparin per ACS/STEMI No anticoagulant prior to admission  Goal of Therapy:  Heparin level 0.3-0.7 units/ml Monitor platelets by anticoagulation protocol: Yes   Plan:  Heparin Bolus of 2700 units, followed by 550 units/hr infusion. Add-on labs APTT and INR ordered with CBC Following Anti-Xa level in 6 hours.   13/07/19, PharmD Clinical Pharmacist 07/02/2018 1:37 PM

## 2018-07-02 NOTE — Plan of Care (Signed)

## 2018-07-02 NOTE — ED Notes (Signed)
Pt placed on nasal 02 at 2 l / min

## 2018-07-02 NOTE — Progress Notes (Signed)
Advance care planning  Purpose of Encounter NSTEMI, code status  Parties in Attendance Patient. Family  Patients Decisional capacity Alert and oriented. Able to make medical decisions  Patient does not have a documented healthcare power of attorney.  She mentions she wants all of her family to make medical decisions if she is unable to.  Brother seems to live locally and on her family present in the room mentioned that he would be making decisions.  Encouraged to have documentation regarding this in place.  Discussed CODE STATUS and patient wants to be a full code.  FULL CODE  Time spent - 17 minutes

## 2018-07-02 NOTE — H&P (Signed)
[facility] [place] [facility]   PATIENT NAME: Katherine Baird    MR#:  [id number]  DATE OF BIRTH:  1950/12/17  DATE OF ADMISSION:  07/02/2018  PRIMARY CARE PHYSICIAN: Reubin Milan, MD   REQUESTING/REFERRING PHYSICIAN: Dr. Don Perking  CHIEF COMPLAINT:   Chief Complaint  Patient presents with  . Abdominal Pain    HISTORY OF PRESENT ILLNESS:  Katherine Baird  is a 67 y.o. female with a known history of hypertension, diastolic CHF presents to the hospital complaining of worsening upper back pain.  Patient has had this for a few days and saw her primary care physician.  She was also treated recently for Staphylococcus aureus UTI with amoxicillin. Here in the emergency room she had severe upper back pain and was found to have elevated troponin along with EKG changes initially concerning for possible inferior ST segment elevation but later T wave inversions.  Case was discussed with Dr. Adrienne Mocha of cardiology and patient started on heparin drip for non-ST elevation MI.  No prior history of cardiac catheterization.  No chest pain.  Patient does complain of exertional shortness of breath.  PAST MEDICAL HISTORY:   Past Medical History:  Diagnosis Date  . CHF (congestive heart failure) (HCC)   . Hypertension   . Osteoporosis   . Thyroid disease     PAST SURGICAL HISTORY:   Past Surgical History:  Procedure Laterality Date  . LAPAROSCOPIC HYSTERECTOMY  2001   total    SOCIAL HISTORY:   Social History   Tobacco Use  . Smoking status: Never Smoker  . Smokeless tobacco: Never Used  Substance Use Topics  . Alcohol use: No    Alcohol/week: 0.0 standard drinks    FAMILY HISTORY:   Family History  Problem Relation Age of Onset  . Arthritis Mother   . Dementia Mother   . Heart disease Father   . Mesothelioma Father   . Kidney cancer Sister   . Heart disease Sister   . Arthritis Sister   . COPD Brother   . Arthritis Brother   . Arthritis Brother   . COPD  Sister    No family history of premature coronary artery disease  DRUG ALLERGIES:   Allergies  Allergen Reactions  . Levofloxacin Nausea And Vomiting  . Doxycycline   . Erythromycin   . Gabapentin   . Guaifenesin     "Felt sick" and worsening of symptoms  . Nitrofuran Derivatives   . Prednisone     muscle weakness/pain  . Sulfa Antibiotics   . Tetracycline   . Azithromycin Rash    Severe rash with peeling  . Carvedilol Other (See Comments)    Dose problem with higher doses    REVIEW OF SYSTEMS:   ROS  MEDICATIONS AT HOME:   Prior to Admission medications   Medication Sig Start Date End Date Taking? Authorizing Provider  amoxicillin-clavulanate (AUGMENTIN) 875-125 MG tablet Take 1 tablet by mouth 2 (two) times daily for 10 days. 06/30/18 07/10/18 Yes Reubin Milan, MD  carvedilol (COREG) 6.25 MG tablet Take 6.25 mg by mouth 2 (two) times daily with a meal.    Yes [provider]  clobetasol ointment (TEMOVATE) 0.05 % Apply 1 application topically 2 (two) times daily as needed (for irritation).    Yes [provider]  cyclobenzaprine (FLEXERIL) 5 MG tablet Take 5 mg by mouth 2 (two) times daily as needed for muscle spasms.  07/01/18  Yes [provider]  denosumab (  PROLIA) 60 MG/ML SOSY injection Inject 60 mg into the skin every 6 (six) months.   Yes [provider]  folic acid (FOLVITE) 1 MG tablet Take 1 mg by mouth daily.   Yes [provider]  levothyroxine (SYNTHROID, LEVOTHROID) 50 MCG tablet Take 50 mcg by mouth daily before breakfast.    Yes [provider]  ondansetron (ZOFRAN ODT) 4 MG disintegrating tablet Take 1 tablet (4 mg total) by mouth every 8 (eight) hours as needed for nausea or vomiting. 06/30/18  Yes Reubin Milan, MD  traMADol (ULTRAM) 50 MG tablet Take 1 tablet (50 mg total) by mouth every 6 (six) hours as needed. Patient taking differently: Take 50 mg by mouth every 6 (six) hours as needed for  moderate pain or severe pain.  06/30/18  Yes Reubin Milan, MD  baclofen (LIORESAL) 10 MG tablet Take 1 tablet (10 mg total) by mouth 3 (three) times daily for 10 days. Patient not taking: Reported on 07/02/2018 06/23/18 07/03/18  Reubin Milan, MD  cephALEXin (KEFLEX) 500 MG capsule Take 1 capsule (500 mg total) by mouth 3 (three) times daily. Patient not taking: Reported on 07/02/2018 06/26/18   Tommi Rumps, PA-C  oxyCODONE-acetaminophen (PERCOCET) 5-325 MG tablet Take 1 tablet by mouth every 6 (six) hours as needed for moderate pain. Patient not taking: Reported on 07/02/2018 06/26/18   Tommi Rumps, PA-C     VITAL SIGNS:  Blood pressure 105/83, pulse (!) 104, temperature 98.1 F (36.7 C), temperature source Oral, resp. rate 12, height 4\' 11"  (1.499 m), weight 45.8 kg, SpO2 100 %.  PHYSICAL EXAMINATION:  Physical Exam  GENERAL:  67 y.o.-year-old patient lying in the bed with no acute distress.  EYES: Pupils equal, round, reactive to light and accommodation. No scleral icterus. Extraocular muscles intact.  HEENT: Head atraumatic, normocephalic. Oropharynx and nasopharynx clear. No oropharyngeal erythema, moist oral mucosa  NECK:  Supple, no jugular venous distention. No thyroid enlargement, no tenderness.  LUNGS: Normal breath sounds bilaterally, no wheezing, rales, rhonchi. No use of accessory muscles of respiration.  CARDIOVASCULAR: S1, S2 normal. No murmurs, rubs, or gallops.  ABDOMEN: Soft, nontender, nondistended. Bowel sounds present. No organomegaly or mass.  EXTREMITIES: No pedal edema, cyanosis, or clubbing. + 2 pedal & radial pulses b/l.   NEUROLOGIC: Cranial nerves II through XII are intact. No focal Motor or sensory deficits appreciated b/l PSYCHIATRIC: The patient is alert and oriented x 3. Good affect.  SKIN: No obvious rash, lesion, or ulcer.   LABORATORY PANEL:   CBC Recent Labs  Lab 07/02/18 1205  WBC 9.6  HGB 15.3*  HCT 47.3*  PLT 325    ------------------------------------------------------------------------------------------------------------------  Chemistries  Recent Labs  Lab 07/02/18 1205  NA 136  K 4.2  CL 100  CO2 24  GLUCOSE 86  BUN 17  CREATININE 0.68  CALCIUM 8.4*  AST 32  ALT 19  ALKPHOS 57  BILITOT 1.3*   ------------------------------------------------------------------------------------------------------------------  Cardiac Enzymes Recent Labs  Lab 07/02/18 1205  TROPONINI 0.18*   ------------------------------------------------------------------------------------------------------------------  RADIOLOGY:  Ct Angio Chest/abd/pel For Dissection W And/or Wo Contrast  Result Date: 07/02/2018 CLINICAL DATA:  Back and abdominal pain. EXAM: CT ANGIOGRAPHY CHEST, ABDOMEN AND PELVIS TECHNIQUE: Multidetector CT imaging through the chest, abdomen and pelvis was performed using the standard protocol during bolus administration of intravenous contrast. Multiplanar reconstructed images and MIPs were obtained and reviewed to evaluate the vascular anatomy. CONTRAST:  60mL ISOVUE-370 IOPAMIDOL (ISOVUE-370) INJECTION 76% COMPARISON:  CT scan of June 26, 2018. FINDINGS: CTA CHEST FINDINGS Cardiovascular: Preferential opacification of the thoracic aorta. No evidence of thoracic aortic aneurysm or dissection. Normal heart size. No pericardial effusion. Mediastinum/Nodes: No enlarged mediastinal, hilar, or axillary lymph nodes. Thyroid gland, trachea, and esophagus demonstrate no significant findings. Lungs/Pleura: No pneumothorax or pleural effusion is noted. 2 mm nodule is noted laterally in left upper lobe best seen on image number 48 of series 6. Musculoskeletal: No chest wall abnormality. No acute or significant osseous findings. Review of the MIP images confirms the above findings. CTA ABDOMEN AND PELVIS FINDINGS VASCULAR Aorta: Normal caliber aorta without aneurysm, dissection, vasculitis or significant  stenosis. Celiac: Patent without evidence of aneurysm, dissection, vasculitis or significant stenosis. SMA: Patent without evidence of aneurysm, dissection, vasculitis or significant stenosis. Renals: Both renal arteries are patent without evidence of aneurysm, dissection, vasculitis, fibromuscular dysplasia or significant stenosis. IMA: Patent without evidence of aneurysm, dissection, vasculitis or significant stenosis. Inflow: Patent without evidence of aneurysm, dissection, vasculitis or significant stenosis. Veins: No obvious venous abnormality within the limitations of this arterial phase study. Review of the MIP images confirms the above findings. NON-VASCULAR Hepatobiliary: No focal liver abnormality is seen. No gallstones, gallbladder wall thickening, or biliary dilatation. Pancreas: Unremarkable. No pancreatic ductal dilatation or surrounding inflammatory changes. Spleen: Normal in size without focal abnormality. Adrenals/Urinary Tract: Adrenal glands appear normal. Multiple small left renal cysts are noted. No hydronephrosis or renal obstruction is noted. Urinary bladder is unremarkable. Stomach/Bowel: Stool is noted throughout the colon. Stomach appears normal. There is no evidence of bowel obstruction. The appendix appears normal. Pessary device is in place for treatment of rectal and vaginal prolapse. Lymphatic: No significant vascular findings are present. No enlarged abdominal or pelvic lymph nodes. Reproductive: Status post hysterectomy. No adnexal masses. Other: No abdominal wall hernia or abnormality. No abdominopelvic ascites. Musculoskeletal: No acute or significant osseous findings. Review of the MIP images confirms the above findings. IMPRESSION: No evidence of thoracic or abdominal aortic dissection or aneurysm. The mesenteric and renal arteries are widely patent without significant stenosis. 2 mm nodule is noted in left lower lobe. No follow-up needed if patient is low-risk. Non-contrast  chest CT can be considered in 12 months if patient is high-risk. This recommendation follows the consensus statement: Guidelines for Management of Incidental Pulmonary Nodules Detected on CT Images: From the Fleischner Society 2017; Radiology 2017; 284:228-243. No acute abnormality is noted in the abdomen or pelvis. Electronically Signed   By: Lupita Raider, M.D.   On: 07/02/2018 13:29     IMPRESSION AND PLAN:   *Non-ST elevation MI.  Atypical presentation.  Does have EKG changes and troponin elevation with back pain.  Start heparin drip.  Aspirin daily.  Cardiology consult.  Check echocardiogram.  Will need cardiac catheterization.  Admit to telemetry floor with cardiac monitoring.  Repeat troponin.  Nitro as needed. Check lipid profile  *Hypertension.  Continue home medications.  IV as needed  *Diastolic congestive heart failure.  No signs of fluid overload.  Monitor.  *Recent Staphylococcus aureus UTI.  Patient also had this in January.  Will check blood cultures to rule out Staphylococcus bacteremia.  DVT prophylaxis.  On heparin drip.  All the records are reviewed and case discussed with ED provider. Management plans discussed with the patient, family and they are in agreement.  CODE STATUS: Full code  TOTAL TIME TAKING CARE OF THIS PATIENT: 40 minutes.   Orie Fisherman M.D on 07/02/2018 at 2:13 PM  Between 7am to 6pm - Pager - [phone]  After 6pm go to [email or web address] - password [email or web address] [facility]  [facility]  Office  [phone]  CC: Primary care physician; Reubin Milan, MD  Note: This dictation was prepared with Dragon dictation along with smaller phrase technology. Any transcriptional errors that result from this process are unintentional.

## 2018-07-02 NOTE — Progress Notes (Signed)
ANTICOAGULATION CONSULT NOTE - Initial Consult  Pharmacy Consult for Heparin  Indication: chest pain/ACS  Allergies  Allergen Reactions  . Levofloxacin Nausea And Vomiting  . Doxycycline   . Erythromycin   . Gabapentin   . Guaifenesin     "Felt sick" and worsening of symptoms  . Nitrofuran Derivatives   . Prednisone     muscle weakness/pain  . Sulfa Antibiotics   . Tetracycline   . Azithromycin Rash    Severe rash with peeling  . Carvedilol Other (See Comments)    Dose problem with higher doses    Patient Measurements: Height: 4\' 11"  (149.9 cm) Weight: 101 lb (45.8 kg) IBW/kg (Calculated) : 43.2 Heparin Dosing Weight:  45.8 kg   Vital Signs: Temp: 98 F (36.7 C) (11/07 1937) Temp Source: Oral (11/07 1937) BP: 104/63 (11/07 1937) Pulse Rate: 96 (11/07 1937)  Labs: Recent Labs    07/02/18 1205 07/02/18 1348 07/02/18 1506 07/02/18 1955  HGB 15.3*  --   --   --   HCT 47.3*  --   --   --   PLT 325  --   --   --   APTT  --  48*  --   --   LABPROT  --  14.0  --   --   INR  --  1.09  --   --   HEPARINUNFRC  --   --   --  0.13*  CREATININE 0.68  --   --   --   TROPONINI 0.18*  --  0.26* 0.16*    Estimated Creatinine Clearance: 46.5 mL/min (by C-G formula based on SCr of 0.68 mg/dL).   Medical History: Past Medical History:  Diagnosis Date  . CHF (congestive heart failure) (HCC)   . Hypertension   . Osteoporosis   . Thyroid disease     Medications:  Medications Prior to Admission  Medication Sig Dispense Refill Last Dose  . amoxicillin-clavulanate (AUGMENTIN) 875-125 MG tablet Take 1 tablet by mouth 2 (two) times daily for 10 days. 20 tablet 0 07/02/2018 at am  . carvedilol (COREG) 6.25 MG tablet Take 6.25 mg by mouth 2 (two) times daily with a meal.    07/01/2018 at 2230  . clobetasol ointment (TEMOVATE) 0.05 % Apply 1 application topically 2 (two) times daily as needed (for irritation).    unknown at unknown  . cyclobenzaprine (FLEXERIL) 5 MG tablet Take  5 mg by mouth 2 (two) times daily as needed for muscle spasms.    07/02/2018 at am  . denosumab (PROLIA) 60 MG/ML SOSY injection Inject 60 mg into the skin every 6 (six) months.   February 2019 at unknown  . folic acid (FOLVITE) 1 MG tablet Take 1 mg by mouth daily.   07/01/2018 at am  . levothyroxine (SYNTHROID, LEVOTHROID) 50 MCG tablet Take 50 mcg by mouth daily before breakfast.    07/01/2018 at am  . ondansetron (ZOFRAN ODT) 4 MG disintegrating tablet Take 1 tablet (4 mg total) by mouth every 8 (eight) hours as needed for nausea or vomiting. 30 tablet 1 unknown at unknown  . traMADol (ULTRAM) 50 MG tablet Take 1 tablet (50 mg total) by mouth every 6 (six) hours as needed. (Patient taking differently: Take 50 mg by mouth every 6 (six) hours as needed for moderate pain or severe pain. ) 20 tablet 0 07/02/2018 at am  . baclofen (LIORESAL) 10 MG tablet Take 1 tablet (10 mg total) by mouth 3 (three) times daily  for 10 days. (Patient not taking: Reported on 07/02/2018) 30 each 0 Not Taking at Unknown time  . cephALEXin (KEFLEX) 500 MG capsule Take 1 capsule (500 mg total) by mouth 3 (three) times daily. (Patient not taking: Reported on 07/02/2018) 30 capsule 0 Not Taking at Unknown time  . oxyCODONE-acetaminophen (PERCOCET) 5-325 MG tablet Take 1 tablet by mouth every 6 (six) hours as needed for moderate pain. (Patient not taking: Reported on 07/02/2018) 15 tablet 0 Not Taking at Unknown time   Assessment: Pharmacy consulted for heparin per ACS/STEMI No anticoagulant prior to admission  Goal of Therapy:  Heparin level 0.3-0.7 units/ml Monitor platelets by anticoagulation protocol: Yes   Plan:  Heparin Bolus of 2700 units, followed by 550 units/hr infusion. Add-on labs APTT and INR ordered with CBC Following Anti-Xa level in 6 hours.  11/07:  HL @ 20:00 = 0.13 Will order Heparin 1400 units IV X 1 bolus and increase drip rate to 700 units/hr. Will recheck HL 6 hrs after rate change.   Kasean Denherder  D 07/02/2018,8:54 PM

## 2018-07-02 NOTE — Telephone Encounter (Signed)
[name] called about Katherine Baird this morning and LM on VM stating she has not urinated since yesterday. She was given Flexeril at [facility] yesterday and has not went to urinate since taking it.   I called back and got VM and advised on VM to see [facility] or ER if pushing fluids and no urinating later this evening and advised if she has to urinate and can not even after fluids I would go ahead and have her seen at ER or [facility]. Other option is to call tomorrow when we are back in office.

## 2018-07-02 NOTE — ED Provider Notes (Signed)
[facility] Emergency Department Provider Note  ____________________________________________  Time seen: Approximately 1:03 PM  I have reviewed the triage vital signs and the nursing notes.   HISTORY  Chief Complaint Abdominal Pain   HPI Katherine Baird is a 67 y.o. female with a history of CHF, hypertension, osteoporosis, rheumatoid arthritis, connective tissue disease who presents for evaluation of back pain.  Patient reports that her symptoms have been ongoing for a week.  Initially she had upper back pain which is now migrated and includes her entire back.  The pain is sharp, constant and severe.  She was told initially that the pain was due to a UTI.  She has been taking antibiotics with no relief on the pain.  No nausea or vomiting.  The pain does not radiate to her chest.  No shortness of breath or dizziness.  No abdominal pain, fever or chills.   Past Medical History:  Diagnosis Date  . CHF (congestive heart failure) (HCC)   . Hypertension   . Osteoporosis   . Thyroid disease     Patient Active Problem List   Diagnosis Date Noted  . NSTEMI (non-ST elevated myocardial infarction) (HCC) 07/02/2018  . Acute midline thoracic back pain 06/23/2018  . Iron deficiency anemia 02/08/2018  . Fatigue 01/13/2018  . Chronic diastolic heart failure (HCC) 12/17/2017  . 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 Syncope 02/24/2017  . Hypothyroidism 05/24/2016  . HLD (hyperlipidemia) 01/27/2015  . Paroxysmal digital cyanosis 01/27/2015  . 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 Essential (primary) hypertension 01/11/2015  . Rheumatoid arthritis involving multiple joints (HCC) 01/11/2015  . 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 LAPAROSCOPIC HYSTERECTOMY  2001   total    Prior to Admission medications   Medication Sig Start Date End Date Taking? Authorizing Provider    amoxicillin-clavulanate (AUGMENTIN) 875-125 MG tablet Take 1 tablet by mouth 2 (two) times daily for 10 days. 06/30/18 07/10/18 Yes Reubin Milan, MD  carvedilol (COREG) 6.25 MG tablet Take 6.25 mg by mouth 2 (two) times daily with a meal.    Yes [provider]  clobetasol ointment (TEMOVATE) 0.05 % Apply 1 application topically 2 (two) times daily as needed (for irritation).    Yes [provider]  cyclobenzaprine (FLEXERIL) 5 MG tablet Take 5 mg by mouth 2 (two) times daily as needed for muscle spasms.  07/01/18  Yes [provider]  denosumab (PROLIA) 60 MG/ML SOSY injection Inject 60 mg into the skin every 6 (six) months.   Yes [provider]  folic acid (FOLVITE) 1 MG tablet Take 1 mg by mouth daily.   Yes [provider]  levothyroxine (SYNTHROID, LEVOTHROID) 50 MCG tablet Take 50 mcg by mouth daily before breakfast.    Yes [provider]  ondansetron (ZOFRAN ODT) 4 MG disintegrating tablet Take 1 tablet (4 mg total) by mouth every 8 (eight) hours as needed for nausea or vomiting. 06/30/18  Yes Reubin Milan, MD  traMADol (ULTRAM) 50 MG tablet Take 1 tablet (50 mg total) by mouth every 6 (six) hours as needed. Patient taking differently: Take 50 mg by mouth every 6 (six) hours as needed for moderate pain or severe pain.  06/30/18  Yes Reubin Milan, MD  baclofen (LIORESAL) 10 MG tablet Take 1 tablet (10 mg total) by mouth 3 (three) times daily for 10 days. Patient not taking: Reported on  07/02/2018 06/23/18 07/03/18  Reubin Milan, MD  cephALEXin (KEFLEX) 500 MG capsule Take 1 capsule (500 mg total) by mouth 3 (three) times daily. Patient not taking: Reported on 07/02/2018 06/26/18   Tommi Rumps, PA-C  oxyCODONE-acetaminophen (PERCOCET) 5-325 MG tablet Take 1 tablet by mouth every 6 (six) hours as needed for moderate pain. Patient not taking: Reported on 07/02/2018 06/26/18   Tommi Rumps, PA-C     Allergies Levofloxacin; Doxycycline; Erythromycin; Gabapentin; Guaifenesin; Nitrofuran derivatives; Prednisone; Sulfa antibiotics; Tetracycline; Azithromycin; and Carvedilol  Family History  Problem Relation Age of Onset  . Arthritis Mother   . Dementia Mother   . Heart disease Father   . Mesothelioma Father   . Kidney cancer Sister   . Heart disease Sister   . Arthritis Sister   . COPD Brother   . Arthritis Brother   . Arthritis Brother   . COPD Sister     Social History Social History   Tobacco Use  . Smoking status: Never Smoker  . Smokeless tobacco: Never Used  Substance Use Topics  . Alcohol use: No    Alcohol/week: 0.0 standard drinks  . Drug use: No    Review of Systems  Constitutional: Negative for fever. Eyes: Negative for visual changes. ENT: Negative for sore throat. Neck: No neck pain  Cardiovascular: +chest pain. Respiratory: Negative for shortness of breath. Gastrointestinal: Negative for abdominal pain, vomiting or diarrhea. Genitourinary: Negative for dysuria. Musculoskeletal: + back pain. Skin: Negative for rash. Neurological: Negative for headaches, weakness or numbness. Psych: No SI or HI  ____________________________________________   PHYSICAL EXAM:  VITAL SIGNS: ED Triage Vitals  Enc Vitals Group     BP 07/02/18 1151 (!) 159/110     Pulse Rate 07/02/18 1151 (!) 106     Resp 07/02/18 1151 14     Temp 07/02/18 1151 98.1 F (36.7 C)     Temp Source 07/02/18 1151 Oral     SpO2 07/02/18 1151 100 %     Weight 07/02/18 1151 101 lb (45.8 kg)     Height 07/02/18 1151 4\' 11"  (1.499 m)     Head Circumference --      Peak Flow --      Pain Score 07/02/18 1203 10     Pain Loc --      Pain Edu? --      Excl. in GC? --     Constitutional: Alert and oriented. Well appearing and in no apparent distress. HEENT:      Head: Normocephalic and atraumatic.         Eyes: Conjunctivae are normal. Sclera is non-icteric.       Mouth/Throat:  Mucous membranes are moist.       Neck: Supple with no signs of meningismus. Cardiovascular: Regular rate and rhythm. No murmurs, gallops, or rubs. 2+ symmetrical distal pulses are present in all extremities. No JVD. Respiratory: Normal respiratory effort. Lungs are clear to auscultation bilaterally. No wheezes, crackles, or rhonchi.  Gastrointestinal: Soft, non tender, and non distended with positive bowel sounds. No rebound or guarding. Musculoskeletal: Nontender with normal range of motion in all extremities. No edema, cyanosis, or erythema of extremities. Neurologic: Normal speech and language. Face is symmetric. Moving all extremities. No gross focal neurologic deficits are appreciated. Skin: Skin is warm, dry and intact. No rash noted. Psychiatric: Mood and affect are normal. Speech and behavior are normal.  ____________________________________________   LABS (all labs ordered are listed, but only abnormal results are  displayed)  Labs Reviewed  COMPREHENSIVE METABOLIC PANEL - Abnormal; Notable for the following components:      Result Value   Calcium 8.4 (*)    Total Bilirubin 1.3 (*)    All other components within normal limits  CBC - Abnormal; Notable for the following components:   RBC 5.13 (*)    Hemoglobin 15.3 (*)    HCT 47.3 (*)    All other components within normal limits  TROPONIN I - Abnormal; Notable for the following components:   Troponin I 0.18 (*)    All other components within normal limits  APTT - Abnormal; Notable for the following components:   aPTT 48 (*)    All other components within normal limits  CULTURE, BLOOD (ROUTINE X 2)  CULTURE, BLOOD (ROUTINE X 2)  LIPASE, BLOOD  PROTIME-INR  URINALYSIS, COMPLETE (UACMP) WITH MICROSCOPIC  HEPARIN LEVEL (UNFRACTIONATED)  HIV ANTIBODY (ROUTINE TESTING W REFLEX)  TROPONIN I  TROPONIN I   ____________________________________________  EKG  ED ECG REPORT I, Nita Sickle, the attending physician,  personally viewed and interpreted this ECG.   12:10 -normal sinus rhythm, rate of 94, prolonged QTC, normal axis, hyperacute inverted T waves in inferior and lateral leads with minimal elevation on lead III.  These changes are new when compared to prior.  12:41 -normal sinus rhythm, rate of 93, prolonged QTC, normal axis, persistent hyperacute inverted T waves in inferior and lateral leads now including lead III.  No ST elevation. ____________________________________________  RADIOLOGY  I have personally reviewed the images performed during this visit and I agree with the Radiologist's read.   Interpretation by Radiologist:  Ct Angio Chest/abd/pel For Dissection W And/or Wo Contrast  Result Date: 07/02/2018 CLINICAL DATA:  Back and abdominal pain. EXAM: CT ANGIOGRAPHY CHEST, ABDOMEN AND PELVIS TECHNIQUE: Multidetector CT imaging through the chest, abdomen and pelvis was performed using the standard protocol during bolus administration of intravenous contrast. Multiplanar reconstructed images and MIPs were obtained and reviewed to evaluate the vascular anatomy. CONTRAST:  75mL ISOVUE-370 IOPAMIDOL (ISOVUE-370) INJECTION 76% COMPARISON:  CT scan of June 26, 2018. FINDINGS: CTA CHEST FINDINGS Cardiovascular: Preferential opacification of the thoracic aorta. No evidence of thoracic aortic aneurysm or dissection. Normal heart size. No pericardial effusion. Mediastinum/Nodes: No enlarged mediastinal, hilar, or axillary lymph nodes. Thyroid gland, trachea, and esophagus demonstrate no significant findings. Lungs/Pleura: No pneumothorax or pleural effusion is noted. 2 mm nodule is noted laterally in left upper lobe best seen on image number 48 of series 6. Musculoskeletal: No chest wall abnormality. No acute or significant osseous findings. Review of the MIP images confirms the above findings. CTA ABDOMEN AND PELVIS FINDINGS VASCULAR Aorta: Normal caliber aorta without aneurysm, dissection, vasculitis or  significant stenosis. Celiac: Patent without evidence of aneurysm, dissection, vasculitis or significant stenosis. SMA: Patent without evidence of aneurysm, dissection, vasculitis or significant stenosis. Renals: Both renal arteries are patent without evidence of aneurysm, dissection, vasculitis, fibromuscular dysplasia or significant stenosis. IMA: Patent without evidence of aneurysm, dissection, vasculitis or significant stenosis. Inflow: Patent without evidence of aneurysm, dissection, vasculitis or significant stenosis. Veins: No obvious venous abnormality within the limitations of this arterial phase study. Review of the MIP images confirms the above findings. NON-VASCULAR Hepatobiliary: No focal liver abnormality is seen. No gallstones, gallbladder wall thickening, or biliary dilatation. Pancreas: Unremarkable. No pancreatic ductal dilatation or surrounding inflammatory changes. Spleen: Normal in size without focal abnormality. Adrenals/Urinary Tract: Adrenal glands appear normal. Multiple small left renal cysts are noted. No hydronephrosis  or renal obstruction is noted. Urinary bladder is unremarkable. Stomach/Bowel: Stool is noted throughout the colon. Stomach appears normal. There is no evidence of bowel obstruction. The appendix appears normal. Pessary device is in place for treatment of rectal and vaginal prolapse. Lymphatic: No significant vascular findings are present. No enlarged abdominal or pelvic lymph nodes. Reproductive: Status post hysterectomy. No adnexal masses. Other: No abdominal wall hernia or abnormality. No abdominopelvic ascites. Musculoskeletal: No acute or significant osseous findings. Review of the MIP images confirms the above findings. IMPRESSION: No evidence of thoracic or abdominal aortic dissection or aneurysm. The mesenteric and renal arteries are widely patent without significant stenosis. 2 mm nodule is noted in left lower lobe. No follow-up needed if patient is low-risk.  Non-contrast chest CT can be considered in 12 months if patient is high-risk. This recommendation follows the consensus statement: Guidelines for Management of Incidental Pulmonary Nodules Detected on CT Images: From the Fleischner Society 2017; Radiology 2017; 284:228-243. No acute abnormality is noted in the abdomen or pelvis. Electronically Signed   By: Lupita Raider, M.D.   On: 07/02/2018 13:29      ____________________________________________   PROCEDURES  Procedure(s) performed: None Procedures Critical Care performed: yes  CRITICAL CARE Performed by: Nita Sickle  ?  Total critical care time: 40 min  Critical care time was exclusive of separately billable procedures and treating other patients.  Critical care was necessary to treat or prevent imminent or life-threatening deterioration.  Critical care was time spent personally by me on the following activities: development of treatment plan with patient and/or surrogate as well as nursing, discussions with consultants, evaluation of patient's response to treatment, examination of patient, obtaining history from patient or surrogate, ordering and performing treatments and interventions, ordering and review of laboratory studies, ordering and review of radiographic studies, pulse oximetry and re-evaluation of patient's condition.  ____________________________________________   INITIAL IMPRESSION / ASSESSMENT AND PLAN / ED COURSE   67 y.o. female with a history of CHF, hypertension, osteoporosis, rheumatoid arthritis, connective tissue disease who presents for evaluation of back pain.  Patient initial EKG showing hyperacute inverted T waves in inferior and lateral leads concerning for cardiac ischemia versus dissection.  Since patient's main complaint was back pain which started on the upper back but has now radiated to her entire back she was sent stat to CT scan to rule out a dissection.  CT shows no evidence of dissection  or AAA.  At this time Dr. Darrold Junker was consulted. He agrees to come see patient in the ED. Will start patient on morphine, zofran, and nitro. Troponin added on to initial labs.    _________________________ 1:24 PM on 07/02/2018 -----------------------------------------  Troponin 0.18. Pain improved after morphine a nitro. Nitro being titrated. Heparin and asa ordered. Awaiting for Dr. Darrold Junker. Hospitalist will admit   As part of my medical decision making, I reviewed the following data within the electronic MEDICAL RECORD NUMBER Nursing notes reviewed and incorporated, Labs reviewed , EKG interpreted , Old EKG reviewed, Old chart reviewed, Radiograph reviewed , Discussed with admitting physician , A consult was requested and obtained from this/these consultant(s) Cardiology, Notes from prior ED visits and [place] Controlled Substance Database    Pertinent labs & imaging results that were available during my care of the patient were reviewed by me and considered in my medical decision making (see chart for details).    ____________________________________________   FINAL CLINICAL IMPRESSION(S) / ED DIAGNOSES  Final diagnoses:  NSTEMI (non-ST elevated  myocardial infarction) (HCC)      NEW MEDICATIONS STARTED DURING THIS VISIT:  ED Discharge Orders    None       Note:  This document was prepared using Dragon voice recognition software and may include unintentional dictation errors.    Don Perking, [place], MD 07/02/18 1515

## 2018-07-02 NOTE — Consult Note (Signed)
[facility] Cardiology  CARDIOLOGY CONSULT NOTE  Patient ID: Katherine Baird MRN: [id number] DOB/AGE: 01/17/1951 67 y.o.  Admit date: 07/02/2018 Referring Physician [facility] Primary Physician [facility] Primary Cardiologist Gwen Pounds Reason for Consultation non-ST elevation myocardial infarction  HPI: 67 year old female referred for evaluation of non-ST elevation myocardial infarction.  She has been in her usual state of health until approximately a week ago when she first noted upper back discomfort and symptoms consistent with a coccus aureus urinary tract infection treated with amoxicillin.  She reports the back pain has been remittent, past several days.  Patient presents to [facility] emergency room today persistent back pain.  ECG revealed inferolateral ST inversions.  Admission labs were notable for mildly elevated troponin of 0.18, and 0.26.  She was started on heparin drip with overall improvement of upper back pain.  Patient does report the back pain does radiate through to her chest.  Review of systems complete and found to be negative unless listed above     Past Medical History:  Diagnosis Date  . CHF (congestive heart failure) (HCC)   . Hypertension   . Osteoporosis   . Thyroid disease     Past Surgical History:  Procedure Laterality Date  . LAPAROSCOPIC HYSTERECTOMY  2001   total     (Not in a hospital admission) Social History   Socioeconomic History  . Marital status: Widowed    Spouse name: Not on file  . Number of children: Not on file  . Years of education: Not on file  . Highest education level: Not on file  Occupational History  . Not on file  Social Needs  . Financial resource strain: Not on file  . Food insecurity:    Worry: Not on file    Inability: Not on file  . Transportation needs:    Medical: Not on file    Non-medical: Not on file  Tobacco Use  . Smoking status: Never Smoker  . Smokeless tobacco: Never Used  Substance and Sexual Activity  . Alcohol use:  No    Alcohol/week: 0.0 standard drinks  . Drug use: No  . Sexual activity: Not Currently  Lifestyle  . Physical activity:    Days per week: Not on file    Minutes per session: Not on file  . Stress: Not on file  Relationships  . Social connections:    Talks on phone: Not on file    Gets together: Not on file    Attends religious service: Not on file    Active member of club or organization: Not on file    Attends meetings of clubs or organizations: Not on file    Relationship status: Not on file  . Intimate partner violence:    Fear of current or ex partner: Not on file    Emotionally abused: Not on file    Physically abused: Not on file    Forced sexual activity: Not on file  Other Topics Concern  . Not on file  Social History Narrative  . Not on file    Family History  Problem Relation Age of Onset  . Arthritis Mother   . Dementia Mother   . Heart disease Father   . Mesothelioma Father   . Kidney cancer Sister   . Heart disease Sister   . Arthritis Sister   . COPD Brother   . Arthritis Brother   . Arthritis Brother   . COPD Sister       Review of systems complete  and found to be negative unless listed above      PHYSICAL EXAM  General: Well developed, well nourished, in no acute distress HEENT:  Normocephalic and atramatic Neck:  No JVD.  Lungs: Clear bilaterally to auscultation and percussion. Heart: HRRR . Normal S1 and S2 without gallops or murmurs.  Abdomen: Bowel sounds are positive, abdomen soft and non-tender  Msk:  Back normal, normal gait. Normal strength and tone for age. Extremities: No clubbing, cyanosis or edema.   Neuro: Alert and oriented X 3. Psych:  Good affect, responds appropriately  Labs:   Lab Results  Component Value Date   WBC 9.6 07/02/2018   HGB 15.3 (H) 07/02/2018   HCT 47.3 (H) 07/02/2018   MCV 92.2 07/02/2018   PLT 325 07/02/2018   Recent Labs  Lab 07/02/18 1205  NA 136  K 4.2  CL 100  CO2 24  BUN 17   CREATININE 0.68  CALCIUM 8.4*  PROT 7.8  BILITOT 1.3*  ALKPHOS 57  ALT 19  AST 32  GLUCOSE 86   Lab Results  Component Value Date   TROPONINI 0.26 (HH) 07/02/2018   No results found for: CHOL No results found for: HDL No results found for: LDLCALC No results found for: TRIG No results found for: CHOLHDL No results found for: LDLDIRECT    Radiology: Dg Thoracic Spine 2 View  Result Date: 06/24/2018 CLINICAL DATA:  Back pain for 2-3 days. EXAM: THORACIC SPINE 2 VIEWS COMPARISON:  None. FINDINGS: Markedly exaggerated thoracic kyphosis but normal alignment of the thoracic vertebral bodies. Lower thoracic and lumbar scoliosis noted. Moderate degenerative changes. No acute fracture is identified. No abnormal paraspinal soft tissue swelling. Heart is normal in size and the visualized lungs are clear. IMPRESSION: Scoliosis and exaggerated thoracic kyphosis but no acute bony findings. Electronically Signed   By: Rudie Meyer M.D.   On: 06/24/2018 08:15   Ct Renal Stone Study  Result Date: 06/26/2018 CLINICAL DATA:  Flank and back pain.  Hematuria EXAM: CT ABDOMEN AND PELVIS WITHOUT CONTRAST TECHNIQUE: Multidetector CT imaging of the abdomen and pelvis was performed following the standard protocol without oral or IV contrast. COMPARISON:  Lumbar CT February 01, 2017 which included kidneys. FINDINGS: Lower chest: There is atelectatic change in the medial right base. There is a moderate hiatal hernia, incompletely visualized. Hepatobiliary: No focal liver lesions are appreciable on this noncontrast enhanced study. There is no appreciable gallbladder wall thickening. No evident biliary duct dilatation. Pancreas: There is no appreciable pancreatic mass or inflammatory focus. Spleen: No splenic lesions are evident. Adrenals/Urinary Tract: Adrenals bilaterally appear unremarkable. There is an 8 x 8 mm cyst in the lower pole left kidney. There are several subcentimeter increased attenuation areas in the lower  pole left kidney, likely subcentimeter hyperdense cysts. A slightly larger hyperdense appearing lesion, a likely hyperdense cyst is seen in the lower pole the left kidney measuring 1.0 x 0.8 cm. There is no appreciable hydronephrosis on either side. There is no appreciable renal or ureteral calculus on either side. Urinary bladder is midline with wall thickness within normal limits. Stomach/Bowel: There is diffuse stool throughout much of the colon. There is no appreciable bowel wall or mesenteric thickening. There is no evident bowel obstruction. No free air or portal venous air. There is a degree of rectal prolapse. Vascular/Lymphatic: There is aortic and iliac artery atherosclerosis. No aneurysm evident. Major mesenteric arterial vessels appear patent on this noncontrast enhanced study. There is no evident adenopathy in the  abdomen or pelvis. Reproductive: Uterus is absent. No evident pelvic mass. There is a degree of vaginal prolapse. Note that a pessary is in place. Other: Appendix appears normal. No abscess or ascites is evident in the abdomen or pelvis. Musculoskeletal: There is evidence of old trauma involving the medial right ischium and pubic symphysis with remodeling. There are foci of degenerative change in the lumbar spine. There is thoracolumbar dextroscoliosis. There are no blastic or lytic bone lesions. There is no appreciable intramuscular or abdominal wall lesion. IMPRESSION: 1. There is a degree of rectal and vaginal prolapse. Pessary in place. 2. No evident bowel wall or mesenteric thickening. No bowel obstruction. No abscess in the abdomen or pelvis. Appendix appears normal. Note that there is diffuse stool throughout colon. 3. Apparent protein containing small cysts left kidney. Appearance of these lesions is stable compared to prior MR examination. No renal or ureteral calculus present. No hydronephrosis. Note that a cause for hematuria has not been established with this study. 4.  Moderate  hiatal hernia. 5.  Aortoiliac atherosclerosis. Aortic Atherosclerosis (ICD10-I70.0). Electronically Signed   By: Bretta Bang III M.D.   On: 06/26/2018 13:34   Ct Angio Chest/abd/pel For Dissection W And/or Wo Contrast  Result Date: 07/02/2018 CLINICAL DATA:  Back and abdominal pain. EXAM: CT ANGIOGRAPHY CHEST, ABDOMEN AND PELVIS TECHNIQUE: Multidetector CT imaging through the chest, abdomen and pelvis was performed using the standard protocol during bolus administration of intravenous contrast. Multiplanar reconstructed images and MIPs were obtained and reviewed to evaluate the vascular anatomy. CONTRAST:  50mL ISOVUE-370 IOPAMIDOL (ISOVUE-370) INJECTION 76% COMPARISON:  CT scan of June 26, 2018. FINDINGS: CTA CHEST FINDINGS Cardiovascular: Preferential opacification of the thoracic aorta. No evidence of thoracic aortic aneurysm or dissection. Normal heart size. No pericardial effusion. Mediastinum/Nodes: No enlarged mediastinal, hilar, or axillary lymph nodes. Thyroid gland, trachea, and esophagus demonstrate no significant findings. Lungs/Pleura: No pneumothorax or pleural effusion is noted. 2 mm nodule is noted laterally in left upper lobe best seen on image number 48 of series 6. Musculoskeletal: No chest wall abnormality. No acute or significant osseous findings. Review of the MIP images confirms the above findings. CTA ABDOMEN AND PELVIS FINDINGS VASCULAR Aorta: Normal caliber aorta without aneurysm, dissection, vasculitis or significant stenosis. Celiac: Patent without evidence of aneurysm, dissection, vasculitis or significant stenosis. SMA: Patent without evidence of aneurysm, dissection, vasculitis or significant stenosis. Renals: Both renal arteries are patent without evidence of aneurysm, dissection, vasculitis, fibromuscular dysplasia or significant stenosis. IMA: Patent without evidence of aneurysm, dissection, vasculitis or significant stenosis. Inflow: Patent without evidence of  aneurysm, dissection, vasculitis or significant stenosis. Veins: No obvious venous abnormality within the limitations of this arterial phase study. Review of the MIP images confirms the above findings. NON-VASCULAR Hepatobiliary: No focal liver abnormality is seen. No gallstones, gallbladder wall thickening, or biliary dilatation. Pancreas: Unremarkable. No pancreatic ductal dilatation or surrounding inflammatory changes. Spleen: Normal in size without focal abnormality. Adrenals/Urinary Tract: Adrenal glands appear normal. Multiple small left renal cysts are noted. No hydronephrosis or renal obstruction is noted. Urinary bladder is unremarkable. Stomach/Bowel: Stool is noted throughout the colon. Stomach appears normal. There is no evidence of bowel obstruction. The appendix appears normal. Pessary device is in place for treatment of rectal and vaginal prolapse. Lymphatic: No significant vascular findings are present. No enlarged abdominal or pelvic lymph nodes. Reproductive: Status post hysterectomy. No adnexal masses. Other: No abdominal wall hernia or abnormality. No abdominopelvic ascites. Musculoskeletal: No acute or significant osseous findings. Review  of the MIP images confirms the above findings. IMPRESSION: No evidence of thoracic or abdominal aortic dissection or aneurysm. The mesenteric and renal arteries are widely patent without significant stenosis. 2 mm nodule is noted in left lower lobe. No follow-up needed if patient is low-risk. Non-contrast chest CT can be considered in 12 months if patient is high-risk. This recommendation follows the consensus statement: Guidelines for Management of Incidental Pulmonary Nodules Detected on CT Images: From the Fleischner Society 2017; Radiology 2017; 284:228-243. No acute abnormality is noted in the abdomen or pelvis. Electronically Signed   By: Lupita Raider, M.D.   On: 07/02/2018 13:29    EKG: Normal sinus rhythm with T wave inversions in inferolateral  leads  ASSESSMENT AND PLAN:   1.  Non-ST elevation myocardial infarction, with atypical presentation, with abnormal ECG, and mildly elevated troponin 2.  Essential hypertension 3.  Staphylococcus aureus UTI  Recommendations  1.  Agree with overall current therapy 2.  Continue heparin drip 3.  Cardiac catheterization with selective coronary arteriography scheduled for the a.m.  The risks, benefits alternatives of cardiac catheterization and possible PCI were explained to the patient and informed consent was obtained.  Signed: Marcina Millard MD,PhD, [facility] 07/02/2018, 4:28 PM

## 2018-07-02 NOTE — ED Notes (Signed)
EDP Don Perking confirms to hold Nitro gtt as BP is still soft. Pt states 1/10 chest pain. Heparin gtt running at 5.5.

## 2018-07-03 ENCOUNTER — Inpatient Hospital Stay
Admit: 2018-07-03 | Discharge: 2018-07-03 | Disposition: A | Discharge status: Home / Self Care | Payer: Medicare Other | Attending: Internal Medicine | Admitting: Internal Medicine

## 2018-07-03 ENCOUNTER — Encounter
Admission: EM | Disposition: A | Discharge status: Home Health | Payer: Self-pay | Source: Home / Self Care | Attending: Specialist

## 2018-07-03 ENCOUNTER — Encounter: Payer: Self-pay | Admitting: Cardiology

## 2018-07-03 HISTORY — PX: LEFT HEART CATH: CATH118248

## 2018-07-03 LAB — CBC
HCT: 40.5 % (ref 36.0–46.0)
HEMOGLOBIN: 13.2 g/dL (ref 12.0–15.0)
MCH: 30.1 pg (ref 26.0–34.0)
MCHC: 32.6 g/dL (ref 30.0–36.0)
MCV: 92.3 fL (ref 80.0–100.0)
Platelets: 290 10*3/uL (ref 150–400)
RBC: 4.39 MIL/uL (ref 3.87–5.11)
RDW: 14.4 % (ref 11.5–15.5)
WBC: 7.7 10*3/uL (ref 4.0–10.5)
nRBC: 0 % (ref 0.0–0.2)

## 2018-07-03 LAB — BASIC METABOLIC PANEL
Anion gap: 8 (ref 5–15)
BUN: 16 mg/dL (ref 8–23)
CO2: 23 mmol/L (ref 22–32)
Calcium: 7.5 mg/dL — ABNORMAL LOW (ref 8.9–10.3)
Chloride: 105 mmol/L (ref 98–111)
Creatinine, Ser: 0.58 mg/dL (ref 0.44–1.00)
GFR calc Af Amer: >60 mL/min (ref >60)
GFR calc non Af Amer: >60 mL/min (ref >60)
Glucose, Bld: 69 mg/dL — ABNORMAL LOW (ref 70–99)
Potassium: 3.6 mmol/L (ref 3.5–5.1)
Sodium: 136 mmol/L (ref 135–145)

## 2018-07-03 LAB — HIV ANTIBODY (ROUTINE TESTING W REFLEX): HIV SCREEN 4TH GENERATION: NONREACTIVE

## 2018-07-03 LAB — ECHOCARDIOGRAM COMPLETE
Height: 59 in
Weight: 1547.2 oz

## 2018-07-03 LAB — HEPARIN LEVEL (UNFRACTIONATED)
Heparin Unfractionated: 0.52 IU/mL (ref 0.30–0.70)
Heparin Unfractionated: 0.16 IU/mL — ABNORMAL LOW (ref 0.30–0.70)

## 2018-07-03 SURGERY — LEFT HEART CATH
Anesthesia: Moderate Sedation

## 2018-07-03 MED ORDER — HEPARIN (PORCINE) IN NACL 1000-0.9 UT/500ML-% IV SOLN
INTRAVENOUS | Status: AC
  Filled 2018-07-03: qty 1000

## 2018-07-03 MED ORDER — VERAPAMIL HCL 2.5 MG/ML IV SOLN
INTRAVENOUS | Status: DC | PRN
  Administered 2018-07-03: 2.5 mg via INTRA_ARTERIAL

## 2018-07-03 MED ORDER — ENOXAPARIN SODIUM 40 MG/0.4ML ~~LOC~~ SOLN: 40.0000 mg | SUBCUTANEOUS | Status: DC

## 2018-07-03 MED ORDER — SODIUM CHLORIDE 0.9 % IV SOLN: 250.0000 mL | INTRAVENOUS | Status: DC | PRN

## 2018-07-03 MED ORDER — ASPIRIN EC 325 MG PO TBEC
DELAYED_RELEASE_TABLET | ORAL | Status: AC
  Administered 2018-07-03: 325 mg via ORAL
  Filled 2018-07-03: qty 1

## 2018-07-03 MED ORDER — ACETAMINOPHEN 325 MG PO TABS
ORAL_TABLET | ORAL | Status: AC
  Filled 2018-07-03: qty 2

## 2018-07-03 MED ORDER — METOPROLOL SUCCINATE ER 25 MG PO TB24: 25.0000 mg | ORAL_TABLET | Freq: Every day | ORAL | Status: DC

## 2018-07-03 MED ORDER — SODIUM CHLORIDE 0.9% FLUSH: 3.0000 mL | INTRAVENOUS | Status: DC | PRN

## 2018-07-03 MED ORDER — FENTANYL CITRATE (PF) 100 MCG/2ML IJ SOLN
INTRAMUSCULAR | Status: AC
  Filled 2018-07-03: qty 2

## 2018-07-03 MED ORDER — SODIUM CHLORIDE 0.9 % WEIGHT BASED INFUSION: 1.0000 mL/kg/h | INTRAVENOUS | Status: DC

## 2018-07-03 MED ORDER — CARVEDILOL 3.125 MG PO TABS
3.1250 mg | ORAL_TABLET | Freq: Two times a day (BID) | ORAL | Status: DC
  Administered 2018-07-03 (×2): 3.125 mg via ORAL
  Filled 2018-07-03 (×2): qty 1

## 2018-07-03 MED ORDER — MIDAZOLAM HCL 2 MG/2ML IJ SOLN
INTRAMUSCULAR | Status: DC | PRN
  Administered 2018-07-03: 0.5 mg via INTRAVENOUS

## 2018-07-03 MED ORDER — SODIUM CHLORIDE 0.9% FLUSH: 3.0000 mL | Freq: Two times a day (BID) | INTRAVENOUS | Status: DC

## 2018-07-03 MED ORDER — MIDAZOLAM HCL 2 MG/2ML IJ SOLN
INTRAMUSCULAR | Status: AC
  Filled 2018-07-03: qty 2

## 2018-07-03 MED ORDER — VERAPAMIL HCL 2.5 MG/ML IV SOLN
INTRAVENOUS | Status: AC
  Filled 2018-07-03: qty 2

## 2018-07-03 MED ORDER — FOLIC ACID 1 MG PO TABS
1.0000 mg | ORAL_TABLET | Freq: Every day | ORAL | Status: DC
  Administered 2018-07-03: 1 mg via ORAL
  Filled 2018-07-03: qty 1

## 2018-07-03 MED ORDER — AMOXICILLIN-POT CLAVULANATE 875-125 MG PO TABS
1.0000 | ORAL_TABLET | Freq: Two times a day (BID) | ORAL | Status: DC
  Administered 2018-07-03: 1 via ORAL
  Filled 2018-07-03: qty 1

## 2018-07-03 MED ORDER — HEPARIN SODIUM (PORCINE) 1000 UNIT/ML IJ SOLN
INTRAMUSCULAR | Status: DC | PRN
  Administered 2018-07-03: 2000 [IU] via INTRAVENOUS

## 2018-07-03 MED ORDER — ACETAMINOPHEN 325 MG PO TABS
650.0000 mg | ORAL_TABLET | ORAL | Status: DC | PRN
  Administered 2018-07-03: 650 mg via ORAL

## 2018-07-03 MED ORDER — LISINOPRIL 5 MG PO TABS
5.0000 mg | ORAL_TABLET | Freq: Every day | ORAL | Status: DC
  Administered 2018-07-03: 5 mg via ORAL
  Filled 2018-07-03: qty 1

## 2018-07-03 MED ORDER — SODIUM CHLORIDE 0.9 % WEIGHT BASED INFUSION
1.0000 mL/kg/h | INTRAVENOUS | Status: DC
  Administered 2018-07-03: 1 mL/kg/h via INTRAVENOUS

## 2018-07-03 MED ORDER — ASPIRIN EC 325 MG PO TBEC
325.0000 mg | DELAYED_RELEASE_TABLET | Freq: Once | ORAL | Status: AC
  Administered 2018-07-03: 325 mg via ORAL

## 2018-07-03 MED ORDER — TRAMADOL HCL 50 MG PO TABS: 50.0000 mg | ORAL_TABLET | Freq: Four times a day (QID) | ORAL | 0 refills | Status: AC | PRN

## 2018-07-03 MED ORDER — LEVOTHYROXINE SODIUM 50 MCG PO TABS: 50.0000 ug | ORAL_TABLET | Freq: Every day | ORAL | Status: DC

## 2018-07-03 MED ORDER — SODIUM CHLORIDE 0.9 % WEIGHT BASED INFUSION
3.0000 mL/kg/h | INTRAVENOUS | Status: AC
  Administered 2018-07-03: 3 mL/kg/h via INTRAVENOUS

## 2018-07-03 MED ORDER — HEPARIN SODIUM (PORCINE) 1000 UNIT/ML IJ SOLN
INTRAMUSCULAR | Status: AC
  Filled 2018-07-03: qty 1

## 2018-07-03 MED ORDER — FENTANYL CITRATE (PF) 100 MCG/2ML IJ SOLN
INTRAMUSCULAR | Status: DC | PRN
  Administered 2018-07-03: 25 ug via INTRAVENOUS

## 2018-07-03 MED ORDER — LISINOPRIL 5 MG PO TABS: 5.0000 mg | ORAL_TABLET | Freq: Every day | ORAL | 0 refills | Status: DC

## 2018-07-03 MED ORDER — ONDANSETRON HCL 4 MG/2ML IJ SOLN: 4.0000 mg | Freq: Four times a day (QID) | INTRAMUSCULAR | Status: DC | PRN

## 2018-07-03 SURGICAL SUPPLY — 10 items
CATH INFINITI 5 FR 3DRC (CATHETERS) ×2 IMPLANT
CATH INFINITI 5FR ANG PIGTAIL (CATHETERS) ×2 IMPLANT
CATH INFINITI 5FR TG (CATHETERS) ×2 IMPLANT
CATH INFINITI JR4 5F (CATHETERS) ×2 IMPLANT
DEVICE RAD TR BAND REGULAR (VASCULAR PRODUCTS) ×2 IMPLANT
GLIDESHEATH SLEND SS 6F .021 (SHEATH) ×2 IMPLANT
KIT MANI 3VAL PERCEP (MISCELLANEOUS) ×3 IMPLANT
PACK CARDIAC CATH (CUSTOM PROCEDURE TRAY) ×3 IMPLANT
WIRE HITORQ VERSACORE ST 145CM (WIRE) ×2 IMPLANT
WIRE ROSEN-J .035X260CM (WIRE) ×2 IMPLANT

## 2018-07-03 NOTE — Plan of Care (Signed)
  Problem: Clinical Measurements: Goal: Will remain free from infection Outcome: Progressing   Problem: Activity: Goal: Risk for activity intolerance will decrease Outcome: Progressing   Problem: Elimination: Goal: Will not experience complications related to urinary retention Outcome: Progressing   Problem: Pain Managment: Goal: General experience of comfort will improve Outcome: Progressing   Problem: Safety: Goal: Ability to remain free from injury will improve Outcome: Progressing   Problem: Skin Integrity: Goal: Risk for impaired skin integrity will decrease Outcome: Progressing   Problem: Activity: Goal: Ability to tolerate increased activity will improve Outcome: Progressing   Problem: Education: Goal: Understanding of CV disease, CV risk reduction, and recovery process will improve Outcome: Progressing

## 2018-07-03 NOTE — Progress Notes (Signed)
Patient discharged home with home health,instructions explained and well understood,prescriptions handed to patient ,escorted by family and staff member via wheel chair

## 2018-07-03 NOTE — Care Management Note (Addendum)
Case Management Note  Patient Details  Name: Katherine Baird MRN: [id number] Date of Birth: [birth year]  Subjective/Objective:    Patient is discharging today with home health RN, PT and aide.  Admitted with CP.  Sister is at bedside.  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  Patient is very weak.  Made referral to [facility] after offering choice.  The patient will be discharging to her mother's home and the address is [address] [place], [place] [address]  [phone].  Independent in all adls, denies issues accessing medical care, obtaining medications or with transportation.  Current with PCP.   Placed a [facility] referral                   Action/Plan:   Expected Discharge Date:  07/03/18               Expected Discharge Plan:  Home w Home Health Services  In-House Referral:     Discharge planning Services  CM Consult  Post Acute Care Choice:    Choice offered to:     DME Arranged:    DME Agency:     HH Arranged:  RN, PT, Nurse's Aide HH Agency:  [facility]  Status of Service:  Completed, signed off  If discussed at Long Length of Stay Meetings, dates discussed:    Additional Comments:  Sherren Kerns, RN 07/03/2018, 4:19 PM

## 2018-07-03 NOTE — Progress Notes (Signed)
*  PRELIMINARY RESULTS* Echocardiogram 2D Echocardiogram has been performed.  Katherine Baird Tyerra Loretto 07/03/2018, 12:13 PM

## 2018-07-03 NOTE — Progress Notes (Signed)
[employer] [facility] [place] [facility]      PATIENT NAME: Katherine Baird    MR#:  [id number]  DATE OF BIRTH:  [birth year]  SUBJECTIVE:   Patient presented to the hospital due to significant chest pain noted to have some EKG changes suggestive of inferior ischemia.  Patient is status post cardiac catheterization today showing no significant CAD but apical ballooning consistent with Takasubo cardiomyopathy.  She presently denies any chest pains or shortness of breath.  REVIEW OF SYSTEMS:    Review of Systems  Constitutional: Negative for chills and fever.  HENT: Negative for congestion and tinnitus.   Eyes: Negative for blurred vision and double vision.  Respiratory: Negative for cough, shortness of breath and wheezing.   Cardiovascular: Negative for chest pain, orthopnea and PND.  Gastrointestinal: Negative for abdominal pain, diarrhea, nausea and vomiting.  Genitourinary: Negative for dysuria and hematuria.  Neurological: Negative for dizziness, sensory change and focal weakness.  All other systems reviewed and are negative.   Nutrition: Heart Healthy Tolerating Diet: yes Tolerating PT: Ambulatory.   DRUG ALLERGIES:   Allergies  Allergen Reactions  . Levofloxacin Nausea And Vomiting  . Doxycycline   . Erythromycin   . Gabapentin   . Guaifenesin     "Felt sick" and worsening of symptoms  . Nitrofuran Derivatives   . Prednisone     muscle weakness/pain  . Sulfa Antibiotics   . Tetracycline   . Azithromycin Rash    Severe rash with peeling  . Carvedilol Other (See Comments)    Dose problem with higher doses    VITALS:  Blood pressure 140/76, pulse 77, temperature 97.9 F (36.6 C), temperature source Oral, resp. rate 16, height 4\' 11"  (1.499 m), weight 43.9 kg, SpO2 95 %.  PHYSICAL EXAMINATION:   Physical Exam  GENERAL:  67 y.o.-year-old thin patient lying in bed in no acute distress.  EYES: Pupils equal, round, reactive to light and accommodation. No  scleral icterus. Extraocular muscles intact.  HEENT: Head atraumatic, normocephalic. Oropharynx and nasopharynx clear.  NECK:  Supple, no jugular venous distention. No thyroid enlargement, no tenderness.  LUNGS: Normal breath sounds bilaterally, no wheezing, rales, rhonchi. No use of accessory muscles of respiration.  CARDIOVASCULAR: S1, S2 normal. No murmurs, rubs, or gallops.  ABDOMEN: Soft, nontender, nondistended. Bowel sounds present. No organomegaly or mass.  EXTREMITIES: No cyanosis, clubbing or edema b/l.    NEUROLOGIC: Cranial nerves II through XII are intact. No focal Motor or sensory deficits b/l.   PSYCHIATRIC: The patient is alert and oriented x 3.  SKIN: No obvious rash, lesion, or ulcer.    LABORATORY PANEL:   CBC Recent Labs  Lab 07/03/18 0255  WBC 7.7  HGB 13.2  HCT 40.5  PLT 290   ------------------------------------------------------------------------------------------------------------------  Chemistries  Recent Labs  Lab 07/02/18 1205 07/03/18 0255  NA 136 136  K 4.2 3.6  CL 100 105  CO2 24 23  GLUCOSE 86 69*  BUN 17 16  CREATININE 0.68 0.58  CALCIUM 8.4* 7.5*  AST 32  --   ALT 19  --   ALKPHOS 57  --   BILITOT 1.3*  --    ------------------------------------------------------------------------------------------------------------------  Cardiac Enzymes Recent Labs  Lab 07/02/18 1955  TROPONINI 0.16*   ------------------------------------------------------------------------------------------------------------------  RADIOLOGY:  Ct Angio Chest/abd/pel For Dissection W And/or Wo Contrast  Result Date: 07/02/2018 CLINICAL DATA:  Back and abdominal pain. EXAM: CT ANGIOGRAPHY CHEST, ABDOMEN AND PELVIS TECHNIQUE: Multidetector CT imaging through the chest, abdomen  and pelvis was performed using the standard protocol during bolus administration of intravenous contrast. Multiplanar reconstructed images and MIPs were obtained and reviewed to evaluate  the vascular anatomy. CONTRAST:  71mL ISOVUE-370 IOPAMIDOL (ISOVUE-370) INJECTION 76% COMPARISON:  CT scan of June 26, 2018. FINDINGS: CTA CHEST FINDINGS Cardiovascular: Preferential opacification of the thoracic aorta. No evidence of thoracic aortic aneurysm or dissection. Normal heart size. No pericardial effusion. Mediastinum/Nodes: No enlarged mediastinal, hilar, or axillary lymph nodes. Thyroid gland, trachea, and esophagus demonstrate no significant findings. Lungs/Pleura: No pneumothorax or pleural effusion is noted. 2 mm nodule is noted laterally in left upper lobe best seen on image number 48 of series 6. Musculoskeletal: No chest wall abnormality. No acute or significant osseous findings. Review of the MIP images confirms the above findings. CTA ABDOMEN AND PELVIS FINDINGS VASCULAR Aorta: Normal caliber aorta without aneurysm, dissection, vasculitis or significant stenosis. Celiac: Patent without evidence of aneurysm, dissection, vasculitis or significant stenosis. SMA: Patent without evidence of aneurysm, dissection, vasculitis or significant stenosis. Renals: Both renal arteries are patent without evidence of aneurysm, dissection, vasculitis, fibromuscular dysplasia or significant stenosis. IMA: Patent without evidence of aneurysm, dissection, vasculitis or significant stenosis. Inflow: Patent without evidence of aneurysm, dissection, vasculitis or significant stenosis. Veins: No obvious venous abnormality within the limitations of this arterial phase study. Review of the MIP images confirms the above findings. NON-VASCULAR Hepatobiliary: No focal liver abnormality is seen. No gallstones, gallbladder wall thickening, or biliary dilatation. Pancreas: Unremarkable. No pancreatic ductal dilatation or surrounding inflammatory changes. Spleen: Normal in size without focal abnormality. Adrenals/Urinary Tract: Adrenal glands appear normal. Multiple small left renal cysts are noted. No hydronephrosis or renal  obstruction is noted. Urinary bladder is unremarkable. Stomach/Bowel: Stool is noted throughout the colon. Stomach appears normal. There is no evidence of bowel obstruction. The appendix appears normal. Pessary device is in place for treatment of rectal and vaginal prolapse. Lymphatic: No significant vascular findings are present. No enlarged abdominal or pelvic lymph nodes. Reproductive: Status post hysterectomy. No adnexal masses. Other: No abdominal wall hernia or abnormality. No abdominopelvic ascites. Musculoskeletal: No acute or significant osseous findings. Review of the MIP images confirms the above findings. IMPRESSION: No evidence of thoracic or abdominal aortic dissection or aneurysm. The mesenteric and renal arteries are widely patent without significant stenosis. 2 mm nodule is noted in left lower lobe. No follow-up needed if patient is low-risk. Non-contrast chest CT can be considered in 12 months if patient is high-risk. This recommendation follows the consensus statement: Guidelines for Management of Incidental Pulmonary Nodules Detected on CT Images: From the Fleischner Society 2017; Radiology 2017; 284:228-243. No acute abnormality is noted in the abdomen or pelvis. Electronically Signed   By: Lupita Raider, M.D.   On: 07/02/2018 13:29     ASSESSMENT AND PLAN:   66 year old female with past medical history of hypertension, hypothyroidism, osteoporosis, who presented to the hospital due to chest pain.  1. Chest Pain -patient presented with chest pain and noted to have some EKG changes suggestive of inferior ischemia.  Admitted to the hospital, started on IV heparin drip and aspirin.  Cardiology consult obtained. - Status post cardiac catheterization this morning showing no significant CAD but apical ballooning consistent with Takasubo cardiomyopathy. - Echocardiogram done but results pending.  Continue low-dose carvedilol, lisinopril, ASA and off heparin gtt due to negative cath.   2.  Essential HTN - cont. Coreg, lisinopril.   3. Hx of recent UTI - cont. Augmentin and finish course  4. Hypothyroidism - cont. Synthroid.   Possible d/c home tomorrow.   All the records are reviewed and case discussed with Care Management/Social Worker. Management plans discussed with the patient, family and they are in agreement.  CODE STATUS: Full code  DVT Prophylaxis: Lovenox  TOTAL TIME TAKING CARE OF THIS PATIENT: 30 minutes.   POSSIBLE D/C IN 1-2 DAYS, DEPENDING ON CLINICAL CONDITION.   Houston Siren M.D on 07/03/2018 at 2:32 PM  Between 7am to 6pm - Pager - [phone]  After 6pm go to [email or web address] - Social research officer, government  [employer] [facility] [place] [facility]  Office  [phone]  CC: Primary care physician; Reubin Milan, MD

## 2018-07-03 NOTE — Discharge Summary (Signed)
[facility] [place] [facility]   PATIENT NAME: Katherine Baird    MR#:  [id number]  DATE OF BIRTH:  10/12/1950  DATE OF ADMISSION:  07/02/2018 ADMITTING PHYSICIAN: Milagros Loll, MD  DATE OF DISCHARGE: 07/03/2018  PRIMARY CARE PHYSICIAN: Reubin Milan, MD    ADMISSION DIAGNOSIS:  NSTEMI (non-ST elevated myocardial infarction) (HCC) [I21.4]  DISCHARGE DIAGNOSIS:  Active Problems:   NSTEMI (non-ST elevated myocardial infarction) (HCC)   SECONDARY DIAGNOSIS:   Past Medical History:  Diagnosis Date  . CHF (congestive heart failure) (HCC)   . Hypertension   . Osteoporosis   . Thyroid disease     HOSPITAL COURSE:   67 year old female with past medical history of hypertension, hypothyroidism, osteoporosis, who presented to the hospital due to chest pain.  1. Chest Pain -patient presented with chest pain and noted to have some EKG changes suggestive of inferior ischemia.  Admitted to the hospital, started on IV heparin drip and aspirin.  Cardiology consult obtained. - Status post cardiac catheterization this morning showing no significant CAD but apical ballooning consistent with Takasubo cardiomyopathy. Echocardiogram done showing ejection fraction to be normal with akinesis of the apical myocardium consistent with Takasubo cardiomyopathy.  Patient was ambulated and is clinically asymptomatic and doing well. - She will be discharged on her carvedilol, low-dose lisinopril was added. -Follow-up with cardiology in the next 2 weeks.  2. Essential HTN - cont. Coreg, lisinopril.   3. Hx of recent UTI - cont. Augmentin and finish course  4. Hypothyroidism - cont. Synthroid.   DISCHARGE CONDITIONS:   Stable  CONSULTS OBTAINED:  Treatment Team:  Marcina Millard, MD  DRUG ALLERGIES:   Allergies  Allergen Reactions  . Levofloxacin Nausea And Vomiting  . Doxycycline   . Erythromycin   . Gabapentin   . Guaifenesin     "Felt sick" and worsening of  symptoms  . Nitrofuran Derivatives   . Prednisone     muscle weakness/pain  . Sulfa Antibiotics   . Tetracycline   . Azithromycin Rash    Severe rash with peeling  . Carvedilol Other (See Comments)    Dose problem with higher doses    DISCHARGE MEDICATIONS:   Allergies as of 07/03/2018      Reactions   Levofloxacin Nausea And Vomiting   Doxycycline    Erythromycin    Gabapentin    Guaifenesin    "Felt sick" and worsening of symptoms   Nitrofuran Derivatives    Prednisone    muscle weakness/pain   Sulfa Antibiotics    Tetracycline    Azithromycin Rash   Severe rash with peeling   Carvedilol Other (See Comments)   Dose problem with higher doses      Medication List    STOP taking these medications   baclofen 10 MG tablet Commonly known as:  LIORESAL   cephALEXin 500 MG capsule Commonly known as:  KEFLEX   oxyCODONE-acetaminophen 5-325 MG tablet Commonly known as:  PERCOCET/ROXICET     TAKE these medications   amoxicillin-clavulanate 875-125 MG tablet Commonly known as:  AUGMENTIN Take 1 tablet by mouth 2 (two) times daily for 10 days.   carvedilol 6.25 MG tablet Commonly known as:  COREG Take 6.25 mg by mouth 2 (two) times daily with a meal.   clobetasol ointment 0.05 % Commonly known as:  TEMOVATE Apply 1 application topically 2 (two) times daily as needed (for irritation).   cyclobenzaprine 5 MG tablet Commonly known as:  FLEXERIL Take 5  mg by mouth 2 (two) times daily as needed for muscle spasms.   denosumab 60 MG/ML Sosy injection Commonly known as:  PROLIA Inject 60 mg into the skin every 6 (six) months.   folic acid 1 MG tablet Commonly known as:  FOLVITE Take 1 mg by mouth daily.   levothyroxine 50 MCG tablet Commonly known as:  SYNTHROID, LEVOTHROID Take 50 mcg by mouth daily before breakfast.   lisinopril 5 MG tablet Commonly known as:  PRINIVIL,ZESTRIL Take 1 tablet (5 mg total) by mouth daily. Start taking on:  07/04/2018    ondansetron 4 MG disintegrating tablet Commonly known as:  ZOFRAN-ODT Take 1 tablet (4 mg total) by mouth every 8 (eight) hours as needed for nausea or vomiting.   traMADol 50 MG tablet Commonly known as:  ULTRAM Take 1 tablet (50 mg total) by mouth every 6 (six) hours as needed for moderate pain or severe pain.         DISCHARGE INSTRUCTIONS:   DIET:  Cardiac diet  DISCHARGE CONDITION:  Stable  ACTIVITY:  Activity as tolerated  OXYGEN:  Home Oxygen: No.   Oxygen Delivery: room air  DISCHARGE LOCATION:  Home with Home health PT, RN, Aide   If you experience worsening of your admission symptoms, develop shortness of breath, life threatening emergency, suicidal or homicidal thoughts you must seek medical attention immediately by calling 911 or calling your MD immediately  if symptoms less severe.  You Must read complete instructions/literature along with all the possible adverse reactions/side effects for all the Medicines you take and that have been prescribed to you. Take any new Medicines after you have completely understood and accpet all the possible adverse reactions/side effects.   Please note  You were cared for by a hospitalist during your hospital stay. If you have any questions about your discharge medications or the care you received while you were in the hospital after you are discharged, you can call the unit and asked to speak with the hospitalist on call if the hospitalist that took care of you is not available. Once you are discharged, your primary care physician will handle any further medical issues. Please note that NO REFILLS for any discharge medications will be authorized once you are discharged, as it is imperative that you return to your primary care physician (or establish a relationship with a primary care physician if you do not have one) for your aftercare needs so that they can reassess your need for medications and monitor your lab  values.   DATA REVIEW:   CBC Recent Labs  Lab 07/03/18 0255  WBC 7.7  HGB 13.2  HCT 40.5  PLT 290    Chemistries  Recent Labs  Lab 07/02/18 1205 07/03/18 0255  NA 136 136  K 4.2 3.6  CL 100 105  CO2 24 23  GLUCOSE 86 69*  BUN 17 16  CREATININE 0.68 0.58  CALCIUM 8.4* 7.5*  AST 32  --   ALT 19  --   ALKPHOS 57  --   BILITOT 1.3*  --     Cardiac Enzymes Recent Labs  Lab 07/02/18 1955  TROPONINI 0.16*    Microbiology Results  Results for orders placed or performed during the hospital encounter of 07/02/18  CULTURE, BLOOD (ROUTINE X 2) w Reflex to ID Panel     Status: None (Preliminary result)   Collection Time: 07/02/18  5:20 PM  Result Value Ref Range Status   Specimen Description BLOOD RIGHT ANTECUBITAL  Final   Special Requests   Final    BOTTLES DRAWN AEROBIC AND ANAEROBIC Blood Culture adequate volume   Culture   Final    NO GROWTH < 12 HOURS Performed at [facility], [address] [place], [place] [address]    Report Status PENDING  Incomplete  CULTURE, BLOOD (ROUTINE X 2) w Reflex to ID Panel     Status: None (Preliminary result)   Collection Time: 07/02/18  5:33 PM  Result Value Ref Range Status   Specimen Description BLOOD BLOOD RIGHT HAND  Final   Special Requests   Final    BOTTLES DRAWN AEROBIC ONLY Blood Culture results may not be optimal due to an inadequate volume of blood received in culture bottles   Culture   Final    NO GROWTH < 12 HOURS Performed at [facility], [address] [place], [place] [address]    Report Status PENDING  Incomplete    RADIOLOGY:  Ct Angio Chest/abd/pel For Dissection W And/or Wo Contrast  Result Date: 07/02/2018 CLINICAL DATA:  Back and abdominal pain. EXAM: CT ANGIOGRAPHY CHEST, ABDOMEN AND PELVIS TECHNIQUE: Multidetector CT imaging through the chest, abdomen and pelvis was performed using the standard protocol during bolus administration of intravenous contrast. Multiplanar  reconstructed images and MIPs were obtained and reviewed to evaluate the vascular anatomy. CONTRAST:  75mL ISOVUE-370 IOPAMIDOL (ISOVUE-370) INJECTION 76% COMPARISON:  CT scan of June 26, 2018. FINDINGS: CTA CHEST FINDINGS Cardiovascular: Preferential opacification of the thoracic aorta. No evidence of thoracic aortic aneurysm or dissection. Normal heart size. No pericardial effusion. Mediastinum/Nodes: No enlarged mediastinal, hilar, or axillary lymph nodes. Thyroid gland, trachea, and esophagus demonstrate no significant findings. Lungs/Pleura: No pneumothorax or pleural effusion is noted. 2 mm nodule is noted laterally in left upper lobe best seen on image number 48 of series 6. Musculoskeletal: No chest wall abnormality. No acute or significant osseous findings. Review of the MIP images confirms the above findings. CTA ABDOMEN AND PELVIS FINDINGS VASCULAR Aorta: Normal caliber aorta without aneurysm, dissection, vasculitis or significant stenosis. Celiac: Patent without evidence of aneurysm, dissection, vasculitis or significant stenosis. SMA: Patent without evidence of aneurysm, dissection, vasculitis or significant stenosis. Renals: Both renal arteries are patent without evidence of aneurysm, dissection, vasculitis, fibromuscular dysplasia or significant stenosis. IMA: Patent without evidence of aneurysm, dissection, vasculitis or significant stenosis. Inflow: Patent without evidence of aneurysm, dissection, vasculitis or significant stenosis. Veins: No obvious venous abnormality within the limitations of this arterial phase study. Review of the MIP images confirms the above findings. NON-VASCULAR Hepatobiliary: No focal liver abnormality is seen. No gallstones, gallbladder wall thickening, or biliary dilatation. Pancreas: Unremarkable. No pancreatic ductal dilatation or surrounding inflammatory changes. Spleen: Normal in size without focal abnormality. Adrenals/Urinary Tract: Adrenal glands appear normal.  Multiple small left renal cysts are noted. No hydronephrosis or renal obstruction is noted. Urinary bladder is unremarkable. Stomach/Bowel: Stool is noted throughout the colon. Stomach appears normal. There is no evidence of bowel obstruction. The appendix appears normal. Pessary device is in place for treatment of rectal and vaginal prolapse. Lymphatic: No significant vascular findings are present. No enlarged abdominal or pelvic lymph nodes. Reproductive: Status post hysterectomy. No adnexal masses. Other: No abdominal wall hernia or abnormality. No abdominopelvic ascites. Musculoskeletal: No acute or significant osseous findings. Review of the MIP images confirms the above findings. IMPRESSION: No evidence of thoracic or abdominal aortic dissection or aneurysm. The mesenteric and renal arteries are widely patent without significant stenosis. 2 mm  nodule is noted in left lower lobe. No follow-up needed if patient is low-risk. Non-contrast chest CT can be considered in 12 months if patient is high-risk. This recommendation follows the consensus statement: Guidelines for Management of Incidental Pulmonary Nodules Detected on CT Images: From the Fleischner Society 2017; Radiology 2017; 284:228-243. No acute abnormality is noted in the abdomen or pelvis. Electronically Signed   By: Lupita Raider, M.D.   On: 07/02/2018 13:29      Management plans discussed with the patient, family and they are in agreement.  CODE STATUS:     Code Status Orders  (From admission, onward)         Start     Ordered   07/02/18 1412  Full code  Continuous     07/02/18 1413        TOTAL TIME TAKING CARE OF THIS PATIENT: 40 minutes.    Houston Siren M.D on 07/03/2018 at 4:17 PM  Between 7am to 6pm - Pager - [phone]  After 6pm go to [email or web address] - Social research officer, government  [facility]  Office  [phone]  CC: Primary care physician; Reubin Milan, MD

## 2018-07-03 NOTE — Progress Notes (Signed)
ANTICOAGULATION CONSULT NOTE - Initial Consult  Pharmacy Consult for Heparin  Indication: chest pain/ACS  Allergies  Allergen Reactions  . Levofloxacin Nausea And Vomiting  . Doxycycline   . Erythromycin   . Gabapentin   . Guaifenesin     "Felt sick" and worsening of symptoms  . Nitrofuran Derivatives   . Prednisone     muscle weakness/pain  . Sulfa Antibiotics   . Tetracycline   . Azithromycin Rash    Severe rash with peeling  . Carvedilol Other (See Comments)    Dose problem with higher doses    Patient Measurements: Height: 4\' 11"  (149.9 cm) Weight: 101 lb (45.8 kg) IBW/kg (Calculated) : 43.2 Heparin Dosing Weight:  45.8 kg   Vital Signs: Temp: 98 F (36.7 C) (11/07 1937) Temp Source: Oral (11/07 1937) BP: 104/63 (11/07 1937) Pulse Rate: 96 (11/07 1937)  Labs: Recent Labs    07/02/18 1205 07/02/18 1348 07/02/18 1506 07/02/18 1955 07/03/18 0255  HGB 15.3*  --   --   --  13.2  HCT 47.3*  --   --   --  40.5  PLT 325  --   --   --  290  APTT  --  48*  --   --   --   LABPROT  --  14.0  --   --   --   INR  --  1.09  --   --   --   HEPARINUNFRC  --   --   --  0.13* 0.52  CREATININE 0.68  --   --   --  0.58  TROPONINI 0.18*  --  0.26* 0.16*  --     Estimated Creatinine Clearance: 46.5 mL/min (by C-G formula based on SCr of 0.58 mg/dL).   Medical History: Past Medical History:  Diagnosis Date  . CHF (congestive heart failure) (HCC)   . Hypertension   . Osteoporosis   . Thyroid disease     Medications:  Medications Prior to Admission  Medication Sig Dispense Refill Last Dose  . amoxicillin-clavulanate (AUGMENTIN) 875-125 MG tablet Take 1 tablet by mouth 2 (two) times daily for 10 days. 20 tablet 0 07/02/2018 at am  . carvedilol (COREG) 6.25 MG tablet Take 6.25 mg by mouth 2 (two) times daily with a meal.    07/01/2018 at 2230  . clobetasol ointment (TEMOVATE) 0.05 % Apply 1 application topically 2 (two) times daily as needed (for irritation).    unknown  at unknown  . cyclobenzaprine (FLEXERIL) 5 MG tablet Take 5 mg by mouth 2 (two) times daily as needed for muscle spasms.    07/02/2018 at am  . denosumab (PROLIA) 60 MG/ML SOSY injection Inject 60 mg into the skin every 6 (six) months.   February 2019 at unknown  . folic acid (FOLVITE) 1 MG tablet Take 1 mg by mouth daily.   07/01/2018 at am  . levothyroxine (SYNTHROID, LEVOTHROID) 50 MCG tablet Take 50 mcg by mouth daily before breakfast.    07/01/2018 at am  . ondansetron (ZOFRAN ODT) 4 MG disintegrating tablet Take 1 tablet (4 mg total) by mouth every 8 (eight) hours as needed for nausea or vomiting. 30 tablet 1 unknown at unknown  . traMADol (ULTRAM) 50 MG tablet Take 1 tablet (50 mg total) by mouth every 6 (six) hours as needed. (Patient taking differently: Take 50 mg by mouth every 6 (six) hours as needed for moderate pain or severe pain. ) 20 tablet 0 07/02/2018 at am  .  baclofen (LIORESAL) 10 MG tablet Take 1 tablet (10 mg total) by mouth 3 (three) times daily for 10 days. (Patient not taking: Reported on 07/02/2018) 30 each 0 Not Taking at Unknown time  . cephALEXin (KEFLEX) 500 MG capsule Take 1 capsule (500 mg total) by mouth 3 (three) times daily. (Patient not taking: Reported on 07/02/2018) 30 capsule 0 Not Taking at Unknown time  . oxyCODONE-acetaminophen (PERCOCET) 5-325 MG tablet Take 1 tablet by mouth every 6 (six) hours as needed for moderate pain. (Patient not taking: Reported on 07/02/2018) 15 tablet 0 Not Taking at Unknown time   Assessment: Pharmacy consulted for heparin per ACS/STEMI No anticoagulant prior to admission  Goal of Therapy:  Heparin level 0.3-0.7 units/ml Monitor platelets by anticoagulation protocol: Yes   Plan:  11/08 @ 0300 HL 0.52 therapeutic. Will continue current rate and will recheck HL @ 0900.  Thomasene Ripple 07/03/2018,4:47 AM

## 2018-07-05 NOTE — Progress Notes (Deleted)
[facility]  [phone] [phone]  ID: Clarisa Fling OB: 08/03/1951  MR#: [id number]  YQM#:[id number]  Patient Care Team: Reubin Milan, MD as PCP - General (Internal Medicine) Lamar Blinks, MD as Consulting Physician (Cardiology) Roena Malady Alvira Philips, MD as Referring Physician (Obstetrics and Gynecology) Fritzi Mandes, MD as Referring Physician (Dermatology) Deeann Saint, MD (Orthopedic Surgery) Georgette Dover, RN as [facility] Care Management  CHIEF COMPLAINT: Iron deficiency anemia.  INTERVAL HISTORY: Patient returns to clinic today for repeat laboratory work, further evaluation, and consideration of additional IV Feraheme.  Continues to have chronic weakness and fatigue that is unchanged.  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 She has no neurologic complaints.  She denies any recent fevers or illnesses.  She has a good appetite and denies weight loss.  She has no chest pain or shortness of breath.  She denies any nausea, vomiting, constipation, or diarrhea.  She has no melena or hematochezia.  She has no urinary complaints.  Patient offers no further specific complaints today.  REVIEW OF SYSTEMS:   Review of Systems  Constitutional: Positive for malaise/fatigue. Negative for fever and weight loss.  Respiratory: Negative.  Negative for cough and shortness of breath.   Cardiovascular: Negative.  Negative for chest pain and leg swelling.  Gastrointestinal: Negative.  Negative for abdominal pain, blood in stool and melena.  Genitourinary: Negative.  Negative for hematuria.  Musculoskeletal: Negative.  Negative for back pain.  Skin: Negative.  Negative for rash.  Neurological: Positive for weakness. Negative for sensory change and focal weakness.  Psychiatric/Behavioral: Negative.  The patient is not nervous/anxious.     As per HPI. Otherwise, a complete review of systems is  negative.  PAST MEDICAL HISTORY: Past Medical History:  Diagnosis Date  . CHF (congestive heart failure) (HCC)   . Hypertension   . Osteoporosis   . Thyroid disease     PAST SURGICAL HISTORY: Past Surgical History:  Procedure Laterality Date  . LAPAROSCOPIC HYSTERECTOMY  2001   total  . LEFT HEART CATH N/A 07/03/2018   Procedure: Left Heart Cath with Coronary Angiography;  Surgeon: Marcina Millard, MD;  Location: [facility] INVASIVE CV LAB;  Service: Cardiovascular;  Laterality: N/A;    FAMILY HISTORY: Family History  Problem Relation Age of Onset  . Arthritis Mother   . Dementia Mother   . Heart disease Father   . Mesothelioma Father   . Kidney cancer Sister   . Heart disease Sister   . Arthritis Sister   . COPD Brother   . Arthritis Brother   . Arthritis Brother   . COPD Sister     ADVANCED DIRECTIVES (Y/N):  N  HEALTH MAINTENANCE: Social History   Tobacco Use  . Smoking status: Never Smoker  . Smokeless tobacco: Never Used  Substance Use Topics  . Alcohol use: No    Alcohol/week: 0.0 standard drinks  . Drug use: No     Colonoscopy:  PAP:  Bone density:  Lipid panel:  Allergies  Allergen Reactions  . Levofloxacin Nausea And Vomiting  . Doxycycline   . Erythromycin   . Gabapentin   . Guaifenesin     "Felt sick" and worsening of symptoms  . Nitrofuran Derivatives   . Prednisone     muscle weakness/pain  . Sulfa Antibiotics   . Tetracycline   . Azithromycin Rash    Severe rash with peeling  . Carvedilol Other (See  Comments)    Dose problem with higher doses    Current Outpatient Medications  Medication Sig Dispense Refill  . amoxicillin-clavulanate (AUGMENTIN) 875-125 MG tablet Take 1 tablet by mouth 2 (two) times daily for 10 days. 20 tablet 0  . carvedilol (COREG) 6.25 MG tablet Take 6.25 mg by mouth 2 (two) times daily with a meal.     . clobetasol ointment (TEMOVATE) 0.05 % Apply 1 application topically 2 (two) times daily as needed (for  irritation).     . cyclobenzaprine (FLEXERIL) 5 MG tablet Take 5 mg by mouth 2 (two) times daily as needed for muscle spasms.     Marland Kitchen denosumab (PROLIA) 60 MG/ML SOSY injection Inject 60 mg into the skin every 6 (six) months.    . folic acid (FOLVITE) 1 MG tablet Take 1 mg by mouth daily.    Marland Kitchen levothyroxine (SYNTHROID, LEVOTHROID) 50 MCG tablet Take 50 mcg by mouth daily before breakfast.     . lisinopril (PRINIVIL,ZESTRIL) 5 MG tablet Take 1 tablet (5 mg total) by mouth daily. 30 tablet 0  . ondansetron (ZOFRAN ODT) 4 MG disintegrating tablet Take 1 tablet (4 mg total) by mouth every 8 (eight) hours as needed for nausea or vomiting. 30 tablet 1  . traMADol (ULTRAM) 50 MG tablet Take 1 tablet (50 mg total) by mouth every 6 (six) hours as needed for moderate pain or severe pain. 30 tablet 0   No current facility-administered medications for this visit.     OBJECTIVE: There were no vitals filed for this visit.   There is no height or weight on file to calculate BMI.    ECOG FS:0 - Asymptomatic  General: Well-developed, well-nourished, no acute distress. Eyes: Pink conjunctiva, anicteric sclera. HEENT: Normocephalic, moist mucous membranes. Lungs: Clear to auscultation bilaterally. Heart: Regular rate and rhythm. No rubs, murmurs, or gallops. Abdomen: Soft, nontender, nondistended. No organomegaly noted, normoactive bowel sounds. Musculoskeletal: No edema, cyanosis, or clubbing. Neuro: Alert, answering all questions appropriately. Cranial nerves grossly intact. Skin: No rashes or petechiae noted. Psych: Normal affect.  LAB RESULTS:  Lab Results  Component Value Date   NA 136 07/03/2018   K 3.6 07/03/2018   CL 105 07/03/2018   CO2 23 07/03/2018   GLUCOSE 69 (L) 07/03/2018   BUN 16 07/03/2018   CREATININE 0.58 07/03/2018   CALCIUM 7.5 (L) 07/03/2018   PROT 7.8 07/02/2018   ALBUMIN 4.5 07/02/2018   AST 32 07/02/2018   ALT 19 07/02/2018   ALKPHOS 57 07/02/2018   BILITOT 1.3 (H)  07/02/2018   GFRNONAA >60 07/03/2018   GFRAA >60 07/03/2018    Lab Results  Component Value Date   WBC 7.7 07/03/2018   NEUTROABS 3.2 04/07/2018   HGB 13.2 07/03/2018   HCT 40.5 07/03/2018   MCV 92.3 07/03/2018   PLT 290 07/03/2018   Lab Results  Component Value Date   IRON 76 04/07/2018   TIBC 209 (L) 04/07/2018   IRONPCTSAT 36 (H) 04/07/2018   Lab Results  Component Value Date   FERRITIN 246 04/07/2018     STUDIES: Dg Thoracic Spine 2 View  Result Date: 06/24/2018 CLINICAL DATA:  Back pain for 2-3 days. EXAM: THORACIC SPINE 2 VIEWS COMPARISON:  None. FINDINGS: Markedly exaggerated thoracic kyphosis but normal alignment of the thoracic vertebral bodies. Lower thoracic and lumbar scoliosis noted. Moderate degenerative changes. No acute fracture is identified. No abnormal paraspinal soft tissue swelling. Heart is normal in size and the visualized lungs are clear. IMPRESSION:  Scoliosis and exaggerated thoracic kyphosis but no acute bony findings. Electronically Signed   By: Rudie Meyer M.D.   On: 06/24/2018 08:15   Ct Renal Stone Study  Result Date: 06/26/2018 CLINICAL DATA:  Flank and back pain.  Hematuria EXAM: CT ABDOMEN AND PELVIS WITHOUT CONTRAST TECHNIQUE: Multidetector CT imaging of the abdomen and pelvis was performed following the standard protocol without oral or IV contrast. COMPARISON:  Lumbar CT February 01, 2017 which included kidneys. FINDINGS: Lower chest: There is atelectatic change in the medial right base. There is a moderate hiatal hernia, incompletely visualized. Hepatobiliary: No focal liver lesions are appreciable on this noncontrast enhanced study. There is no appreciable gallbladder wall thickening. No evident biliary duct dilatation. Pancreas: There is no appreciable pancreatic mass or inflammatory focus. Spleen: No splenic lesions are evident. Adrenals/Urinary Tract: Adrenals bilaterally appear unremarkable. There is an 8 x 8 mm cyst in the lower pole left  kidney. There are several subcentimeter increased attenuation areas in the lower pole left kidney, likely subcentimeter hyperdense cysts. A slightly larger hyperdense appearing lesion, a likely hyperdense cyst is seen in the lower pole the left kidney measuring 1.0 x 0.8 cm. There is no appreciable hydronephrosis on either side. There is no appreciable renal or ureteral calculus on either side. Urinary bladder is midline with wall thickness within normal limits. Stomach/Bowel: There is diffuse stool throughout much of the colon. There is no appreciable bowel wall or mesenteric thickening. There is no evident bowel obstruction. No free air or portal venous air. There is a degree of rectal prolapse. Vascular/Lymphatic: There is aortic and iliac artery atherosclerosis. No aneurysm evident. Major mesenteric arterial vessels appear patent on this noncontrast enhanced study. There is no evident adenopathy in the abdomen or pelvis. Reproductive: Uterus is absent. No evident pelvic mass. There is a degree of vaginal prolapse. Note that a pessary is in place. Other: Appendix appears normal. No abscess or ascites is evident in the abdomen or pelvis. Musculoskeletal: There is evidence of old trauma involving the medial right ischium and pubic symphysis with remodeling. There are foci of degenerative change in the lumbar spine. There is thoracolumbar dextroscoliosis. There are no blastic or lytic bone lesions. There is no appreciable intramuscular or abdominal wall lesion. IMPRESSION: 1. There is a degree of rectal and vaginal prolapse. Pessary in place. 2. No evident bowel wall or mesenteric thickening. No bowel obstruction. No abscess in the abdomen or pelvis. Appendix appears normal. Note that there is diffuse stool throughout colon. 3. Apparent protein containing small cysts left kidney. Appearance of these lesions is stable compared to prior MR examination. No renal or ureteral calculus present. No hydronephrosis. Note  that a cause for hematuria has not been established with this study. 4.  Moderate hiatal hernia. 5.  Aortoiliac atherosclerosis. Aortic Atherosclerosis (ICD10-I70.0). Electronically Signed   By: Bretta Bang III M.D.   On: 06/26/2018 13:34   Ct Angio Chest/abd/pel For Dissection W And/or Wo Contrast  Result Date: 07/02/2018 CLINICAL DATA:  Back and abdominal pain. EXAM: CT ANGIOGRAPHY CHEST, ABDOMEN AND PELVIS TECHNIQUE: Multidetector CT imaging through the chest, abdomen and pelvis was performed using the standard protocol during bolus administration of intravenous contrast. Multiplanar reconstructed images and MIPs were obtained and reviewed to evaluate the vascular anatomy. CONTRAST:  43mL ISOVUE-370 IOPAMIDOL (ISOVUE-370) INJECTION 76% COMPARISON:  CT scan of June 26, 2018. FINDINGS: CTA CHEST FINDINGS Cardiovascular: Preferential opacification of the thoracic aorta. No evidence of thoracic aortic aneurysm or dissection. Normal heart  size. No pericardial effusion. Mediastinum/Nodes: No enlarged mediastinal, hilar, or axillary lymph nodes. Thyroid gland, trachea, and esophagus demonstrate no significant findings. Lungs/Pleura: No pneumothorax or pleural effusion is noted. 2 mm nodule is noted laterally in left upper lobe best seen on image number 48 of series 6. Musculoskeletal: No chest wall abnormality. No acute or significant osseous findings. Review of the MIP images confirms the above findings. CTA ABDOMEN AND PELVIS FINDINGS VASCULAR Aorta: Normal caliber aorta without aneurysm, dissection, vasculitis or significant stenosis. Celiac: Patent without evidence of aneurysm, dissection, vasculitis or significant stenosis. SMA: Patent without evidence of aneurysm, dissection, vasculitis or significant stenosis. Renals: Both renal arteries are patent without evidence of aneurysm, dissection, vasculitis, fibromuscular dysplasia or significant stenosis. IMA: Patent without evidence of aneurysm,  dissection, vasculitis or significant stenosis. Inflow: Patent without evidence of aneurysm, dissection, vasculitis or significant stenosis. Veins: No obvious venous abnormality within the limitations of this arterial phase study. Review of the MIP images confirms the above findings. NON-VASCULAR Hepatobiliary: No focal liver abnormality is seen. No gallstones, gallbladder wall thickening, or biliary dilatation. Pancreas: Unremarkable. No pancreatic ductal dilatation or surrounding inflammatory changes. Spleen: Normal in size without focal abnormality. Adrenals/Urinary Tract: Adrenal glands appear normal. Multiple small left renal cysts are noted. No hydronephrosis or renal obstruction is noted. Urinary bladder is unremarkable. Stomach/Bowel: Stool is noted throughout the colon. Stomach appears normal. There is no evidence of bowel obstruction. The appendix appears normal. Pessary device is in place for treatment of rectal and vaginal prolapse. Lymphatic: No significant vascular findings are present. No enlarged abdominal or pelvic lymph nodes. Reproductive: Status post hysterectomy. No adnexal masses. Other: No abdominal wall hernia or abnormality. No abdominopelvic ascites. Musculoskeletal: No acute or significant osseous findings. Review of the MIP images confirms the above findings. IMPRESSION: No evidence of thoracic or abdominal aortic dissection or aneurysm. The mesenteric and renal arteries are widely patent without significant stenosis. 2 mm nodule is noted in left lower lobe. No follow-up needed if patient is low-risk. Non-contrast chest CT can be considered in 12 months if patient is high-risk. This recommendation follows the consensus statement: Guidelines for Management of Incidental Pulmonary Nodules Detected on CT Images: From the Fleischner Society 2017; Radiology 2017; 284:228-243. No acute abnormality is noted in the abdomen or pelvis. Electronically Signed   By: Lupita Raider, M.D.   On:  07/02/2018 13:29    ASSESSMENT: Iron deficiency anemia  PLAN:    1. Iron deficiency anemia: Patient's hemoglobin has significantly improved to 11.4 and her iron stores are now within normal limits. By report, patient cannot tolerate oral iron supplementation.  No intervention is needed at this time.  Patient does not require additional IV Feraheme.  Return to clinic in 3 months with repeat laboratory work and further evaluation.  2.  Weakness and fatigue: Likely multifactorial.  Unrelated to anemia. 3.  History of skin eruption: Occurred several days after her treatment with Feraheme.  Unclear if related to infusion, but patient may benefit from Benadryl and Tylenol premedication in the future.  I spent a total of 20 minutes face-to-face with the patient of which greater than 50% of the visit was spent in counseling and coordination of care as detailed above.  Patient expressed understanding and was in agreement with this plan. She also understands that She can call clinic at any time with any questions, concerns, or complaints.    Jeralyn Ruths, MD   07/05/2018 1:25 PM

## 2018-07-06 ENCOUNTER — Other Ambulatory Visit: Payer: Self-pay | Admitting: *Deleted

## 2018-07-06 ENCOUNTER — Telehealth: Payer: Self-pay

## 2018-07-06 NOTE — Patient Outreach (Signed)
  [facility]) Care Management  [facility] Care Manager  07/06/2018   Katherine Baird 05-17-1951 [id number]   Transition of care by Primary care provider office  Referral received : 11/8 Referral source : [facility] Care Manager Referral reason : [facility] inpatient admission , 117-11/8 NSTEMI , cardiac cath results : normal coronary anatomy , overall preserved left ventricular function ,Takosubo cardiomyopathy  Insurance : [facility]   PMHx noted : significant for Hypertension , Diastolic Heart failure, recent UTI , back pain,iron deficiency , osteoporosis,     Subjective:   Unsuccessful outreach call to patient , no answer unable to leave a message as voicemail box is full.   Plan:  Will send patient unsuccessful outreach letter and plan return call in the next 4 business days.    [name], RN, [facility] Care Management,Care Management Coordinator  [phone]- Mobile [phone]- Toll Free Main Office

## 2018-07-07 DIAGNOSIS — I5032 Chronic diastolic (congestive) heart failure: Secondary | ICD-10-CM | POA: Diagnosis not present

## 2018-07-07 DIAGNOSIS — E039 Hypothyroidism, unspecified: Secondary | ICD-10-CM | POA: Diagnosis not present

## 2018-07-07 DIAGNOSIS — I480 Paroxysmal atrial fibrillation: Secondary | ICD-10-CM | POA: Diagnosis not present

## 2018-07-07 DIAGNOSIS — M0689 Other specified rheumatoid arthritis, multiple sites: Secondary | ICD-10-CM | POA: Diagnosis not present

## 2018-07-07 DIAGNOSIS — N390 Urinary tract infection, site not specified: Secondary | ICD-10-CM | POA: Diagnosis not present

## 2018-07-07 DIAGNOSIS — Z9181 History of falling: Secondary | ICD-10-CM | POA: Diagnosis not present

## 2018-07-07 DIAGNOSIS — I110 Hypertensive heart disease with heart failure: Secondary | ICD-10-CM | POA: Diagnosis not present

## 2018-07-07 DIAGNOSIS — M810 Age-related osteoporosis without current pathological fracture: Secondary | ICD-10-CM | POA: Diagnosis not present

## 2018-07-07 DIAGNOSIS — Z792 Long term (current) use of antibiotics: Secondary | ICD-10-CM | POA: Diagnosis not present

## 2018-07-07 DIAGNOSIS — D509 Iron deficiency anemia, unspecified: Secondary | ICD-10-CM | POA: Diagnosis not present

## 2018-07-07 DIAGNOSIS — I214 Non-ST elevation (NSTEMI) myocardial infarction: Secondary | ICD-10-CM | POA: Diagnosis not present

## 2018-07-07 LAB — CULTURE, BLOOD (ROUTINE X 2)
Culture: NO GROWTH
Culture: NO GROWTH
SPECIAL REQUESTS: ADEQUATE

## 2018-07-08 ENCOUNTER — Other Ambulatory Visit: Payer: Self-pay

## 2018-07-08 ENCOUNTER — Emergency Department
Admission: EM | Admit: 2018-07-08 | Discharge: 2018-07-08 | Disposition: A | Discharge status: Home / Self Care | Payer: Medicare Other | Attending: Emergency Medicine | Admitting: Emergency Medicine

## 2018-07-08 ENCOUNTER — Emergency Department: Payer: Medicare Other

## 2018-07-08 ENCOUNTER — Encounter: Payer: Self-pay | Admitting: Emergency Medicine

## 2018-07-08 ENCOUNTER — Other Ambulatory Visit: Payer: Medicare Other

## 2018-07-08 DIAGNOSIS — I110 Hypertensive heart disease with heart failure: Secondary | ICD-10-CM | POA: Insufficient documentation

## 2018-07-08 DIAGNOSIS — I480 Paroxysmal atrial fibrillation: Secondary | ICD-10-CM | POA: Diagnosis not present

## 2018-07-08 DIAGNOSIS — M0689 Other specified rheumatoid arthritis, multiple sites: Secondary | ICD-10-CM | POA: Diagnosis not present

## 2018-07-08 DIAGNOSIS — Z9181 History of falling: Secondary | ICD-10-CM | POA: Diagnosis not present

## 2018-07-08 DIAGNOSIS — I5032 Chronic diastolic (congestive) heart failure: Secondary | ICD-10-CM | POA: Insufficient documentation

## 2018-07-08 DIAGNOSIS — D509 Iron deficiency anemia, unspecified: Secondary | ICD-10-CM | POA: Diagnosis not present

## 2018-07-08 DIAGNOSIS — E039 Hypothyroidism, unspecified: Secondary | ICD-10-CM | POA: Insufficient documentation

## 2018-07-08 DIAGNOSIS — I214 Non-ST elevation (NSTEMI) myocardial infarction: Secondary | ICD-10-CM | POA: Diagnosis not present

## 2018-07-08 DIAGNOSIS — Z79899 Other long term (current) drug therapy: Secondary | ICD-10-CM | POA: Diagnosis not present

## 2018-07-08 DIAGNOSIS — Z792 Long term (current) use of antibiotics: Secondary | ICD-10-CM | POA: Diagnosis not present

## 2018-07-08 DIAGNOSIS — R079 Chest pain, unspecified: Secondary | ICD-10-CM | POA: Insufficient documentation

## 2018-07-08 DIAGNOSIS — M810 Age-related osteoporosis without current pathological fracture: Secondary | ICD-10-CM | POA: Diagnosis not present

## 2018-07-08 DIAGNOSIS — N390 Urinary tract infection, site not specified: Secondary | ICD-10-CM | POA: Diagnosis not present

## 2018-07-08 LAB — CBC WITH DIFFERENTIAL/PLATELET
Abs Immature Granulocytes: 0.02 10*3/uL (ref 0.00–0.07)
BASOS ABS: 0.1 10*3/uL (ref 0.0–0.1)
BASOS PCT: 1 %
EOS ABS: 0.7 10*3/uL — AB (ref 0.0–0.5)
EOS PCT: 11 %
HEMATOCRIT: 42.7 % (ref 36.0–46.0)
Hemoglobin: 13.6 g/dL (ref 12.0–15.0)
Immature Granulocytes: 0 %
LYMPHS ABS: 1.1 10*3/uL (ref 0.7–4.0)
Lymphocytes Relative: 15 %
MCH: 29.8 pg (ref 26.0–34.0)
MCHC: 31.9 g/dL (ref 30.0–36.0)
MCV: 93.6 fL (ref 80.0–100.0)
Monocytes Absolute: 0.5 10*3/uL (ref 0.1–1.0)
Monocytes Relative: 8 %
NRBC: 0 % (ref 0.0–0.2)
Neutro Abs: 4.5 10*3/uL (ref 1.7–7.7)
Neutrophils Relative %: 65 %
Platelets: 287 10*3/uL (ref 150–400)
RBC: 4.56 MIL/uL (ref 3.87–5.11)
RDW: 14.7 % (ref 11.5–15.5)
WBC: 6.9 10*3/uL (ref 4.0–10.5)

## 2018-07-08 LAB — BASIC METABOLIC PANEL
Anion gap: 8 (ref 5–15)
BUN: 10 mg/dL (ref 8–23)
CALCIUM: 8.5 mg/dL — AB (ref 8.9–10.3)
CO2: 25 mmol/L (ref 22–32)
CREATININE: 0.47 mg/dL (ref 0.44–1.00)
Chloride: 104 mmol/L (ref 98–111)
Glucose, Bld: 122 mg/dL — ABNORMAL HIGH (ref 70–99)
Potassium: 4.6 mmol/L (ref 3.5–5.1)
Sodium: 137 mmol/L (ref 135–145)

## 2018-07-08 LAB — TROPONIN I
TROPONIN I: 0.04 ng/mL — AB (ref <0.03)
Troponin I: 0.05 ng/mL (ref <0.03)

## 2018-07-08 LAB — BRAIN NATRIURETIC PEPTIDE: B NATRIURETIC PEPTIDE 5: 297 pg/mL — AB (ref 0.0–100.0)

## 2018-07-08 MED ORDER — ONDANSETRON HCL 4 MG/2ML IJ SOLN
4.0000 mg | Freq: Once | INTRAMUSCULAR | Status: AC
  Administered 2018-07-08: 4 mg via INTRAVENOUS

## 2018-07-08 MED ORDER — ONDANSETRON HCL 4 MG/2ML IJ SOLN
4.0000 mg | Freq: Once | INTRAMUSCULAR | Status: AC | PRN
  Administered 2018-07-08: 4 mg via INTRAVENOUS
  Filled 2018-07-08: qty 2

## 2018-07-08 MED ORDER — ONDANSETRON HCL 4 MG/2ML IJ SOLN
INTRAMUSCULAR | Status: AC
  Administered 2018-07-08: 4 mg via INTRAVENOUS
  Filled 2018-07-08: qty 2

## 2018-07-08 MED ORDER — OXYCODONE HCL 5 MG PO TABS: 5.0000 mg | ORAL_TABLET | Freq: Three times a day (TID) | ORAL | 0 refills | Status: DC | PRN

## 2018-07-08 MED ORDER — MORPHINE SULFATE (PF) 4 MG/ML IV SOLN
4.0000 mg | Freq: Once | INTRAVENOUS | Status: AC
  Administered 2018-07-08: 4 mg via INTRAVENOUS
  Filled 2018-07-08: qty 1

## 2018-07-08 NOTE — ED Triage Notes (Signed)
Pt recently hospitalized and discharged. Pt states she hasn't felt well since she has been home but today she had a sudden onset of center chest pain that radiate to her back. Pt unsure of what time this episode was nor how long it lasted. Pt states the pain has eased off but "still hurts." Pt reports she remains nauseated.

## 2018-07-08 NOTE — ED Provider Notes (Signed)
[facility] Emergency Department Provider Note  ____________________________________________  Time seen: Approximately 2:04 PM  I have reviewed the triage vital signs and the nursing notes.   HISTORY  Chief Complaint Chest Pain   HPI Katherine Baird is a 67 y.o. female with a recent admission 6 days ago for Takotsubo cardiomyopathy who presents for evaluation of ongoing chest pain. Patient reports that she has had constant sharp chest pain radiating to her back since her last presentation to the hospital 6 days ago. Today the pain became severe and started radiating down her right arm. She has had mild shortness of breath since discharge from the hospital which is mostly with exertion. No fever or chills, no cough, no abdominal pain, nausea, vomiting. She has been taking tramadol at home which doesn't help.  Past Medical History:  Diagnosis Date  . CHF (congestive heart failure) (HCC)   . Hypertension   . Osteoporosis   . Thyroid disease     Patient Active Problem List   Diagnosis Date Noted  . NSTEMI (non-ST elevated myocardial infarction) (HCC) 07/02/2018  . Acute midline thoracic back pain 06/23/2018  . Iron deficiency anemia 02/08/2018  . Fatigue 01/13/2018  . Chronic diastolic heart failure (HCC) 12/17/2017  . 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 Syncope 02/24/2017  . Hypothyroidism 05/24/2016  . HLD (hyperlipidemia) 01/27/2015  . Paroxysmal digital cyanosis 01/27/2015  . 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 Essential (primary) hypertension 01/11/2015  . Rheumatoid arthritis involving multiple joints (HCC) 01/11/2015  . 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 LAPAROSCOPIC HYSTERECTOMY  2001   total  . LEFT HEART CATH N/A 07/03/2018   Procedure: Left Heart Cath with Coronary Angiography;  Surgeon: Marcina Millard, MD;  Location: [facility] INVASIVE CV  LAB;  Service: Cardiovascular;  Laterality: N/A;    Prior to Admission medications   Medication Sig Start Date End Date Taking? Authorizing Provider  amoxicillin-clavulanate (AUGMENTIN) 875-125 MG tablet Take 1 tablet by mouth 2 (two) times daily for 10 days. 06/30/18 07/10/18 Yes Reubin Milan, MD  carvedilol (COREG) 6.25 MG tablet Take 6.25 mg by mouth 2 (two) times daily with a meal.    Yes [provider]  clobetasol ointment (TEMOVATE) 0.05 % Apply 1 application topically 2 (two) times daily as needed (for irritation).    Yes [provider]  cyclobenzaprine (FLEXERIL) 5 MG tablet Take 5 mg by mouth 2 (two) times daily as needed for muscle spasms.  07/01/18  Yes [provider]  denosumab (PROLIA) 60 MG/ML SOSY injection Inject 60 mg into the skin every 6 (six) months.   Yes [provider]  folic acid (FOLVITE) 1 MG tablet Take 1 mg by mouth daily.   Yes [provider]  levothyroxine (SYNTHROID, LEVOTHROID) 50 MCG tablet Take 50 mcg by mouth daily before breakfast.    Yes [provider]  lisinopril (PRINIVIL,ZESTRIL) 5 MG tablet Take 1 tablet (5 mg total) by mouth daily. 07/04/18 08/03/18 Yes Sainani, Rolly Pancake, MD  ondansetron (ZOFRAN ODT) 4 MG disintegrating tablet Take 1 tablet (4 mg total) by mouth every 8 (eight) hours as needed for nausea or vomiting. 06/30/18  Yes Reubin Milan, MD  traMADol (ULTRAM) 50 MG tablet Take 1 tablet (50 mg total) by mouth every 6 (six) hours as needed for moderate pain or severe pain. 07/03/18  Yes Houston Siren, MD  oxyCODONE (ROXICODONE) 5  MG immediate release tablet Take 1 tablet (5 mg total) by mouth every 8 (eight) hours as needed. 07/08/18 07/08/19  Nita Sickle, MD    Allergies Levofloxacin; Doxycycline; Erythromycin; Gabapentin; Guaifenesin; Nitrofuran derivatives; Prednisone; Sulfa antibiotics; Tetracycline; Azithromycin; and Carvedilol  Family History  Problem Relation Age of  Onset  . Arthritis Mother   . Dementia Mother   . Heart disease Father   . Mesothelioma Father   . Kidney cancer Sister   . Heart disease Sister   . Arthritis Sister   . COPD Brother   . Arthritis Brother   . Arthritis Brother   . COPD Sister     Social History Social History   Tobacco Use  . Smoking status: Never Smoker  . Smokeless tobacco: Never Used  Substance Use Topics  . Alcohol use: No    Alcohol/week: 0.0 standard drinks  . Drug use: No    Review of Systems  Constitutional: Negative for fever. Eyes: Negative for visual changes. ENT: Negative for sore throat. Neck: No neck pain  Cardiovascular: + chest pain. Respiratory: + shortness of breath. Gastrointestinal: Negative for abdominal pain, vomiting or diarrhea. Genitourinary: Negative for dysuria. Musculoskeletal: Negative for back pain. Skin: Negative for rash. Neurological: Negative for headaches, weakness or numbness. Psych: No SI or HI  ____________________________________________   PHYSICAL EXAM:  VITAL SIGNS: ED Triage Vitals  Enc Vitals Group     BP 07/08/18 1309 (!) 155/82     Pulse Rate 07/08/18 1309 67     Resp 07/08/18 1309 19     Temp 07/08/18 1309 97.6 F (36.4 C)     Temp Source 07/08/18 1309 Oral     SpO2 07/08/18 1309 98 %     Weight 07/08/18 1313 96 lb 11.2 oz (43.9 kg)     Height 07/08/18 1313 4\' 11"  (1.499 m)     Head Circumference --      Peak Flow --      Pain Score 07/08/18 1313 8     Pain Loc --      Pain Edu? --      Excl. in GC? --     Constitutional: Alert and oriented. Well appearing and in no apparent distress. HEENT:      Head: Normocephalic and atraumatic.         Eyes: Conjunctivae are normal. Sclera is non-icteric.       Mouth/Throat: Mucous membranes are moist.       Neck: Supple with no signs of meningismus. Cardiovascular: Regular rate and rhythm. No murmurs, gallops, or rubs. 2+ symmetrical distal pulses are present in all extremities. No  JVD. Respiratory: Normal respiratory effort. Lungs are clear to auscultation bilaterally. No wheezes, crackles, or rhonchi.  Gastrointestinal: Soft, non tender, and non distended with positive bowel sounds. No rebound or guarding. Musculoskeletal: Nontender with normal range of motion in all extremities. No edema, cyanosis, or erythema of extremities. Neurologic: Normal speech and language. Face is symmetric. Moving all extremities. No gross focal neurologic deficits are appreciated. Skin: Skin is warm, dry and intact. No rash noted. Psychiatric: Mood and affect are normal. Speech and behavior are normal.  ____________________________________________   LABS (all labs ordered are listed, but only abnormal results are displayed)  Labs Reviewed  CBC WITH DIFFERENTIAL/PLATELET - Abnormal; Notable for the following components:      Result Value   Eosinophils Absolute 0.7 (*)    All other components within normal limits  BASIC METABOLIC PANEL - Abnormal; Notable for the  following components:   Glucose, Bld 122 (*)    Calcium 8.5 (*)    All other components within normal limits  TROPONIN I - Abnormal; Notable for the following components:   Troponin I 0.04 (*)    All other components within normal limits  BRAIN NATRIURETIC PEPTIDE - Abnormal; Notable for the following components:   B Natriuretic Peptide 297.0 (*)    All other components within normal limits  TROPONIN I - Abnormal; Notable for the following components:   Troponin I 0.05 (*)    All other components within normal limits   ____________________________________________  EKG  ED ECG REPORT I, Nita Sickle, the attending physician, personally viewed and interpreted this ECG.  Normal sinus rhythm, rate of 76, normal intervals, left axis deviation, T-wave inversions in lateral leads which is markedly improved when compared to prior.  ____________________________________________  RADIOLOGY  I have personally reviewed  the images performed during this visit and I agree with the Radiologist's read.   Interpretation by Radiologist:  Dg Chest 2 View  Result Date: 07/08/2018 CLINICAL DATA:  Discharge last week for chest pain. Chest pain increased today with lightheadedness and dizziness. EXAM: CHEST - 2 VIEW COMPARISON:  Chest CT, 07/02/2018.  Chest radiographs, 04/12/2014. FINDINGS: Cardiac silhouette is borderline enlarged. No mediastinal or hilar masses. No evidence of adenopathy. Clear lungs.  No pleural effusion or pneumothorax. Skeletal structures are demineralized. Old proximal right humerus fracture. IMPRESSION: No acute cardiopulmonary disease. Electronically Signed   By: Amie Portland M.D.   On: 07/08/2018 14:06     ____________________________________________   PROCEDURES  Procedure(s) performed: None Procedures Critical Care performed:  None ____________________________________________   INITIAL IMPRESSION / ASSESSMENT AND PLAN / ED COURSE  67 y.o. female with a recent admission 6 days ago for Takotsubo cardiomyopathy who presents for evaluation of ongoing chest pain. upon presentation 6 days ago patient underwent a CT angiogram of chest abdomen pelvis which showed no evidence of dissection or pulmonary embolism. She was taken to the  noCath Lab for concerns of NSTEMI and she was found to have Takotsubo cardiomyopathy and clean coronaries. She has had similar pain ongoing since leaving the hospital. At this time does not believe patient warrants any further CT imaging as this is an ongoing issue since prior admission. Her EKG is markedly improved when compared to prior. Her troponin is trending down.patient looks euvolemic with no signs of florid congestive heart failure. We'll try to control her pain with IV morphine and get a repeat troponin to ensure that that's not trending up    _________________________ 4:39 PM on 07/08/2018 -----------------------------------------  repeat troponin is  unchanged.labs no significant abnormalities. We'll give oxycodone for pain and recommended close follow-up with cardiology. Discussed return precautions for any signs of CHF, new or worsening chest pain or fever.   As part of my medical decision making, I reviewed the following data within the electronic MEDICAL RECORD NUMBER Nursing notes reviewed and incorporated, Labs reviewed , EKG interpreted , Old EKG reviewed, Old chart reviewed, Radiograph reviewed , Notes from prior ED visits and [place] Controlled Substance Database    Pertinent labs & imaging results that were available during my care of the patient were reviewed by me and considered in my medical decision making (see chart for details).    ____________________________________________   FINAL CLINICAL IMPRESSION(S) / ED DIAGNOSES  Final diagnoses:  Chest pain, unspecified type      NEW MEDICATIONS STARTED DURING THIS VISIT:  ED Discharge Orders  Ordered    oxyCODONE (ROXICODONE) 5 MG immediate release tablet  Every 8 hours PRN     07/08/18 1639           Note:  This document was prepared using Dragon voice recognition software and may include unintentional dictation errors.    Don Perking, [place], MD 07/08/18 1640

## 2018-07-08 NOTE — Telephone Encounter (Signed)
Planned attempt to contact patient for TCM call, however, pt currently admitted to ED. Will recheck tomorrow for follow up.

## 2018-07-08 NOTE — ED Notes (Signed)
Visitors at bedside state that this is her normal skin color

## 2018-07-08 NOTE — Discharge Instructions (Signed)
Pain control: Take tylenol 1000mg  every 8 hours. Take 5mg  of oxycodone every 6 hours for breakthrough pain. If you need the oxycodone make sure to take one senokot as well to prevent constipation.  Do not drink alcohol, drive or participate in any other potentially dangerous activities while taking this medication as it may make you sleepy. Do not take this medication with any other sedating medications, either prescription or over-the-counter.  return to the emergency room for shortness of breath, new or worsening chest pain. Otherwise follow-up with her cardiologist in 2 days.

## 2018-07-09 ENCOUNTER — Other Ambulatory Visit: Payer: Self-pay | Admitting: *Deleted

## 2018-07-09 DIAGNOSIS — Z9889 Other specified postprocedural states: Secondary | ICD-10-CM | POA: Diagnosis not present

## 2018-07-09 DIAGNOSIS — I10 Essential (primary) hypertension: Secondary | ICD-10-CM | POA: Diagnosis not present

## 2018-07-09 DIAGNOSIS — R0789 Other chest pain: Secondary | ICD-10-CM | POA: Diagnosis not present

## 2018-07-09 DIAGNOSIS — R7989 Other specified abnormal findings of blood chemistry: Secondary | ICD-10-CM | POA: Diagnosis not present

## 2018-07-09 NOTE — Patient Outreach (Addendum)
[facility]) Care Management  07/09/2018  Taran Hable November 27, 1950 [id number]   Transition of care by Primary care provider office  Telephone call attempt #2  Referral received : 11/8 Referral source : [facility] Care Manager Referral reason : [facility] inpatient admission , 117-11/8 NSTEMI , cardiac cath results : normal coronary anatomy , overall preserved left ventricular function ,Takosubo cardiomyopathy  Insurance : [facility] Medicare   PMHx noted : significant for Hypertension , Diastolic Heart failure, recent UTI , back pain,iron deficiency , osteoporosis,   ED visit on 11/13 Dx: unspecified chest pain.   Outreach call to patient on today, person  answering phone call identified as  [name], sister of patient and listed on patient emergency  contact, HIPAA verified x 2 identifiers. [name] states that she was with patient and she is  unable to speak on the phone at this time , states patient went to emergency room on yesterday ,and that she had been handling patient phone calls, she also asked if I was calling from [facility]. Explained reason for the call and [facility] care management services. [name] states that it would be best if I returned call in the next day or two.to speak with patient . Noted  Patient has appointment with PCP on 11/15.    Plan  Will plan return call in the next 2 business days as requested.   [name], RN, [facility] Care Management,Care Management Coordinator  [phone]- Mobile [phone]- Toll Free Main Office

## 2018-07-10 ENCOUNTER — Ambulatory Visit: Payer: Medicare Other | Admitting: Oncology

## 2018-07-10 ENCOUNTER — Inpatient Hospital Stay: Payer: Medicare Other | Admitting: Internal Medicine

## 2018-07-10 ENCOUNTER — Other Ambulatory Visit: Payer: Self-pay | Admitting: *Deleted

## 2018-07-10 ENCOUNTER — Ambulatory Visit: Payer: Medicare Other

## 2018-07-10 DIAGNOSIS — N390 Urinary tract infection, site not specified: Secondary | ICD-10-CM | POA: Diagnosis not present

## 2018-07-10 DIAGNOSIS — Z9181 History of falling: Secondary | ICD-10-CM | POA: Diagnosis not present

## 2018-07-10 DIAGNOSIS — M810 Age-related osteoporosis without current pathological fracture: Secondary | ICD-10-CM | POA: Diagnosis not present

## 2018-07-10 DIAGNOSIS — E039 Hypothyroidism, unspecified: Secondary | ICD-10-CM | POA: Diagnosis not present

## 2018-07-10 DIAGNOSIS — Z792 Long term (current) use of antibiotics: Secondary | ICD-10-CM | POA: Diagnosis not present

## 2018-07-10 DIAGNOSIS — I480 Paroxysmal atrial fibrillation: Secondary | ICD-10-CM | POA: Diagnosis not present

## 2018-07-10 DIAGNOSIS — I5032 Chronic diastolic (congestive) heart failure: Secondary | ICD-10-CM | POA: Diagnosis not present

## 2018-07-10 DIAGNOSIS — D509 Iron deficiency anemia, unspecified: Secondary | ICD-10-CM | POA: Diagnosis not present

## 2018-07-10 DIAGNOSIS — M0689 Other specified rheumatoid arthritis, multiple sites: Secondary | ICD-10-CM | POA: Diagnosis not present

## 2018-07-10 DIAGNOSIS — I110 Hypertensive heart disease with heart failure: Secondary | ICD-10-CM | POA: Diagnosis not present

## 2018-07-10 DIAGNOSIS — I214 Non-ST elevation (NSTEMI) myocardial infarction: Secondary | ICD-10-CM | POA: Diagnosis not present

## 2018-07-10 NOTE — Telephone Encounter (Signed)
Pt contacted by [facility] care management for TCM

## 2018-07-10 NOTE — Patient Outreach (Signed)
[facility]) Care Management  [facility] Care Manager  07/10/2018   Katherine Baird Nov 16, 1950 [id number]    Transition of care by [name] office   Referral received : 11/8 Referral source : [facility] Care Manager Referral reason : [facility] inpatient admission , 117-11/8 NSTEMI , cardiac cath results : normal coronary anatomy , overall preserved left ventricular function ,Takosubo cardiomyopathy  Insurance : [facility] Medicare   PMHx noted : significant for Hypertension , Diastolic Heart failure, recent UTI , back pain,iron deficiency , osteoporosis,  ED visit on 11/13 Dx: unspecified chest pain. Subjective:  Successful outreach call to patient , HIPAA verified.  Patient discussed having a rough day on yesterday, with pain in her back and chest.  She discussed visit to [facility] yesterday to make sure pain and felt being reassured afterwards.   Patient requested that I speak with her sister Katherine Baird, that is visiting from [place] and assisting patient with care and plans to return home on Saturday.   Katherine Baird discussed patient is too weak to go for office visit with [name] on today , but she plan to cancel visit and reschedule.    Patient sister discussed [facility] home health as completed initial home visit and patient will have Nurse and physical therapy. Katherine Baird has discussed with nurse patient need for bath aide, as she has been assisting her .  Patient sister discussed family plans to arrange additional support in home as patient is staying with her mother that has Alzheimer, that  has hired personal assistance until 8 pm and patient attends to her at night.   Appointments Patient has PCP post hospital discharge visit rescheduled for 11/22, family will provide transportation . Patient [facility] for [facility] discomfort on 11/27, earliest appointment   Medications  Patient usually organizes medication but since  recent admission she has needed assistance. Patient sister will fill pill organizer weekly for her. Patient denies difficulty with affording medications. Patient was recently discharged from hospital and all medications have been reviewed.    Encounter Medications:  Outpatient Encounter Medications as of 07/10/2018  Medication Sig  . amoxicillin-clavulanate (AUGMENTIN) 875-125 MG tablet Take 1 tablet by mouth 2 (two) times daily for 10 days.  . carvedilol (COREG) 6.25 MG tablet Take 6.25 mg by mouth 2 (two) times daily with a meal.   . clobetasol ointment (TEMOVATE) 0.05 % Apply 1 application topically 2 (two) times daily as needed (for irritation).   . cyclobenzaprine (FLEXERIL) 5 MG tablet Take 5 mg by mouth 2 (two) times daily as needed for muscle spasms.   Marland Kitchen denosumab (PROLIA) 60 MG/ML SOSY injection Inject 60 mg into the skin every 6 (six) months.  . folic acid (FOLVITE) 1 MG tablet Take 1 mg by mouth daily.  Marland Kitchen levothyroxine (SYNTHROID, LEVOTHROID) 50 MCG tablet Take 50 mcg by mouth daily before breakfast.   . lisinopril (PRINIVIL,ZESTRIL) 5 MG tablet Take 1 tablet (5 mg total) by mouth daily.  . ondansetron (ZOFRAN ODT) 4 MG disintegrating tablet Take 1 tablet (4 mg total) by mouth every 8 (eight) hours as needed for nausea or vomiting.  Marland Kitchen oxyCODONE (ROXICODONE) 5 MG immediate release tablet Take 1 tablet (5 mg total) by mouth every 8 (eight) hours as needed.  . traMADol (ULTRAM) 50 MG tablet Take 1 tablet (50 mg total) by mouth every 6 (six) hours as needed for moderate pain or severe pain.   No facility-administered encounter medications on file as of  07/10/2018.      Assessment  Patient agreeable to [facility] care management service to assist with care coordination and management of chronic conditions .    Plan:  Will send PCP involvement barrier letter  Will plan home visit in the next week for care planning and goal setting and further assessment of care needs.   [facility] CM Care Plan  Problem One     Most Recent Value  Care Plan Problem One  High risk for readmission related to recent hospital admission for chest pain, Takasubo cardiomyopathy   Role Documenting the Problem One  Care Management Coordinator  Care Plan for Problem One  Active  [facility] Long Term Goal   Patient will not experience a hospital admission over the next 31 days  [facility]N Long Term Goal Start Date  07/10/18  Interventions for Problem One Long Term Goal  Discussed [facility] care management follow up, taking medications as prescribed,   THN CM Short Term Goal #1   Patient will report attending all medical appointments over the next 30 days   [facility] CM Short Term Goal #1 Start Date  07/10/18  Interventions for Short Term Goal #1  Advised importance of PCP follow up after discharge for follow up on medical plan and review of medicaitons       [name], RN, [facility] Care Management,Care Management Coordinator  [phone]- Mobile [phone]- Toll Free Main Office

## 2018-07-13 DIAGNOSIS — E039 Hypothyroidism, unspecified: Secondary | ICD-10-CM | POA: Diagnosis not present

## 2018-07-13 DIAGNOSIS — N390 Urinary tract infection, site not specified: Secondary | ICD-10-CM | POA: Diagnosis not present

## 2018-07-13 DIAGNOSIS — Z792 Long term (current) use of antibiotics: Secondary | ICD-10-CM | POA: Diagnosis not present

## 2018-07-13 DIAGNOSIS — I110 Hypertensive heart disease with heart failure: Secondary | ICD-10-CM | POA: Diagnosis not present

## 2018-07-13 DIAGNOSIS — I480 Paroxysmal atrial fibrillation: Secondary | ICD-10-CM | POA: Diagnosis not present

## 2018-07-13 DIAGNOSIS — M0689 Other specified rheumatoid arthritis, multiple sites: Secondary | ICD-10-CM | POA: Diagnosis not present

## 2018-07-13 DIAGNOSIS — I214 Non-ST elevation (NSTEMI) myocardial infarction: Secondary | ICD-10-CM | POA: Diagnosis not present

## 2018-07-13 DIAGNOSIS — Z9181 History of falling: Secondary | ICD-10-CM | POA: Diagnosis not present

## 2018-07-13 DIAGNOSIS — I5032 Chronic diastolic (congestive) heart failure: Secondary | ICD-10-CM | POA: Diagnosis not present

## 2018-07-13 DIAGNOSIS — D509 Iron deficiency anemia, unspecified: Secondary | ICD-10-CM | POA: Diagnosis not present

## 2018-07-13 DIAGNOSIS — M810 Age-related osteoporosis without current pathological fracture: Secondary | ICD-10-CM | POA: Diagnosis not present

## 2018-07-14 ENCOUNTER — Other Ambulatory Visit (INDEPENDENT_AMBULATORY_CARE_PROVIDER_SITE_OTHER): Payer: Medicare Other | Admitting: Internal Medicine

## 2018-07-14 DIAGNOSIS — Z792 Long term (current) use of antibiotics: Secondary | ICD-10-CM | POA: Diagnosis not present

## 2018-07-14 DIAGNOSIS — Z9181 History of falling: Secondary | ICD-10-CM | POA: Diagnosis not present

## 2018-07-14 DIAGNOSIS — I10 Essential (primary) hypertension: Secondary | ICD-10-CM

## 2018-07-14 DIAGNOSIS — I480 Paroxysmal atrial fibrillation: Secondary | ICD-10-CM | POA: Diagnosis not present

## 2018-07-14 DIAGNOSIS — I5032 Chronic diastolic (congestive) heart failure: Secondary | ICD-10-CM

## 2018-07-14 DIAGNOSIS — M810 Age-related osteoporosis without current pathological fracture: Secondary | ICD-10-CM | POA: Diagnosis not present

## 2018-07-14 DIAGNOSIS — M0689 Other specified rheumatoid arthritis, multiple sites: Secondary | ICD-10-CM | POA: Diagnosis not present

## 2018-07-14 DIAGNOSIS — I214 Non-ST elevation (NSTEMI) myocardial infarction: Secondary | ICD-10-CM | POA: Diagnosis not present

## 2018-07-14 DIAGNOSIS — N390 Urinary tract infection, site not specified: Secondary | ICD-10-CM | POA: Diagnosis not present

## 2018-07-14 DIAGNOSIS — D509 Iron deficiency anemia, unspecified: Secondary | ICD-10-CM | POA: Diagnosis not present

## 2018-07-14 DIAGNOSIS — N3090 Cystitis, unspecified without hematuria: Secondary | ICD-10-CM | POA: Insufficient documentation

## 2018-07-14 DIAGNOSIS — M359 Systemic involvement of connective tissue, unspecified: Secondary | ICD-10-CM

## 2018-07-14 DIAGNOSIS — E039 Hypothyroidism, unspecified: Secondary | ICD-10-CM | POA: Diagnosis not present

## 2018-07-14 DIAGNOSIS — I110 Hypertensive heart disease with heart failure: Secondary | ICD-10-CM | POA: Diagnosis not present

## 2018-07-14 DIAGNOSIS — E034 Atrophy of thyroid (acquired): Secondary | ICD-10-CM

## 2018-07-14 NOTE — Progress Notes (Signed)
Received orders from [facility]. Start of care 07/07/18.  Orders through 09/04/18 are reviewed, signed and faxed.

## 2018-07-15 ENCOUNTER — Other Ambulatory Visit: Payer: Self-pay | Admitting: *Deleted

## 2018-07-15 ENCOUNTER — Encounter: Payer: Self-pay | Admitting: *Deleted

## 2018-07-15 DIAGNOSIS — Z9181 History of falling: Secondary | ICD-10-CM | POA: Diagnosis not present

## 2018-07-15 DIAGNOSIS — I214 Non-ST elevation (NSTEMI) myocardial infarction: Secondary | ICD-10-CM | POA: Diagnosis not present

## 2018-07-15 DIAGNOSIS — Z792 Long term (current) use of antibiotics: Secondary | ICD-10-CM | POA: Diagnosis not present

## 2018-07-15 DIAGNOSIS — I5032 Chronic diastolic (congestive) heart failure: Secondary | ICD-10-CM | POA: Diagnosis not present

## 2018-07-15 DIAGNOSIS — N390 Urinary tract infection, site not specified: Secondary | ICD-10-CM | POA: Diagnosis not present

## 2018-07-15 DIAGNOSIS — E039 Hypothyroidism, unspecified: Secondary | ICD-10-CM | POA: Diagnosis not present

## 2018-07-15 DIAGNOSIS — I480 Paroxysmal atrial fibrillation: Secondary | ICD-10-CM | POA: Diagnosis not present

## 2018-07-15 DIAGNOSIS — I110 Hypertensive heart disease with heart failure: Secondary | ICD-10-CM | POA: Diagnosis not present

## 2018-07-15 DIAGNOSIS — M0689 Other specified rheumatoid arthritis, multiple sites: Secondary | ICD-10-CM | POA: Diagnosis not present

## 2018-07-15 DIAGNOSIS — M810 Age-related osteoporosis without current pathological fracture: Secondary | ICD-10-CM | POA: Diagnosis not present

## 2018-07-15 DIAGNOSIS — D509 Iron deficiency anemia, unspecified: Secondary | ICD-10-CM | POA: Diagnosis not present

## 2018-07-15 NOTE — Patient Outreach (Signed)
[facility]) Care Management   07/15/2018  Katherine Baird 05/07/1951 [id number]  Katherine Baird is an 67 y.o. female   Referral received : 11/8 Referral source : [place] Referral reason : [facility] inpatient admission , 117-11/8 NSTEMI , cardiac cath results : normal coronary anatomy , overall preserved left ventricular function ,Takosubo cardiomyopathy  Insurance : [facility] Medicare   PMHx noted : significant for Hypertension , Diastolic Heart failure, recent UTI , back pain,iron deficiency , osteoporosis   Initial home visit   Subjective:   Patient discussed feeling a little better today ,chronic pain in back is a little better.  Patient discussed continued feeling weak, and back pain limits her from doing much . Patient discussed stress related to her medical conditions and care of her mother that has Alzheimer's , but her family has been staying at night to help her, otherwise her mother has a caregiver 8 am - 8 pm.   Objective:  BP 98/64 (BP Location: Right Arm, Patient Position: Sitting, Cuff Size: Normal)   Pulse 78   Resp 18   Wt 96 lb (43.5 kg)   SpO2 98%   BMI 19.39 kg/m   Patient resting in bed on arrival .  Review of Systems  Constitutional: Negative.   HENT: Negative.   Eyes: Negative.   Respiratory: Negative.   Cardiovascular: Negative.   Gastrointestinal: Positive for nausea.  Genitourinary: Negative.   Musculoskeletal: Positive for back pain.  Skin: Negative.   Neurological: Positive for weakness.  Endo/Heme/Allergies: Negative.     Physical Exam  Constitutional: She is oriented to person, place, and time. She appears well-developed and well-nourished.  Cardiovascular: Normal rate, normal heart sounds and intact distal pulses.  Respiratory: Effort normal and breath sounds normal.  GI: Soft.  Neurological: She is alert and oriented to person, place, and time.  Skin: Skin is warm and dry.  Psychiatric: She has a normal mood and  affect. Her behavior is normal. Judgment and thought content normal.    Encounter Medications:   Outpatient Encounter Medications as of 07/15/2018  Medication Sig  . acetaminophen (TYLENOL) 500 MG tablet Take 500 mg by mouth every 8 (eight) hours as needed for moderate pain.  . carvedilol (COREG) 6.25 MG tablet Take 6.25 mg by mouth 2 (two) times daily with a meal.   . clobetasol ointment (TEMOVATE) 4.09 % Apply 1 application topically 2 (two) times daily as needed (for irritation).   . cyclobenzaprine (FLEXERIL) 5 MG tablet Take 5 mg by mouth 2 (two) times daily as needed for muscle spasms.   Marland Kitchen denosumab (PROLIA) 60 MG/ML SOSY injection Inject 60 mg into the skin every 6 (six) months.  . docusate sodium (COLACE) 100 MG capsule Take 100 mg by mouth daily as needed for mild constipation. Taking one to two a day  . levothyroxine (SYNTHROID, LEVOTHROID) 50 MCG tablet Take 50 mcg by mouth daily before breakfast.   . lisinopril (PRINIVIL,ZESTRIL) 5 MG tablet Take 1 tablet (5 mg total) by mouth daily.  . ondansetron (ZOFRAN ODT) 4 MG disintegrating tablet Take 1 tablet (4 mg total) by mouth every 8 (eight) hours as needed for nausea or vomiting.  Marland Kitchen oxyCODONE (ROXICODONE) 5 MG immediate release tablet Take 1 tablet (5 mg total) by mouth every 8 (eight) hours as needed.  . traMADol (ULTRAM) 50 MG tablet Take 1 tablet (50 mg total) by mouth every 6 (six) hours as needed for moderate pain or severe pain.  Marland Kitchen  folic acid (FOLVITE) 1 MG tablet Take 1 mg by mouth daily.   No facility-administered encounter medications on file as of 07/15/2018.     Functional Status:   In your present state of health, do you have any difficulty performing the following activities: 07/15/2018 07/02/2018  Hearing? N -  Vision? N -  Difficulty concentrating or making decisions? Y -  Walking or climbing stairs? N -  Comment chronic back pain  -  Dressing or bathing? Y -  Doing errands, shopping? Y N  Preparing Food and  eating ? Y -  Comment family assist  -  Using the Toilet? N -  In the past six months, have you accidently leaked urine? N -  Do you have problems with loss of bowel control? N -  Managing your Medications? Y -  Managing your Finances? N -  Housekeeping or managing your Housekeeping? Y -  Comment family assisting -  Some recent data might be hidden    Fall/Depression Screening:    Fall Risk  07/10/2018 06/30/2018 04/06/2018  Falls in the past year? 0 1 No  Number falls in past yr: - 0 -  Injury with Fall? - 1 -  Risk for fall due to : - History of fall(s);Impaired balance/gait;Medication side effect -  Follow up - Falls evaluation completed -   PHQ 2/9 Scores 07/15/2018 04/06/2018 01/13/2018 09/19/2017 05/24/2016  PHQ - 2 Score 2 0 0 0 0  PHQ- 9 Score 9 - - - -    Assessment:  Initial home visit, met with patient at her mothers home where she is staying at present.  [facility] home health physical therapy visit today.    Chest pain/NSTEMI/Takotsubo Denies chest pain or shortness of breath. Interested in more education on Takotsubo and management .  Chronic Pain Some improvement today , controlled during visit today, plans follow up visit with Dr.Kernodle regarding arthritis discomfort.  Fall Risk High Fall risk,needs encouragement of safety precautions, use of walker and participation in physical therapy. Patient tolerating mobility to bathroom, kitchen states that she holds on to furniture. Has walker available  Requesting assistance with taking a shower, her sister has assisted her once since being home. [facility] physical  therapist discussed that occupational therapist will work with her on that and   Hypertension  Patient has  blood pressure monitor available for use. Will provide education support on benefit of home monitoring . Reports her blood pressure usually runs low . Will benefit from hypertension education . Discussed medications lisinopril and carvedilol purposes related  to medical conditions.  Social/ Positive Depression scale .  Patient reports stress related to medical conditions, death of her husband 3 years ago and care that  her mother requires. Patient is used to staying at her mother home at night,it more difficult for now since her recent hospitalization and generalized weakness.  Patient family assisting in patient care, medication organization, making sure food available , transportation and overnight care for her mother.   Patient with decreased appetite, discussed importance of nutrition to help with strength, discussed small meals, protein suggestions to include.  Appointments  [name] on 11/22 at 1:20   Plan:  [facility] consent signed, welcome packet reviewed.  Provided patient EMMI on cardiomyopathy.  [facility] calendar provided.  Provided blood pressure control book.  Will send PCP initial home visit note Will send PCP  initial [facility] note    [facility] CM Care Plan Problem One     Most Recent Value  Care Plan Problem One  High risk for readmission related to recent hospital admission for NSTEMI, Takasubo cardiomyopathy   Role Documenting the Problem One  Care Management [place] for Problem One  Active  [facility] Long Term Goal   Patient will not experience a hospital admission over the next 31 days  [facility] Long Term Goal Start Date  07/10/18  Interventions for Problem One Long Term Goal  Home visit completed, reviewed EMMI education on cardiomypathy, treatment plan review of uses of current medication, rest and balanced activity,.   [facility] CM Short Term Goal #1   Patient will report attending all medical appointments over the next 30 days   [facility] CM Short Term Goal #1 Start Date  07/10/18  Interventions for Short Term Goal #1  Reviewed upoming medical appointment , and transportation. advised importance to follow up current clinical state.   [facility] CM Short Term Goal #2   Patient will be able to report participation in daily physical therapy exercises over  the next 30 days   [facility] CM Short Term Goal #2 Start Date  07/15/18  Interventions for Short Term Goal #2  Advised regarding benefit of participation in home therapy sessions as well as exercies provided by therapist to increase strenght and help with balance   [facility] CM Short Term Goal #3  Over the next 30 days patient will be able to report monitoring blood pressure at least 3 times a week.   [facility] CM Short Term Goal #3 Start Date  07/15/18  Interventions for Short Tern Goal #3  Advised regarding importance of monitoring blood pressure in home how information helps MD with managing care, verified patient has moniitor for use, provided Blood pressure control book review of normal ranges.       Joylene Draft, RN, [place] Management Coordinator  [phone]- Mobile [phone]- Toll Free Main Office

## 2018-07-16 DIAGNOSIS — R0789 Other chest pain: Secondary | ICD-10-CM | POA: Diagnosis not present

## 2018-07-16 DIAGNOSIS — M546 Pain in thoracic spine: Secondary | ICD-10-CM | POA: Diagnosis not present

## 2018-07-16 DIAGNOSIS — M810 Age-related osteoporosis without current pathological fracture: Secondary | ICD-10-CM | POA: Diagnosis not present

## 2018-07-16 DIAGNOSIS — M40294 Other kyphosis, thoracic region: Secondary | ICD-10-CM | POA: Diagnosis not present

## 2018-07-17 ENCOUNTER — Telehealth: Payer: Self-pay

## 2018-07-17 ENCOUNTER — Ambulatory Visit: Payer: Medicare Other | Admitting: Internal Medicine

## 2018-07-17 ENCOUNTER — Encounter: Admit: 2018-07-17 | Discharge: 2018-07-18 | Disposition: A | Discharge status: Home / Self Care | Payer: MEDICARE

## 2018-07-17 ENCOUNTER — Emergency Department: Admit: 2018-07-17 | Discharge: 2018-07-18 | Disposition: A | Discharge status: Home / Self Care | Payer: MEDICARE

## 2018-07-17 DIAGNOSIS — M549 Dorsalgia, unspecified: Principal | ICD-10-CM

## 2018-07-17 DIAGNOSIS — N12 Tubulo-interstitial nephritis, not specified as acute or chronic: Secondary | ICD-10-CM | POA: Diagnosis not present

## 2018-07-17 DIAGNOSIS — M0689 Other specified rheumatoid arthritis, multiple sites: Secondary | ICD-10-CM | POA: Diagnosis not present

## 2018-07-17 DIAGNOSIS — R112 Nausea with vomiting, unspecified: Secondary | ICD-10-CM | POA: Diagnosis not present

## 2018-07-17 DIAGNOSIS — R0989 Other specified symptoms and signs involving the circulatory and respiratory systems: Secondary | ICD-10-CM | POA: Diagnosis not present

## 2018-07-17 DIAGNOSIS — I480 Paroxysmal atrial fibrillation: Secondary | ICD-10-CM | POA: Diagnosis not present

## 2018-07-17 DIAGNOSIS — M359 Systemic involvement of connective tissue, unspecified: Secondary | ICD-10-CM | POA: Diagnosis not present

## 2018-07-17 DIAGNOSIS — M546 Pain in thoracic spine: Secondary | ICD-10-CM | POA: Diagnosis not present

## 2018-07-17 DIAGNOSIS — R6883 Chills (without fever): Secondary | ICD-10-CM | POA: Diagnosis not present

## 2018-07-17 DIAGNOSIS — M1990 Unspecified osteoarthritis, unspecified site: Secondary | ICD-10-CM | POA: Diagnosis not present

## 2018-07-17 DIAGNOSIS — Z888 Allergy status to other drugs, medicaments and biological substances status: Secondary | ICD-10-CM | POA: Diagnosis not present

## 2018-07-17 DIAGNOSIS — Z8249 Family history of ischemic heart disease and other diseases of the circulatory system: Secondary | ICD-10-CM | POA: Diagnosis not present

## 2018-07-17 DIAGNOSIS — I5032 Chronic diastolic (congestive) heart failure: Secondary | ICD-10-CM | POA: Diagnosis not present

## 2018-07-17 DIAGNOSIS — Z792 Long term (current) use of antibiotics: Secondary | ICD-10-CM | POA: Diagnosis not present

## 2018-07-17 DIAGNOSIS — Z9181 History of falling: Secondary | ICD-10-CM | POA: Diagnosis not present

## 2018-07-17 DIAGNOSIS — I5181 Takotsubo syndrome: Secondary | ICD-10-CM | POA: Diagnosis not present

## 2018-07-17 DIAGNOSIS — E039 Hypothyroidism, unspecified: Secondary | ICD-10-CM | POA: Diagnosis not present

## 2018-07-17 DIAGNOSIS — M4850XA Collapsed vertebra, not elsewhere classified, site unspecified, initial encounter for fracture: Secondary | ICD-10-CM | POA: Diagnosis not present

## 2018-07-17 DIAGNOSIS — R9431 Abnormal electrocardiogram [ECG] [EKG]: Secondary | ICD-10-CM | POA: Diagnosis not present

## 2018-07-17 DIAGNOSIS — I110 Hypertensive heart disease with heart failure: Secondary | ICD-10-CM | POA: Diagnosis not present

## 2018-07-17 DIAGNOSIS — Z882 Allergy status to sulfonamides status: Secondary | ICD-10-CM | POA: Diagnosis not present

## 2018-07-17 DIAGNOSIS — R079 Chest pain, unspecified: Secondary | ICD-10-CM | POA: Diagnosis not present

## 2018-07-17 DIAGNOSIS — I252 Old myocardial infarction: Secondary | ICD-10-CM | POA: Diagnosis not present

## 2018-07-17 DIAGNOSIS — I214 Non-ST elevation (NSTEMI) myocardial infarction: Secondary | ICD-10-CM | POA: Diagnosis not present

## 2018-07-17 DIAGNOSIS — Z79899 Other long term (current) drug therapy: Secondary | ICD-10-CM | POA: Diagnosis not present

## 2018-07-17 DIAGNOSIS — R918 Other nonspecific abnormal finding of lung field: Secondary | ICD-10-CM | POA: Diagnosis not present

## 2018-07-17 DIAGNOSIS — M810 Age-related osteoporosis without current pathological fracture: Secondary | ICD-10-CM | POA: Diagnosis not present

## 2018-07-17 DIAGNOSIS — N179 Acute kidney failure, unspecified: Secondary | ICD-10-CM | POA: Diagnosis not present

## 2018-07-17 DIAGNOSIS — N390 Urinary tract infection, site not specified: Secondary | ICD-10-CM | POA: Diagnosis not present

## 2018-07-17 DIAGNOSIS — M40204 Unspecified kyphosis, thoracic region: Secondary | ICD-10-CM | POA: Diagnosis not present

## 2018-07-17 DIAGNOSIS — D509 Iron deficiency anemia, unspecified: Secondary | ICD-10-CM | POA: Diagnosis not present

## 2018-07-17 LAB — BASIC METABOLIC PANEL
ANION GAP: 1 mmol/L — ABNORMAL LOW (ref 7–15)
BLOOD UREA NITROGEN: 52 mg/dL — ABNORMAL HIGH (ref 7–21)
BUN / CREAT RATIO: 54
CHLORIDE: 119 mmol/L — ABNORMAL HIGH (ref 98–107)
CO2: 19 mmol/L — ABNORMAL LOW (ref 22.0–30.0)
CREATININE: 0.96 mg/dL (ref 0.60–1.00)
EGFR CKD-EPI AA FEMALE: 71 mL/min/{1.73_m2} (ref >=60)
EGFR CKD-EPI NON-AA FEMALE: 61 mL/min/{1.73_m2} (ref >=60)
GLUCOSE RANDOM: 82 mg/dL (ref 65–179)
POTASSIUM: 4.3 mmol/L (ref 3.5–5.0)

## 2018-07-17 LAB — CBC W/ AUTO DIFF
BASOPHILS ABSOLUTE COUNT: 0.1 10*9/L (ref 0.0–0.1)
BASOPHILS RELATIVE PERCENT: 2.2 %
EOSINOPHILS ABSOLUTE COUNT: 0.4 10*9/L (ref 0.0–0.4)
EOSINOPHILS RELATIVE PERCENT: 7.8 %
HEMATOCRIT: 47 % — ABNORMAL HIGH (ref 36.0–46.0)
HEMOGLOBIN: 15.4 g/dL (ref 13.5–16.0)
LARGE UNSTAINED CELLS: 2 % (ref 0–4)
LYMPHOCYTES ABSOLUTE COUNT: 0.8 10*9/L — ABNORMAL LOW (ref 1.5–5.0)
MEAN CORPUSCULAR HEMOGLOBIN CONC: 32.7 g/dL (ref 31.0–37.0)
MEAN CORPUSCULAR HEMOGLOBIN: 30.5 pg (ref 26.0–34.0)
MEAN CORPUSCULAR VOLUME: 93.3 fL (ref 80.0–100.0)
MEAN PLATELET VOLUME: 7.6 fL (ref 7.0–10.0)
MONOCYTES ABSOLUTE COUNT: 0.3 10*9/L (ref 0.2–0.8)
MONOCYTES RELATIVE PERCENT: 5.5 %
NEUTROPHILS ABSOLUTE COUNT: 3.3 10*9/L (ref 2.0–7.5)
NEUTROPHILS RELATIVE PERCENT: 66.3 %
PLATELET COUNT: 354 10*9/L (ref 150–440)
RED BLOOD CELL COUNT: 5.04 10*12/L (ref 4.00–5.20)
WBC ADJUSTED: 4.9 10*9/L (ref 4.5–11.0)

## 2018-07-17 LAB — URINALYSIS WITH CULTURE REFLEX
BILIRUBIN UA: NEGATIVE
BLOOD UA: NEGATIVE
KETONES UA: NEGATIVE
NITRITE UA: NEGATIVE
PROTEIN UA: NEGATIVE
SPECIFIC GRAVITY UA: 1.02 (ref 1.005–1.040)
SQUAMOUS EPITHELIAL: 10 /HPF — ABNORMAL HIGH (ref 0–5)
UROBILINOGEN UA: 0.2
WBC UA: 60 /HPF — ABNORMAL HIGH (ref 0–5)

## 2018-07-17 LAB — COMPREHENSIVE METABOLIC PANEL
ALBUMIN: 4.7 g/dL (ref 3.5–5.0)
ALKALINE PHOSPHATASE: 56 U/L (ref 38–126)
ANION GAP: 9 mmol/L (ref 7–15)
BILIRUBIN TOTAL: 0.7 mg/dL (ref 0.0–1.2)
BLOOD UREA NITROGEN: 66 mg/dL — ABNORMAL HIGH (ref 7–21)
BUN / CREAT RATIO: 45
CALCIUM: 9.4 mg/dL (ref 8.5–10.2)
CHLORIDE: 109 mmol/L — ABNORMAL HIGH (ref 98–107)
CO2: 20 mmol/L — ABNORMAL LOW (ref 22.0–30.0)
CREATININE: 1.48 mg/dL — ABNORMAL HIGH (ref 0.60–1.00)
EGFR CKD-EPI NON-AA FEMALE: 36 mL/min/{1.73_m2} — ABNORMAL LOW (ref >=60)
GLUCOSE RANDOM: 107 mg/dL (ref 65–179)
POTASSIUM: 4.5 mmol/L (ref 3.5–5.0)
PROTEIN TOTAL: 8.1 g/dL (ref 6.5–8.3)
SODIUM: 138 mmol/L (ref 135–145)

## 2018-07-17 LAB — ALBUMIN: Albumin:MCnc:Pt:Ser/Plas:Qn:: 4.7

## 2018-07-17 LAB — NEUTROPHILS RELATIVE PERCENT: Lab: 66.3

## 2018-07-17 LAB — GLUCOSE RANDOM: Glucose:MCnc:Pt:Ser/Plas:Qn:: 82

## 2018-07-17 LAB — PRO-BNP: Natriuretic peptide.B prohormone N-Terminal:MCnc:Pt:Ser/Plas:Qn:: 135

## 2018-07-17 LAB — TROPONIN I: Troponin I.cardiac:MCnc:Pt:Ser/Plas:Qn:: <0.06

## 2018-07-17 LAB — PROTEIN UA: Protein:MCnc:Pt:Urine:Qn:Test strip: NEGATIVE

## 2018-07-17 MED ORDER — PROMETHAZINE 12.5 MG TABLET
ORAL_TABLET | Freq: Four times a day (QID) | ORAL | 0 refills | 0.00000 days | Status: CP | PRN
Start: 2018-07-17 — End: 2018-08-04

## 2018-07-17 MED ORDER — LIDOCAINE 5 % TOPICAL PATCH
MEDICATED_PATCH | TRANSDERMAL | 0 refills | 0 days | Status: CP
Start: 2018-07-17 — End: 2018-08-16

## 2018-07-17 MED ORDER — TRAMADOL 50 MG TABLET
ORAL_TABLET | Freq: Four times a day (QID) | ORAL | 0 refills | 0 days | Status: SS | PRN
Start: 2018-07-17 — End: 2018-08-04

## 2018-07-17 MED ORDER — CEPHALEXIN 500 MG CAPSULE
ORAL_CAPSULE | Freq: Four times a day (QID) | ORAL | 0 refills | 0 days | Status: CP
Start: 2018-07-17 — End: 2018-08-04

## 2018-07-17 NOTE — Unmapped (Signed)
[facility] Emergency Department Provider Note    ED Clinical Impression     Final diagnoses:   Pyelonephritis (Primary)   AKI (acute kidney injury) (CMS-HCC)   Back pain, unspecified back location, unspecified back pain laterality, unspecified chronicity   Compression deformity of vertebra       Initial Impression, ED Course, Assessment and Plan     67 y.o. female with a PMH of chronic back pain, hypothyroidism, OA, and recent hx of NSTEMI on 11/7, dx with Takasubo cardiomyopathy s/p normal cath, presenting to the ED for 3 weeks of progressively worsening back pain. On exam, patient is nontoxic appearing and in NAD. VS unremarkable. Heart rate and rhythm normal. Crackles in right lower lung. Abdomen soft and nondistended. Left paraspinal thoracic and supscapular tenderness Right paraspinal thoracic and subscapular tenderness. No midline spinal tenderness. NV intact to extremities. Exam otherwise unremarkable.    Given pt. With extensive cardiac work-up recently do not suspect a new cardiac event, consider continued chest pain 2/2 Takasubo cardiomyopathy. Pt. Today with complaints primarily of back pain as opposed to chest pain. Review of records reveals recent UTI, will repeat UA as pt. Reports she briefly took PO abx outpatient prior to hospital stay and is unclear if she continued to receive abx in the hospital and does continue to have urinary s/s. Given hx of OA with pathological fx, I am most suspicious of a T-spine compression fx. Will obtain CT T-spine as she is NV intact and I do not think an MRI from the ED is clinically indicated.     Plan for basic labs, troponin, pro-BNP, UA, EKG, chest XR, and T-spine CT. Will give morphine for pain control and reassess.    4:04 PM  Chest XR clear. Troponin negative. CMP with acute AKI- creatinine 1.48 (up from 0.47 on 11/13), otherwise unremarkable. CBC with no leukocytosis, no anemia, 1+ left shift. Will give fluids and recheck CMP.    5:10 PM  T-spine CT with impression:  --Age-indeterminate, mild compression deformity of the superior endplate of T4, likely nonacute.  --No convincing evidence of acute skeletal injury.  UA with 60 WBC, moderate leuk esterase, and rare bacteria. Will treat for pyelonephritis with rocephin. Plan to send home with Keflex x 10 days.      7:09 PM  Pro-BNP WNL at 135. Pending repeat BMP after second L fluids.    8:42 PM  Creatinine improved to 0.96 after fluids. Plan to discharge patient home with a prescription for Keflex, phenergan, and tramadol, lidocaine patches, and referral to [facility]. I advised PCP follow-up in the next 2-3 days. Discussed my evaluation of the patient's symptoms, my clinical impression, and my proposed outpatient treatment plan with patient. We have discussed anticipatory guidance, scheduled follow-up, and careful return precautions. Patient expresses understanding, is agreeable and comfortable with the discharge plan. Denies any questions or concerns at this time. NAD noted upon discharge.       Labs     Labs Reviewed   COMPREHENSIVE METABOLIC PANEL - Abnormal; Notable for the following components:       Result Value    Chloride 109 (*)     CO2 20.0 (*)     BUN 66 (*)     Creatinine 1.48 (*)     EGFR CKD-EPI Non-African American, Female 68 (*)     EGFR CKD-EPI African American, Female 31 (*)     All other components within normal limits   URINALYSIS WITH CULTURE REFLEX - Abnormal;  Notable for the following components:    Leukocyte Esterase, UA Moderate (*)     WBC, UA 60 (*)     Squam Epithel, UA 10 (*)     Bacteria, UA Rare (*)     All other components within normal limits   BASIC METABOLIC PANEL - Abnormal; Notable for the following components:    Chloride 119 (*)     CO2 19.0 (*)     Anion Gap 1 (*)     BUN 52 (*)     Calcium 7.5 (*)     All other components within normal limits   CBC W/ AUTO DIFF - Abnormal; Notable for the following components:    HCT 47.0 (*)     RDW 15.7 (*)     Neutrophil Left Shift 1+ (*)     Absolute Lymphocytes 0.8 (*)     All other components within normal limits    Narrative:     Please use the Absolute Differential for reference ranges.    TROPONIN I - Normal   PRO-BNP - Normal   URINE CULTURE   CBC W/ DIFFERENTIAL    Narrative:     The following orders were created for panel order CBC w/ Differential.  Procedure                               Abnormality         Status                     ---------                               -----------         ------                     CBC w/ Differential[[id number]]         Abnormal            Final result                 Please view results for these tests on the individual orders.       Radiology     CT Thoracic spine WO contrast   Final Result   Age-indeterminate, mild compression deformity of the superior endplate of T4, likely nonacute.      No convincing evidence of acute skeletal injury.      XR Chest 2 views   Final Result      No acute airspace disease.          History     Chief Complaint  Back Pain      HPI   Patient was seen by me at 3:05 PM.    Patient is a 67 y.o. female with a PMH of chronic back pain, hypothyroidism, and recent hx of NSTEMI on 11/7 presenting to the ED for 3 weeks of progressively worsening back pain. Patient reports that she has had progressively worsening thoracic back pain that spreads over her entire back over the past 3 weeks. She states that the pain started between her shoulder blades and has intermittently extended down her arms, right greater than left. Patient describes her back pain as a deep, aching pain that becomes intermittently sharp. She states that movement worsens the pain. She reports that she has taken tramadol, tylenol, and  ibuprofen with minor relief of her pain. Patient states that she has also had nausea and vomiting, last episode of vomiting last night. She states that she believes her vomiting is 2/2 pain. She reports that she has been taking Zofran without relief of her nausea. Endorses chest pain, chills, dysuria, and decreased appetite. Endorses recent weight loss. Denies recent falls or injuries. Denies diaphoresis, shortness of breath, fevers, focal weakness or numbness, bladder or bowel incontinence, saddle anesthesia, jaw pain, abdominal pain, diarrhea, cough, or other concerning symptoms. Patient reports that she has an appointment scheduled with her Internal Medicine physician on 11/25.    Per chart review, patient was seen on 11/7 at [facility] with EKG that showed inferior ischemia. She was admitted and started on IV drip heparin and aspirin. Patient went to cath lab which did not show any blockage but did show apical ballooning c/w Takasubo cardiomyopathy. Echocardiogram with normal EF with akinesis of the apical myocardium c/w Takasubo cardiomyopathy. She was started on low-dose lisinopril and advised to continue home dose of carvedilol. Pt. F/u with Cardiology on 07/09/18 with recommendations to continue anti-hypertensives. Patient was also recently found to have a UTI at a visit to [facility] on 11/1 and prescribed Keflex.    CTA on 07/02/18 at [facility] with impression:  --No evidence of thoracic or abdominal aortic dissection or aneurysm.  --The mesenteric and renal arteries are widely patent without  significant stenosis.  --2 mm nodule is noted in left lower lobe. No follow-up needed if  patient is low-risk. Non-contrast chest CT can be considered in 12  months if patient is high-risk. This recommendation follows the  consensus statement: Guidelines for Management of Incidental  Pulmonary Nodules Detected on CT Images: From the Fleischner Society  2017; Radiology 2017; 284:228-243.  --No acute abnormality is noted in the abdomen or pelvis.    Also of note, f/u with Rheumatology on 07/16/18, advised lidoderm patched for pain, repeat T spine films, and consider MR of T spine to r/o compression fx.    Previous chart, nursing notes, and vital signs reviewed.      Pertinent labs & imaging results that were available during my care of the patient were reviewed by me and considered in my medical decision making (see chart for details).    Past Medical History:   Diagnosis Date   ??? Arthritis     likely osteoarthritis    ??? Back pain    ??? Bladder spasm    ??? Disease of thyroid gland    ??? 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 Painful urination    ??? Undifferentiated connective tissue disease (CMS-HCC)    ??? Vaginal discharge    ??? 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 Laparoscopic Bilateral oophrectomy for torsion     ??? SKIN BIOPSY     ??? TOTAL ABDOMINAL HYSTERECTOMY      Menorrhagia       No current facility-administered medications for this encounter.     Current Outpatient Medications:   ???  cyclobenzaprine (FLEXERIL) 5 MG tablet, Take 5 mg by mouth., Disp: , Rfl:   ???  lisinopril (PRINIVIL,ZESTRIL) 5 MG tablet, Take 5 mg by mouth., Disp: , Rfl:   ???  carvedilol (COREG) 6.25 MG tablet, Take 6.25 mg by mouth Two (2) times a day. , Disp: , Rfl:   ???  cephalexin (KEFLEX) 500 MG capsule, Take 1 tablet  4x/day for 7 days to treat skin infection, Disp: 28 capsule, Rfl: 0  ???  cephalexin (KEFLEX) 500 MG capsule, Take 1 capsule (500 mg total) by mouth Four (4) times a day. for 10 days, Disp: 20 capsule, Rfl: 0  ???  cetirizine (ZYRTEC) 10 MG tablet, Take one tablet by mouth twice daily, Disp: 60 tablet, Rfl: 6  ???  clobetasol (TEMOVATE) 0.05 % ointment, Apply to hands twice daily as needed, Disp: 120 g, Rfl: 5  ???  diphenhydrAMINE (BENADRYL) 25 mg capsule, Take 25 mg by mouth every six (6) hours as needed. As needed, Disp: , Rfl:   ???  dupilumab (DUPIXENT) 300 mg/2 mL Syrg injection, INJECT THE CONTENTS OF 2 SYRINGES (600MG ) SUBCUTANEOUSLY AT WEEK 0 THEN INJECT THE CONTENTS OF 1 SYRINGE (300MG ) EVERY 2 WEEKS, Disp: 4 mL, Rfl: 12  ???  leucovorin (WELLCOVORIN) 5 mg tablet, Take one tablet the day after taking methotrexate, Disp: 15 tablet, Rfl: 3  ???  levothyroxine (SYNTHROID, LEVOTHROID) 50 MCG tablet, , Disp: , Rfl:   ???  lidocaine (LIDODERM) 5 % patch, Place 1 patch on the skin daily. Apply to affected area for 12 hours only each day (then remove patch), Disp: 30 patch, Rfl: 0  ???  methotrexate 2.5 MG tablet, Take 2  tablets by mouth weekly, Disp: 10 tablet, Rfl: 1  ???  ondansetron (ZOFRAN) 4 MG tablet, , Disp: , Rfl:   ???  promethazine (PHENERGAN) 12.5 MG tablet, Take 1 tablet (12.5 mg total) by mouth every six (6) hours as needed for nausea. for up to 7 days, Disp: 20 tablet, Rfl: 0  ???  pseudoephedrine (SUDAFED 12 HOUR) 120 mg 12 hr tablet, Take 120 mg by mouth daily at 0600. , Disp: , Rfl:   ???  traMADol (ULTRAM) 50 mg tablet, Take 50 mg by mouth nightly as needed. , Disp: , Rfl:   ???  traMADol (ULTRAM) 50 mg tablet, Take 1 tablet (50 mg total) by mouth every six (6) hours as needed for pain. for up to 5 days, Disp: 20 tablet, Rfl: 0  ???  triamcinolone (KENALOG) 0.1 % cream, Apply to affected areas 2x/day until improved, Disp: 454 g, Rfl: 3    Allergies  Carvedilol; Levofloxacin; Prednisone; Valtrex [valacyclovir]; Nitrofurantoin; Sulfacetamide sodium; Azithromycin; Doxycycline; Erythromycin; Guaifenesin; Nitrofurantoin macrocrystalline; Sulfa (sulfonamide antibiotics); and Tetracycline    Family History   Problem Relation Age of Onset   ??? Heart failure Mother    ??? Kidney cancer Sister    ??? Heart failure Maternal Grandmother    ??? Dementia Maternal Grandmother    ??? Heart failure Paternal Grandmother    ??? Colon cancer Maternal Aunt    ??? Arthritis Neg Hx    ??? Melanoma Neg Hx    ??? Basal cell carcinoma Neg Hx    ??? Squamous cell carcinoma Neg Hx        Social History  Social History     Tobacco Use   ??? Smoking status: Never Smoker   ??? Smokeless tobacco: Never Used   Substance Use Topics   ??? Alcohol use: No   ??? Drug use: No       Review of Systems    Constitutional: Positive for chills and wt. loss. Negative for fever.  Eyes: Negative for visual changes, erythema, drainage.  Cardiovascular: Positive for chest pain. Negative for palpitations.  Respiratory: Negative for shortness of breath or cough.  Gastrointestinal: Positive for nausea and vomiting. Negative for abdominal pain, constipation or  diarrhea.  Genitourinary: Positive for dysuria. Negative for urgency, frequency, hesitancy, hematuria. Negative for vaginal discharge or bleeding.  Musculoskeletal: Positive for back pain. Negative for neck pain. Negative for joint pain or swelling.  Skin: Negative for rash. Negative for pallor. Negative for diaphoresis.  Neurological: Negative for headaches, weakness, AMS, LOC, dizziness, or numbness.      Physical Exam     VITAL SIGNS:    Vitals:    07/17/18 1358 07/17/18 1450 07/17/18 1705   BP: 144/78 154/86 122/75   Pulse: 79 79 77   Resp: 20 16 19    Temp: 36.4 ??C (97.5 ??F)     TempSrc: Oral     SpO2: 94%     Weight: 42.2 kg (93 lb)     Height: 149.9 cm (4' 11)           Constitutional: Alert and oriented. Well appearing and in no distress.  Eyes: Conjunctivae are normal. PERRLA. EOMs intact.   ENT       Head: Normocephalic and atraumatic.       Nose: No congestion. No epistaxis       Mouth/Throat: Mucous membranes are moist without lesions/ulcerations.       Neck: No stridor. Thyroid without nodules. Full ROM without pain. No spinous process tenderness. No nuchal rigidity.  Hematological/Lymphatic/Immunilogical: No cervical lymphadenopathy.  Cardiovascular: Normal rate, regular rhythm. Normal and symmetric distal pulses are present in all extremities. No gallops, murmurs, or clicks.  Respiratory: Crackles in right lower lung. Normal respiratory effort.  Gastrointestinal: Soft and nontender. BS active. No guarding or rigidity.  Musculoskeletal: Left paraspinal thoracic and supscapular tenderness. Right paraspinal thoracic and subscapular tenderness. No midline spinal tenderness. Nontender joints and muscles with normal range of motion in all extremities.  Neurologic: Normal speech and language. Cranial nerves II-XII grossly intact. 5 out of 5 strength in finger grip, biceps, triceps, deltoids bilaterally. 5 out of 5 strength with hip flexion, knee flexion and extension, ankle dorsiflexion, and ankle plantar flexion. Normal sensation in upper extremities and lower extremities bilaterally. Normal tandem gait.  Skin: Skin is warm, dry and intact. No rash noted. No pallor.  Psychiatric: Mood and affect are normal. Speech and behavior are normal.     EKG     Encounter Date: 07/17/18   ECG 12 Lead   Result Value    EKG Systolic BP     EKG Diastolic BP     EKG Ventricular Rate 70    EKG Atrial Rate 70    EKG P-R Interval 180    EKG QRS Duration 104    EKG Q-T Interval 478    EKG QTC Calculation 516    EKG Calculated P Axis 68    EKG Calculated R Axis -32    EKG Calculated T Axis -162    QTC Fredericia 503    Narrative    NORMAL SINUS RHYTHM  LEFT AXIS DEVIATION  MARKED T WAVE ABNORMALITY, CONSIDER ANTEROLATERAL ISCHEMIA  PROLONGED QT  ABNORMAL ECG  WHEN COMPARED WITH ECG OF 17-Jul-2018 14:11,  NO SIGNIFICANT CHANGE WAS FOUND       Documentation assistance was provided by Pamala Duffel, Scribe, on July 17, 2018 at 3:06 PM for [employer], AGNP.    July 17, 2018 7:17 AM. Documentation assistance provided by the scribe. I was present during the time the encounter was recorded. The information recorded by the scribe was done at my direction and has been reviewed and validated by me.  [address] [place], [place]  07/19/18 [phone]

## 2018-07-17 NOTE — Unmapped (Signed)
Pt c/o thoracic pain x 3 weeks that radiates to her whole back.  Also reports n/v.  Denies SOB or diaphoresis.  Was seen at [facility].  Was treated for UTI and went to the cath lab and didn't find any blockage.

## 2018-07-17 NOTE — Telephone Encounter (Signed)
[facility] called stating patient is at a 10 pain level. Not able to eat and constipated. Advised ER since already on narcotics and we would not be able to treat pain. Will go today. Cancelled Hosp FU

## 2018-07-18 NOTE — Unmapped (Signed)
Pt given crackers and water for PO challenge

## 2018-07-19 ENCOUNTER — Encounter
Admit: 2018-07-19 | Discharge: 2018-07-19 | Disposition: A | Discharge status: Home / Self Care | Payer: MEDICARE | Attending: Family

## 2018-07-19 DIAGNOSIS — R1010 Upper abdominal pain, unspecified: Principal | ICD-10-CM

## 2018-07-19 DIAGNOSIS — K828 Other specified diseases of gallbladder: Secondary | ICD-10-CM | POA: Diagnosis not present

## 2018-07-19 DIAGNOSIS — I517 Cardiomegaly: Secondary | ICD-10-CM | POA: Diagnosis not present

## 2018-07-19 DIAGNOSIS — K3189 Other diseases of stomach and duodenum: Secondary | ICD-10-CM | POA: Diagnosis not present

## 2018-07-19 DIAGNOSIS — I4581 Long QT syndrome: Secondary | ICD-10-CM | POA: Diagnosis not present

## 2018-07-19 DIAGNOSIS — M1990 Unspecified osteoarthritis, unspecified site: Secondary | ICD-10-CM | POA: Diagnosis not present

## 2018-07-19 DIAGNOSIS — R112 Nausea with vomiting, unspecified: Secondary | ICD-10-CM | POA: Diagnosis not present

## 2018-07-19 DIAGNOSIS — I214 Non-ST elevation (NSTEMI) myocardial infarction: Secondary | ICD-10-CM | POA: Diagnosis not present

## 2018-07-19 DIAGNOSIS — K319 Disease of stomach and duodenum, unspecified: Secondary | ICD-10-CM | POA: Diagnosis not present

## 2018-07-19 DIAGNOSIS — R300 Dysuria: Secondary | ICD-10-CM | POA: Diagnosis not present

## 2018-07-19 DIAGNOSIS — G8929 Other chronic pain: Secondary | ICD-10-CM | POA: Diagnosis not present

## 2018-07-19 DIAGNOSIS — R9431 Abnormal electrocardiogram [ECG] [EKG]: Secondary | ICD-10-CM | POA: Diagnosis not present

## 2018-07-19 DIAGNOSIS — K5900 Constipation, unspecified: Secondary | ICD-10-CM | POA: Diagnosis not present

## 2018-07-19 DIAGNOSIS — M359 Systemic involvement of connective tissue, unspecified: Secondary | ICD-10-CM | POA: Diagnosis not present

## 2018-07-19 DIAGNOSIS — R309 Painful micturition, unspecified: Secondary | ICD-10-CM | POA: Diagnosis not present

## 2018-07-19 DIAGNOSIS — M549 Dorsalgia, unspecified: Secondary | ICD-10-CM | POA: Diagnosis not present

## 2018-07-19 DIAGNOSIS — K821 Hydrops of gallbladder: Secondary | ICD-10-CM | POA: Diagnosis not present

## 2018-07-19 DIAGNOSIS — E039 Hypothyroidism, unspecified: Secondary | ICD-10-CM | POA: Diagnosis not present

## 2018-07-19 LAB — URINALYSIS WITH CULTURE REFLEX
BACTERIA: NONE SEEN /HPF
GLUCOSE UA: NEGATIVE
GRANULAR CASTS: <1 /LPF — ABNORMAL HIGH
HYALINE CASTS: 2 /LPF — ABNORMAL HIGH (ref <=0)
NITRITE UA: NEGATIVE
PH UA: 6 (ref 5.0–9.0)
RBC UA: 11 /HPF — ABNORMAL HIGH (ref <4)
SPECIFIC GRAVITY UA: 1.01 (ref 1.005–1.040)
UROBILINOGEN UA: 0.2
WBC UA: 14 /HPF — ABNORMAL HIGH (ref 0–5)

## 2018-07-19 LAB — COMPREHENSIVE METABOLIC PANEL
ALBUMIN: 4.3 g/dL (ref 3.5–5.0)
ALKALINE PHOSPHATASE: 57 U/L (ref 38–126)
ALT (SGPT): 12 U/L (ref <35)
ANION GAP: 6 mmol/L — ABNORMAL LOW (ref 7–15)
AST (SGOT): 26 U/L (ref 14–38)
BILIRUBIN TOTAL: 0.7 mg/dL (ref 0.0–1.2)
BLOOD UREA NITROGEN: 18 mg/dL (ref 7–21)
BUN / CREAT RATIO: 25
CHLORIDE: 111 mmol/L — ABNORMAL HIGH (ref 98–107)
CO2: 23 mmol/L (ref 22.0–30.0)
EGFR CKD-EPI AA FEMALE: >90 mL/min/{1.73_m2} (ref >=60)
EGFR CKD-EPI NON-AA FEMALE: 85 mL/min/{1.73_m2} (ref >=60)
GLUCOSE RANDOM: 125 mg/dL (ref 65–179)
POTASSIUM: 4 mmol/L (ref 3.5–5.0)
PROTEIN TOTAL: 7.5 g/dL (ref 6.5–8.3)
SODIUM: 140 mmol/L (ref 135–145)

## 2018-07-19 LAB — CBC W/ AUTO DIFF
BASOPHILS ABSOLUTE COUNT: 0.1 10*9/L (ref 0.0–0.1)
BASOPHILS RELATIVE PERCENT: 1.4 %
EOSINOPHILS ABSOLUTE COUNT: 0.3 10*9/L (ref 0.0–0.4)
EOSINOPHILS RELATIVE PERCENT: 4.8 %
HEMATOCRIT: 46.1 % — ABNORMAL HIGH (ref 36.0–46.0)
HEMOGLOBIN: 15.2 g/dL (ref 13.5–16.0)
LARGE UNSTAINED CELLS: 1 % (ref 0–4)
LYMPHOCYTES ABSOLUTE COUNT: 0.9 10*9/L — ABNORMAL LOW (ref 1.5–5.0)
LYMPHOCYTES RELATIVE PERCENT: 14 %
MEAN CORPUSCULAR HEMOGLOBIN CONC: 32.9 g/dL (ref 31.0–37.0)
MEAN CORPUSCULAR HEMOGLOBIN: 30.5 pg (ref 26.0–34.0)
MEAN CORPUSCULAR VOLUME: 92.7 fL (ref 80.0–100.0)
MEAN PLATELET VOLUME: 7.7 fL (ref 7.0–10.0)
MONOCYTES ABSOLUTE COUNT: 0.3 10*9/L (ref 0.2–0.8)
MONOCYTES RELATIVE PERCENT: 5 %
NEUTROPHILS ABSOLUTE COUNT: 4.5 10*9/L (ref 2.0–7.5)
NEUTROPHILS RELATIVE PERCENT: 73.6 %
WBC ADJUSTED: 6.1 10*9/L (ref 4.5–11.0)

## 2018-07-19 LAB — ALKALINE PHOSPHATASE: Alkaline phosphatase:CCnc:Pt:Ser/Plas:Qn:: 57

## 2018-07-19 LAB — TROPONIN I: Troponin I.cardiac:MCnc:Pt:Ser/Plas:Qn:: <0.06

## 2018-07-19 LAB — WBC UA: Lab: 14 — ABNORMAL HIGH

## 2018-07-19 LAB — LIPASE: Triacylglycerol lipase:CCnc:Pt:Ser/Plas:Qn:: 259 — ABNORMAL HIGH

## 2018-07-19 LAB — NEUTROPHILS ABSOLUTE COUNT: Lab: 4.5

## 2018-07-19 MED ORDER — SENNOSIDES 8.6 MG-DOCUSATE SODIUM 50 MG TABLET
ORAL_TABLET | Freq: Every day | ORAL | 0 refills | 0 days | Status: CP
Start: 2018-07-19 — End: 2018-08-08

## 2018-07-19 NOTE — Unmapped (Signed)
Pt with 3 week hx of upper abdominal pain with associated nausea and vomiting . Also has had some constipation. Is on nausea meds.

## 2018-07-19 NOTE — Unmapped (Signed)
Emergency Department Provider Note    ED Clinical Impression     Final diagnoses:   Abdominal pain, unspecified abdominal location (Primary)   Constipation, unspecified constipation type     ED Assessment/Plan     Patient is a 67 y.o. female with PMH of chronic back pain, hypothyroidism, OA, and recent hx of NSTEMI on 11/7, dx with Takasubo cardiomyopathy s/p normal cath presenting for 3 weeks of diffuse upper abdominal pain that radiates to her back bilaterally, with associated vomiting, dysuria, and constipation.     On exam, patient is chronically ill and frail appearing, but in NAD. She had dry mucus membranes. Abdomen soft and nontender.     Plan for CT A/P, EKG, basic labs, troponin, and lipase. Will give IVF and benadryl.     12:06 AM   CT A/P shows hydropic gallbladder and distended duodenum. Will obtain gallbladder US for further evaluation. Lipase elevated to 259. Troponin negative. CBC and CMP otherwise unremarkable.      4:02 PM   US abdomen shows hydropic gallbladder without cholelithiasis. UA significant for possible UTI with trace leuks, protein, ketones, blood, and WBC.     Plan to discharge home with pericolace and recommendation for MiraLax due to constipation along with PCP follow up. Strict return precautions given. Patient expresses understanding and is agreeable to the plan.      History     Chief Complaint   Patient presents with   ??? Emesis       HPI    Marilyn Fry is a 67 y.o. female with PMH of chronic back pain, hypothyroidism, OA, and recent hx of NSTEMI on 11/7, dx with Takasubo cardiomyopathy s/p normal cath presenting to the ED for abdominal pain. Patient reports 3 weeks of diffuse upper abdominal pain that radiates to her back bilaterally, with associated vomiting, dysuria, and constipation. She states she has not had a good BM in 3 weeks. She states she is taking nausea medication to some relief of her nausea. Patient denies fevers, chest pain, diarrhea, or any other medical concerns at this time.      Per Chart Review, patient was evaluated in the ED 2 days ago for similar symptoms of back pain and vomiting. CMP showed AKI with creatinine up to 1.48 from 0.47 on 11/13. She had a negative CXR, XR t-spine, and troponin. She was d/c home with Keflex x 10 days, phenergan, and tramadol and a referral to [facility].     Past Medical History:   Diagnosis Date   ??? Arthritis     likely osteoarthritis    ??? Back pain    ??? Bladder spasm    ??? Disease of thyroid gland    ??? 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 Painful urination    ??? Undifferentiated connective tissue disease (CMS-HCC)    ??? Vaginal discharge    ??? 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 Laparoscopic Bilateral oophrectomy for torsion     ??? SKIN BIOPSY     ??? TOTAL ABDOMINAL HYSTERECTOMY      Menorrhagia       Family History   Problem Relation Age of Onset   ??? Heart failure Mother    ??? Kidney cancer Sister    ??? Heart failure Maternal Grandmother    ??? Dementia Maternal Grandmother    ??? Heart failure Paternal Grandmother    ???  Colon cancer Maternal Aunt    ??? Arthritis Neg Hx    ??? Melanoma Neg Hx    ??? Basal cell carcinoma Neg Hx    ??? Squamous cell carcinoma Neg Hx        Social History     Socioeconomic History   ??? Marital status: Married     Spouse name: None   ??? Number of children: None   ??? Years of education: None   ??? Highest education level: None   Occupational History   ??? None   Social Needs   ??? Financial resource strain: None   ??? Food insecurity:     Worry: None     Inability: None   ??? Transportation needs:     Medical: None     Non-medical: None   Tobacco Use   ??? Smoking status: Never Smoker   ??? Smokeless tobacco: Never Used   Substance and Sexual Activity   ??? Alcohol use: No   ??? Drug use: No   ??? Sexual activity: None   Lifestyle   ??? Physical activity:     Days per week: None     Minutes per session: None   ??? Stress: None   Relationships   ??? Social connections:     Talks on phone: None     Gets together: None     Attends religious service: None     Active member of club or organization: None     Attends meetings of clubs or organizations: None     Relationship status: None   Other Topics Concern   ??? Do you use sunscreen? No   ??? Tanning bed use? No   ??? Are you easily burned? No   ??? Excessive sun exposure? No   ??? Blistering sunburns? No   Social History Narrative    Husband passed away in 2013/07/18.     Review of Systems   Constitutional: Negative for fever.   Cardiovascular: Negative for chest pain.   Gastrointestinal: Positive for abdominal pain, constipation, nausea and vomiting. Negative for diarrhea.   Genitourinary: Positive for dysuria.   All other systems reviewed and are negative.     Physical Exam     BP 162/100  - Pulse 80  - Temp 36.4 ??C (97.6 ??F) (Oral)  - Resp 16  - SpO2 95%     Physical Exam  Constitutional:       Appearance: She is well-developed.      Comments: Chronically ill and frail appearing.   HENT:      Head: Atraumatic.      Nose: Nose normal.      Mouth/Throat:      Mouth: Mucous membranes are dry.   Eyes:      General:         Right eye: No discharge.         Left eye: No discharge.      Conjunctiva/sclera: Conjunctivae normal.      Pupils: Pupils are equal, round, and reactive to light.   Neck:      Musculoskeletal: Normal range of motion and neck supple.      Trachea: No tracheal deviation.   Cardiovascular:      Rate and Rhythm: Normal rate and regular rhythm.      Heart sounds: Normal heart sounds. No murmur.   Pulmonary:      Effort: Pulmonary effort is normal. No respiratory distress.      Breath sounds:  Normal breath sounds. No wheezing.   Abdominal:      General: Bowel sounds are normal.      Palpations: Abdomen is soft.      Tenderness: There is no tenderness.   Musculoskeletal: Normal range of motion.   Skin:     General: Skin is warm and dry.      Capillary Refill: Capillary refill takes less than 2 seconds.   Neurological:      Mental Status: She is alert and oriented to person, place, and time.   Psychiatric:         Behavior: Behavior normal.        Radiology     US Abdomen Limited - Gallbladder and Biliary Tree   Final Result      Redemonstration of enlarged, hydropic gallbladder. No cholelithiasis or biliary ductal dilatation.      CT Abdomen Pelvis with IV Contrast ONLY   Final Result      Markedly hydropic gallbladder without gallbladder wall thickening or pericholecystic fluid. No gallstones are seen.      Distended first portion of the duodenum which may be secondary to mass effect from adjacent gallbladder. A bowel obstruction does not appear to be present.        ______________________________________________________   Documentation assistance was provided by Eliezer Mccoy, Scribe, on July 19, 2018 at 10:35 AM for Cheri Rous, NP.     Documentation assistance was provided by the scribe in my presence.  The documentation recorded by the scribe has been reviewed by me and accurately reflects the services I personally performed.      Junious Dresser [place], [place]  07/19/18 1650

## 2018-07-19 NOTE — Unmapped (Signed)
ED Procedure Note    EKG Interpretation  Date/Time: 07/19/2018 10:49 AM  Performed by: Nicholes Calamity, FNP  Authorized by: Nicholes Calamity, FNP     Previous ECG:     Previous ECG:  Compared to current    Comparison ECG info:  07/17/2018    Similarity:  No change  Interpretation:     Interpretation: abnormal    Rate:     ECG rate:  87    ECG rate assessment: normal    Rhythm:     Rhythm: sinus rhythm    Ectopy:     Ectopy: none    ST segments:     ST segments:  Normal  T waves:     T waves: inverted      Inverted:  V6, V5, V4, V3, V2, III and I  Other findings:     Other findings: prolonged qTc interval      Other findings comment:  QTc 553

## 2018-07-19 NOTE — Unmapped (Signed)
Discharge completed and all questions answered. Pt given all paperwork and follow up info including phone numbers. Pt denies questions and left dept with steady gait in no acute distress.

## 2018-07-20 ENCOUNTER — Other Ambulatory Visit: Payer: Self-pay | Admitting: *Deleted

## 2018-07-20 NOTE — Patient Outreach (Signed)
[facility]) Care Management  07/20/2018  Katherine Baird March 27, 1951 [id number]   Care coordination Referral received : 11/8 Referral source : [facility] Care Manager Referral reason : [facility] inpatient admission , 117-11/8 NSTEMI , cardiac cath results : normal coronary anatomy , overall preserved left ventricular function ,Takosubo cardiomyopathy  Insurance : [facility] Medicare   PMHx noted : significant for Hypertension , Diastolic Heart failure, recent UTI , back pain,iron deficiency , osteoporosis  ED visits 11/3 Dx: Chest pain  ED visit 11/22 at [facility] ED  Dx : Pyelonephritis , back pain  ED visit 11/24 at [facility] ED Dx: Abdominal pain, Constipation. Referral to [facility] spine center regarding T4 compression fracture, await phone call within a week regarding scheduling an appointment .    In basket message from [facility] CMA regarding incoming message from patient asking about follow up on bath aide service.  Place call to [facility] home [facility] service, representative states that patient is scheduled for bath aide service on today as well as Wednesday of this week.   Placed call to patient ,she discussed her visits to ED at [facility] for abdominal pain , back pain. Dx: constipation and UTI . She discussed feeling better on today and sounds stronger in her voice. She denies chest pain.   Patient reports going to PCP office for a visit on 11/22, and it was recommended that she go to the emergency room.   Conditions  Constipation : now using miralax daily, report getting relief after getting enema and having a bowel movement at home.working on eating more vegetables, drinking liquids without problems.   Decreased appetite/nausea- nausea improving after relief of constipation, decreased appetite discussed with patient, use of nutrition supplements, ensure boost, patient reports that she will try nutrition bars. Reports being  unable to eat crackers due to chronic dry  mouth.  UTI - has antibiotic prescription, encouraged completing full prescription, reviewed signs of UTI ,encouraged to notify MD of unresolved symptoms. Reinforced drinking plenty fluids    Back pain - reports improvement in pain , taking prn tramadol prn.  Personal care - Discussed with patient [facility] plan for bath aide Monday and Wednesday , provided with contact number to agency .    Plan  Will plan follow up call in the next 2 weeks.  Reinforced scheduling PCP office visit.  Reinforced completing full antibiotics prescription.   [facility] CM Care Plan Problem One     Most Recent Value  Care Plan Problem One  High risk for readmission related to recent hospital admission for NSTEMI, Takasubo cardiomyopathy   Role Documenting the Problem One  Care Management Coordinator  Care Plan for Problem One  Active  [facility] Long Term Goal   Patient will not experience a hospital readmission over the next 31 days  [facility] Long Term Goal Start Date  07/10/18  Interventions for Problem One Long Term Goal  Advised adherence to discharge instructions, reviewed symptoms of UTI and discussed notifying MD of worsening or unresolved symptoms .   [facility] CM Short Term Goal #1   Patient will report attending all medical appointments over the next 30 days   [facility] CM Short Term Goal #1 Start Date  07/10/18  Interventions for Short Term Goal #1  Advised regarding rescheduling PCP post DC and ED visit appointments   [facility] CM Short Term Goal #2   Patient will be able to report participation in daily physical therapy exercises over the next 30 days  [facility] CM Short Term Goal #2 Start Date  07/15/18  Interventions for Short Term Goal #2  Encouraged participating in therapy exercises.   [facility] CM Short Term Goal #3  Over the next 30 days patient will be able to report monitoring blood pressure at least 3 times a week.   [facility] CM Short Term Goal #3 Start Date  07/15/18  Interventions for Short Tern Goal #3  Encouraged patient  regarding monitoring blood pressure in home and keep a record to take to PCP visit.     [facility] CM Care Plan Problem Two     Most Recent Value  Care Plan Problem Two  Chronic back pain related to arthritis   Role Documenting the Problem Two  Care Management Coordinator  Care Plan for Problem Two  Active  [facility] CM Short Term Goal #1   Patient will be able to verbalize improvemnt of back pain over the next 30 days   [facility] CM Short Term Goal #1 Start Date  07/20/18  Interventions for Short Term Goal #2   Discussed with patient , keeping all referral appointments, notifying MD of worsening pain, taking medications as prescribed.       [name], RN, [facility] Care Management,Care Management Coordinator  [phone]- Mobile [phone]- Toll Free Main Office

## 2018-07-21 ENCOUNTER — Other Ambulatory Visit: Payer: Self-pay | Admitting: *Deleted

## 2018-07-21 DIAGNOSIS — I110 Hypertensive heart disease with heart failure: Secondary | ICD-10-CM | POA: Diagnosis not present

## 2018-07-21 DIAGNOSIS — N390 Urinary tract infection, site not specified: Secondary | ICD-10-CM | POA: Diagnosis not present

## 2018-07-21 DIAGNOSIS — I5032 Chronic diastolic (congestive) heart failure: Secondary | ICD-10-CM | POA: Diagnosis not present

## 2018-07-21 DIAGNOSIS — D509 Iron deficiency anemia, unspecified: Secondary | ICD-10-CM | POA: Diagnosis not present

## 2018-07-21 DIAGNOSIS — I480 Paroxysmal atrial fibrillation: Secondary | ICD-10-CM | POA: Diagnosis not present

## 2018-07-21 DIAGNOSIS — E039 Hypothyroidism, unspecified: Secondary | ICD-10-CM | POA: Diagnosis not present

## 2018-07-21 DIAGNOSIS — M810 Age-related osteoporosis without current pathological fracture: Secondary | ICD-10-CM | POA: Diagnosis not present

## 2018-07-21 DIAGNOSIS — M0689 Other specified rheumatoid arthritis, multiple sites: Secondary | ICD-10-CM | POA: Diagnosis not present

## 2018-07-21 DIAGNOSIS — Z9181 History of falling: Secondary | ICD-10-CM | POA: Diagnosis not present

## 2018-07-21 DIAGNOSIS — Z792 Long term (current) use of antibiotics: Secondary | ICD-10-CM | POA: Diagnosis not present

## 2018-07-21 DIAGNOSIS — I214 Non-ST elevation (NSTEMI) myocardial infarction: Secondary | ICD-10-CM | POA: Diagnosis not present

## 2018-07-21 NOTE — Patient Outreach (Signed)
[facility]) Care Management  07/21/2018  Katherine Baird July 23, 1951 [id number]  Care Coordination   In basket message received , patient has received message about bath aide with [facility] for 11/27 at 8 am.  Placed call to patient she states that [facility] and asking if bath aide could come on today.  Explained to patient that I could contact [facility] to notify them if bath aide could be arranged for today, but it  may not be possible. Patient voiced understanding and states that her sister may be able to help her tonight.  Placed call to [facility] able to leave a message with scheduler regarding patient request, provided my contact number as well as patient .  I again provided patient with [facility] home health number.   Plan  Will plan patient follow up as planned next month.    [name], RN, [facility] Care Management,Care Management Coordinator  [phone]- Mobile [phone]- Toll Free Main Office

## 2018-07-22 ENCOUNTER — Ambulatory Visit: Payer: Self-pay | Admitting: *Deleted

## 2018-07-22 DIAGNOSIS — M0689 Other specified rheumatoid arthritis, multiple sites: Secondary | ICD-10-CM | POA: Diagnosis not present

## 2018-07-22 DIAGNOSIS — M810 Age-related osteoporosis without current pathological fracture: Secondary | ICD-10-CM | POA: Diagnosis not present

## 2018-07-22 DIAGNOSIS — Z79899 Other long term (current) drug therapy: Secondary | ICD-10-CM | POA: Diagnosis not present

## 2018-07-22 DIAGNOSIS — Z5181 Encounter for therapeutic drug level monitoring: Secondary | ICD-10-CM | POA: Diagnosis not present

## 2018-07-22 DIAGNOSIS — I214 Non-ST elevation (NSTEMI) myocardial infarction: Secondary | ICD-10-CM | POA: Diagnosis not present

## 2018-07-22 DIAGNOSIS — D509 Iron deficiency anemia, unspecified: Secondary | ICD-10-CM | POA: Diagnosis not present

## 2018-07-22 DIAGNOSIS — I480 Paroxysmal atrial fibrillation: Secondary | ICD-10-CM | POA: Diagnosis not present

## 2018-07-22 DIAGNOSIS — Z792 Long term (current) use of antibiotics: Secondary | ICD-10-CM | POA: Diagnosis not present

## 2018-07-22 DIAGNOSIS — I5032 Chronic diastolic (congestive) heart failure: Secondary | ICD-10-CM | POA: Diagnosis not present

## 2018-07-22 DIAGNOSIS — E039 Hypothyroidism, unspecified: Secondary | ICD-10-CM | POA: Diagnosis not present

## 2018-07-22 DIAGNOSIS — Z9181 History of falling: Secondary | ICD-10-CM | POA: Diagnosis not present

## 2018-07-22 DIAGNOSIS — I110 Hypertensive heart disease with heart failure: Secondary | ICD-10-CM | POA: Diagnosis not present

## 2018-07-22 DIAGNOSIS — M546 Pain in thoracic spine: Secondary | ICD-10-CM | POA: Diagnosis not present

## 2018-07-22 DIAGNOSIS — N390 Urinary tract infection, site not specified: Secondary | ICD-10-CM | POA: Diagnosis not present

## 2018-07-27 DIAGNOSIS — I480 Paroxysmal atrial fibrillation: Secondary | ICD-10-CM | POA: Diagnosis not present

## 2018-07-27 DIAGNOSIS — N390 Urinary tract infection, site not specified: Secondary | ICD-10-CM | POA: Diagnosis not present

## 2018-07-27 DIAGNOSIS — I5032 Chronic diastolic (congestive) heart failure: Secondary | ICD-10-CM | POA: Diagnosis not present

## 2018-07-27 DIAGNOSIS — D509 Iron deficiency anemia, unspecified: Secondary | ICD-10-CM | POA: Diagnosis not present

## 2018-07-27 DIAGNOSIS — E039 Hypothyroidism, unspecified: Secondary | ICD-10-CM | POA: Diagnosis not present

## 2018-07-27 DIAGNOSIS — M0689 Other specified rheumatoid arthritis, multiple sites: Secondary | ICD-10-CM | POA: Diagnosis not present

## 2018-07-27 DIAGNOSIS — I110 Hypertensive heart disease with heart failure: Secondary | ICD-10-CM | POA: Diagnosis not present

## 2018-07-27 DIAGNOSIS — I214 Non-ST elevation (NSTEMI) myocardial infarction: Secondary | ICD-10-CM | POA: Diagnosis not present

## 2018-07-27 DIAGNOSIS — Z9181 History of falling: Secondary | ICD-10-CM | POA: Diagnosis not present

## 2018-07-27 DIAGNOSIS — Z792 Long term (current) use of antibiotics: Secondary | ICD-10-CM | POA: Diagnosis not present

## 2018-07-27 DIAGNOSIS — M810 Age-related osteoporosis without current pathological fracture: Secondary | ICD-10-CM | POA: Diagnosis not present

## 2018-07-28 ENCOUNTER — Telehealth: Payer: Self-pay

## 2018-07-28 ENCOUNTER — Other Ambulatory Visit: Payer: Self-pay | Admitting: Internal Medicine

## 2018-07-28 DIAGNOSIS — I5032 Chronic diastolic (congestive) heart failure: Secondary | ICD-10-CM | POA: Diagnosis not present

## 2018-07-28 DIAGNOSIS — I214 Non-ST elevation (NSTEMI) myocardial infarction: Secondary | ICD-10-CM | POA: Diagnosis not present

## 2018-07-28 DIAGNOSIS — Z9181 History of falling: Secondary | ICD-10-CM | POA: Diagnosis not present

## 2018-07-28 DIAGNOSIS — N390 Urinary tract infection, site not specified: Secondary | ICD-10-CM | POA: Diagnosis not present

## 2018-07-28 DIAGNOSIS — Z792 Long term (current) use of antibiotics: Secondary | ICD-10-CM | POA: Diagnosis not present

## 2018-07-28 DIAGNOSIS — E039 Hypothyroidism, unspecified: Secondary | ICD-10-CM | POA: Diagnosis not present

## 2018-07-28 DIAGNOSIS — D509 Iron deficiency anemia, unspecified: Secondary | ICD-10-CM | POA: Diagnosis not present

## 2018-07-28 DIAGNOSIS — I110 Hypertensive heart disease with heart failure: Secondary | ICD-10-CM | POA: Diagnosis not present

## 2018-07-28 DIAGNOSIS — M810 Age-related osteoporosis without current pathological fracture: Secondary | ICD-10-CM | POA: Diagnosis not present

## 2018-07-28 DIAGNOSIS — M0689 Other specified rheumatoid arthritis, multiple sites: Secondary | ICD-10-CM | POA: Diagnosis not present

## 2018-07-28 DIAGNOSIS — I480 Paroxysmal atrial fibrillation: Secondary | ICD-10-CM | POA: Diagnosis not present

## 2018-07-28 NOTE — Telephone Encounter (Signed)
Contrella from [facility] called stating patient is in pain and has not had a Bm since November 233rd ( 10 days). Informed Contrella that patient needs to go to the hospital. Patient is taking stool softeners and miralax but no relief. She stated she will inform patient to go to the hospital.   CB# [phone]

## 2018-07-29 ENCOUNTER — Other Ambulatory Visit: Payer: Self-pay | Admitting: Rheumatology

## 2018-07-29 DIAGNOSIS — M0550 Rheumatoid polyneuropathy with rheumatoid arthritis of unspecified site: Secondary | ICD-10-CM | POA: Diagnosis not present

## 2018-07-29 DIAGNOSIS — I252 Old myocardial infarction: Secondary | ICD-10-CM | POA: Diagnosis not present

## 2018-07-29 DIAGNOSIS — I959 Hypotension, unspecified: Secondary | ICD-10-CM | POA: Diagnosis not present

## 2018-07-29 DIAGNOSIS — G9389 Other specified disorders of brain: Secondary | ICD-10-CM | POA: Diagnosis not present

## 2018-07-29 DIAGNOSIS — E43 Unspecified severe protein-calorie malnutrition: Secondary | ICD-10-CM | POA: Diagnosis not present

## 2018-07-29 DIAGNOSIS — M810 Age-related osteoporosis without current pathological fracture: Secondary | ICD-10-CM | POA: Diagnosis not present

## 2018-07-29 DIAGNOSIS — R64 Cachexia: Secondary | ICD-10-CM | POA: Diagnosis not present

## 2018-07-29 DIAGNOSIS — M069 Rheumatoid arthritis, unspecified: Secondary | ICD-10-CM | POA: Diagnosis not present

## 2018-07-29 DIAGNOSIS — Z882 Allergy status to sulfonamides status: Secondary | ICD-10-CM | POA: Diagnosis not present

## 2018-07-29 DIAGNOSIS — I110 Hypertensive heart disease with heart failure: Secondary | ICD-10-CM | POA: Diagnosis not present

## 2018-07-29 DIAGNOSIS — I5022 Chronic systolic (congestive) heart failure: Secondary | ICD-10-CM | POA: Diagnosis not present

## 2018-07-29 DIAGNOSIS — R1110 Vomiting, unspecified: Secondary | ICD-10-CM | POA: Diagnosis not present

## 2018-07-29 DIAGNOSIS — I10 Essential (primary) hypertension: Secondary | ICD-10-CM | POA: Diagnosis not present

## 2018-07-29 DIAGNOSIS — N3289 Other specified disorders of bladder: Secondary | ICD-10-CM | POA: Diagnosis not present

## 2018-07-29 DIAGNOSIS — E860 Dehydration: Secondary | ICD-10-CM | POA: Diagnosis not present

## 2018-07-29 DIAGNOSIS — I5181 Takotsubo syndrome: Secondary | ICD-10-CM | POA: Diagnosis not present

## 2018-07-29 DIAGNOSIS — L309 Dermatitis, unspecified: Secondary | ICD-10-CM | POA: Diagnosis not present

## 2018-07-29 DIAGNOSIS — R109 Unspecified abdominal pain: Secondary | ICD-10-CM | POA: Diagnosis not present

## 2018-07-29 DIAGNOSIS — B029 Zoster without complications: Secondary | ICD-10-CM | POA: Diagnosis not present

## 2018-07-29 DIAGNOSIS — E785 Hyperlipidemia, unspecified: Secondary | ICD-10-CM | POA: Diagnosis not present

## 2018-07-29 DIAGNOSIS — M546 Pain in thoracic spine: Secondary | ICD-10-CM

## 2018-07-29 DIAGNOSIS — I480 Paroxysmal atrial fibrillation: Secondary | ICD-10-CM | POA: Diagnosis not present

## 2018-07-29 DIAGNOSIS — N179 Acute kidney failure, unspecified: Secondary | ICD-10-CM | POA: Diagnosis not present

## 2018-07-29 DIAGNOSIS — E039 Hypothyroidism, unspecified: Secondary | ICD-10-CM | POA: Diagnosis not present

## 2018-07-29 DIAGNOSIS — I214 Non-ST elevation (NSTEMI) myocardial infarction: Secondary | ICD-10-CM | POA: Diagnosis not present

## 2018-07-29 DIAGNOSIS — Z8673 Personal history of transient ischemic attack (TIA), and cerebral infarction without residual deficits: Secondary | ICD-10-CM | POA: Diagnosis not present

## 2018-07-29 DIAGNOSIS — G8929 Other chronic pain: Secondary | ICD-10-CM | POA: Diagnosis not present

## 2018-07-29 DIAGNOSIS — R627 Adult failure to thrive: Secondary | ICD-10-CM | POA: Diagnosis not present

## 2018-07-29 DIAGNOSIS — N289 Disorder of kidney and ureter, unspecified: Secondary | ICD-10-CM | POA: Diagnosis not present

## 2018-07-29 DIAGNOSIS — Z792 Long term (current) use of antibiotics: Secondary | ICD-10-CM | POA: Diagnosis not present

## 2018-07-29 DIAGNOSIS — M0689 Other specified rheumatoid arthritis, multiple sites: Secondary | ICD-10-CM | POA: Diagnosis not present

## 2018-07-29 DIAGNOSIS — K5900 Constipation, unspecified: Secondary | ICD-10-CM | POA: Diagnosis not present

## 2018-07-29 DIAGNOSIS — I7300 Raynaud's syndrome without gangrene: Secondary | ICD-10-CM | POA: Diagnosis not present

## 2018-07-29 DIAGNOSIS — I420 Dilated cardiomyopathy: Secondary | ICD-10-CM | POA: Diagnosis not present

## 2018-07-29 DIAGNOSIS — D509 Iron deficiency anemia, unspecified: Secondary | ICD-10-CM | POA: Diagnosis not present

## 2018-07-29 DIAGNOSIS — I4581 Long QT syndrome: Secondary | ICD-10-CM | POA: Diagnosis not present

## 2018-07-29 DIAGNOSIS — Z800 Family history of malignant neoplasm of digestive organs: Secondary | ICD-10-CM | POA: Diagnosis not present

## 2018-07-29 DIAGNOSIS — I5032 Chronic diastolic (congestive) heart failure: Secondary | ICD-10-CM | POA: Diagnosis not present

## 2018-07-29 DIAGNOSIS — M549 Dorsalgia, unspecified: Secondary | ICD-10-CM | POA: Diagnosis not present

## 2018-07-29 DIAGNOSIS — R1084 Generalized abdominal pain: Secondary | ICD-10-CM | POA: Diagnosis not present

## 2018-07-29 DIAGNOSIS — N390 Urinary tract infection, site not specified: Secondary | ICD-10-CM | POA: Diagnosis not present

## 2018-07-29 DIAGNOSIS — Z9181 History of falling: Secondary | ICD-10-CM | POA: Diagnosis not present

## 2018-07-29 DIAGNOSIS — R112 Nausea with vomiting, unspecified: Secondary | ICD-10-CM | POA: Diagnosis not present

## 2018-07-29 DIAGNOSIS — E872 Acidosis: Secondary | ICD-10-CM | POA: Diagnosis not present

## 2018-07-30 ENCOUNTER — Ambulatory Visit: Admit: 2018-07-30 | Discharge: 2018-08-04 | Disposition: A | Discharge status: Home Health | Payer: MEDICARE

## 2018-07-30 ENCOUNTER — Encounter: Admit: 2018-07-30 | Discharge: 2018-08-04 | Disposition: A | Discharge status: Home Health | Payer: MEDICARE

## 2018-07-30 DIAGNOSIS — R109 Unspecified abdominal pain: Principal | ICD-10-CM

## 2018-07-30 DIAGNOSIS — K5900 Constipation, unspecified: Secondary | ICD-10-CM | POA: Insufficient documentation

## 2018-07-30 LAB — BASIC METABOLIC PANEL
ANION GAP: 10 mmol/L (ref 7–15)
BLOOD UREA NITROGEN: 38 mg/dL — ABNORMAL HIGH (ref 7–21)
BUN / CREAT RATIO: 27
CALCIUM: 7.8 mg/dL — ABNORMAL LOW (ref 8.5–10.2)
CHLORIDE: 112 mmol/L — ABNORMAL HIGH (ref 98–107)
CO2: 16 mmol/L — ABNORMAL LOW (ref 22.0–30.0)
CREATININE: 1.43 mg/dL — ABNORMAL HIGH (ref 0.60–1.00)
EGFR CKD-EPI AA FEMALE: 44 mL/min/{1.73_m2} — ABNORMAL LOW (ref >=60)
GLUCOSE RANDOM: 110 mg/dL (ref 65–179)
POTASSIUM: 3.8 mmol/L (ref 3.5–5.0)
SODIUM: 138 mmol/L (ref 135–145)

## 2018-07-30 LAB — BLOOD GAS CRITICAL CARE PANEL, VENOUS
BASE EXCESS VENOUS: -7.1 — ABNORMAL LOW (ref -2.0–2.0)
CALCIUM IONIZED VENOUS (MG/DL): 4.65 mg/dL (ref 4.40–5.40)
GLUCOSE WHOLE BLOOD: 127 mg/dL
HCO3 VENOUS: 17 mmol/L — ABNORMAL LOW (ref 22–27)
HEMOGLOBIN BLOOD GAS: 13.6 g/dL (ref 12.00–16.00)
PCO2 VENOUS: 49 mmHg (ref 40–60)
PH VENOUS: 7.23 — ABNORMAL LOW (ref 7.32–7.43)
PO2 VENOUS: 22 mmHg — ABNORMAL LOW (ref 30–55)
POTASSIUM WHOLE BLOOD: 5.7 mmol/L — ABNORMAL HIGH (ref 3.4–4.6)
SODIUM WHOLE BLOOD: 137 mmol/L (ref 135–145)

## 2018-07-30 LAB — CBC W/ AUTO DIFF
BASOPHILS ABSOLUTE COUNT: 0.1 10*9/L (ref 0.0–0.1)
EOSINOPHILS ABSOLUTE COUNT: 0.4 10*9/L (ref 0.0–0.4)
EOSINOPHILS RELATIVE PERCENT: 2.1 %
HEMATOCRIT: 47.4 % — ABNORMAL HIGH (ref 36.0–46.0)
HEMOGLOBIN: 15.1 g/dL (ref 13.5–16.0)
LARGE UNSTAINED CELLS: 1 % (ref 0–4)
LYMPHOCYTES ABSOLUTE COUNT: 1.4 10*9/L — ABNORMAL LOW (ref 1.5–5.0)
LYMPHOCYTES RELATIVE PERCENT: 8.2 %
MEAN CORPUSCULAR HEMOGLOBIN CONC: 31.9 g/dL (ref 31.0–37.0)
MEAN CORPUSCULAR HEMOGLOBIN: 30.4 pg (ref 26.0–34.0)
MEAN CORPUSCULAR VOLUME: 95.3 fL (ref 80.0–100.0)
MEAN PLATELET VOLUME: 8.2 fL (ref 7.0–10.0)
MONOCYTES ABSOLUTE COUNT: 0.3 10*9/L (ref 0.2–0.8)
MONOCYTES RELATIVE PERCENT: 1.8 %
NEUTROPHILS ABSOLUTE COUNT: 14.5 10*9/L — ABNORMAL HIGH (ref 2.0–7.5)
NEUTROPHILS RELATIVE PERCENT: 86.8 %
PLATELET COUNT: 354 10*9/L (ref 150–440)
RED BLOOD CELL COUNT: 4.97 10*12/L (ref 4.00–5.20)
RED CELL DISTRIBUTION WIDTH: 16.3 % — ABNORMAL HIGH (ref 12.0–15.0)
WBC ADJUSTED: 16.7 10*9/L — ABNORMAL HIGH (ref 4.5–11.0)

## 2018-07-30 LAB — URINALYSIS WITH CULTURE REFLEX
BLOOD UA: NEGATIVE
GLUCOSE UA: NEGATIVE
GRANULAR CASTS: 3 /LPF — ABNORMAL HIGH
NITRITE UA: POSITIVE — AB
PH UA: 5 (ref 5.0–9.0)
RBC UA: 4 /HPF — ABNORMAL HIGH (ref <4)
SPECIFIC GRAVITY UA: 1.025 (ref 1.005–1.040)
UROBILINOGEN UA: 1
WBC UA: 5 /HPF (ref 0–5)

## 2018-07-30 LAB — COMPREHENSIVE METABOLIC PANEL
ALBUMIN: 4.1 g/dL (ref 3.5–5.0)
ALT (SGPT): 9 U/L (ref <35)
ANION GAP: 18 mmol/L — ABNORMAL HIGH (ref 7–15)
AST (SGOT): 31 U/L (ref 14–38)
BILIRUBIN TOTAL: 1.3 mg/dL — ABNORMAL HIGH (ref 0.0–1.2)
BLOOD UREA NITROGEN: 42 mg/dL — ABNORMAL HIGH (ref 7–21)
BUN / CREAT RATIO: 21
CALCIUM: 9.6 mg/dL (ref 8.5–10.2)
CHLORIDE: 104 mmol/L (ref 98–107)
CO2: 14 mmol/L — ABNORMAL LOW (ref 22.0–30.0)
CREATININE: 2.04 mg/dL — ABNORMAL HIGH (ref 0.60–1.00)
EGFR CKD-EPI AA FEMALE: 28 mL/min/{1.73_m2} — ABNORMAL LOW (ref >=60)
EGFR CKD-EPI NON-AA FEMALE: 25 mL/min/{1.73_m2} — ABNORMAL LOW (ref >=60)
GLUCOSE RANDOM: 140 mg/dL (ref 65–179)
POTASSIUM: 4.4 mmol/L (ref 3.5–5.0)
PROTEIN TOTAL: 7.3 g/dL (ref 6.5–8.3)

## 2018-07-30 LAB — FREE T4: Thyroxine.free:MCnc:Pt:Ser/Plas:Qn:: 2.06 — ABNORMAL HIGH

## 2018-07-30 LAB — LIPASE: Triacylglycerol lipase:CCnc:Pt:Ser/Plas:Qn:: 263 — ABNORMAL HIGH

## 2018-07-30 LAB — APTT: HEPARIN CORRELATION: 0.2

## 2018-07-30 LAB — CALCIUM: Calcium:MCnc:Pt:Ser/Plas:Qn:: 7.8 — ABNORMAL LOW

## 2018-07-30 LAB — LACTATE BLOOD VENOUS: Lactate:SCnc:Pt:BldV:Qn:: 1

## 2018-07-30 LAB — MEAN CORPUSCULAR VOLUME: Lab: 95.3

## 2018-07-30 LAB — TROPONIN I: Troponin I.cardiac:MCnc:Pt:Ser/Plas:Qn:: <0.06

## 2018-07-30 LAB — PROTIME: Lab: 14.1 — ABNORMAL HIGH

## 2018-07-30 LAB — MAGNESIUM: Magnesium:MCnc:Pt:Ser/Plas:Qn:: 2.5 — ABNORMAL HIGH

## 2018-07-30 LAB — THYROID STIMULATING HORMONE: Thyrotropin:ACnc:Pt:Ser/Plas:Qn:: 1.678

## 2018-07-30 LAB — PO2 VENOUS: Oxygen:PPres:Pt:BldV:Qn:: 22 — ABNORMAL LOW

## 2018-07-30 LAB — LACTATE, VENOUS, WHOLE BLOOD: LACTATE BLOOD VENOUS: 1 mmol/L (ref 0.5–1.8)

## 2018-07-30 LAB — CO2: Carbon dioxide:SCnc:Pt:Ser/Plas:Qn:: 14 — ABNORMAL LOW

## 2018-07-30 LAB — NITRITE UA: Lab: POSITIVE — AB

## 2018-07-30 LAB — HEPARIN CORRELATION: Lab: 0.2

## 2018-07-30 LAB — PHOSPHORUS: Phosphate:MCnc:Pt:Ser/Plas:Qn:: 5.1 — ABNORMAL HIGH

## 2018-07-30 NOTE — Unmapped (Signed)
Pt arrived to unit around 5am from ED. Pt was taken directly to room assessed, medicated, and oriented to unit. Pt was accompanied by her brother and his wife. Pt stable, no acute events.   Neuro: Extremely quiet voice. Drowsy, but AOx4. CAM-ICU -. PERRL. No prn's given overnight.   CV: Floor status. HR 80's. BP 90-100's. Afebrile. Pulses palpable.   Resp: RA, no desaturations noted.   GI/GU: 10 days since last BM. No UOP since admit. Bladder scan <269ml. Regular diet, poor po intake.   Lines: New acqucath placed in R bicep w/ LR @ 100. 20g RAC, redressed, flushed/patent.  Wounds: Skin intact, reddened spot on sacrum. Mepilex placed. Pt bathed overnight.   Pt safe/free from falls. Q2H turns.     Problem: Adult Inpatient Plan of Care  Goal: Plan of Care Review  Outcome: Progressing  Goal: Patient-Specific Goal (Individualization)  Outcome: Progressing  Goal: Absence of Hospital-Acquired Illness or Injury  Outcome: Progressing  Goal: Optimal Comfort and Wellbeing  Outcome: Progressing  Goal: Readiness for Transition of Care  Outcome: Progressing  Goal: Rounds/Family Conference  Outcome: Progressing     Problem: Infection  Goal: Infection Symptom Resolution  Outcome: Progressing     Problem: Skin Injury Risk Increased  Goal: Skin Health and Integrity  Outcome: Progressing     Problem: Self-Care Deficit  Goal: Improved Ability to Complete Activities of Daily Living  Outcome: Progressing     Problem: Fall Injury Risk  Goal: Absence of Fall and Fall-Related Injury  Outcome: Progressing

## 2018-07-30 NOTE — Unmapped (Signed)
Adult Nutrition Assessment Note    Visit Type: RN Consult  Reason for Visit: Per Admission Nutrition Screen (Adult), Have you gained or lost 10 pounds in the past 3 months?    ASSESSMENT:   HPI & PMH: Per MD note, patient is a 67 y.o. female with PMHx as noted below who presents to [facility] with Constipation.  Nutrition Hx: RD spoke with patient and patient's sister today.  Sister provided much of the needed information.  Sister reports that she and the patient help take care of their mother who has Alzheimer's disease with 24-our care at home.  Sister reports that she comes in from out of town once a month for about a week and will prepare a month's worth of food for mother and caretakers.  Sister states that the patient only eats bites of food without eating any real meals and she has noticed a significant decrease in the last 2 weeks.  Sister does believe that patient may go a full day without eating.  Sister concerned with patient's intake as she states she does not eat well.  Bites of food  may consist of popcorn or other snack type things and if they all sit down for a meal together, patient will only eat a very small portion.  Sister believes patient UBW: 110#, but she is unsure when she last weighed this.  Sister thinks patient may be accepting of nutrition supplements.  Nutritionally Pertinent Meds: Dulcolax, Levothyroxine, Miralax, LR @ 177mL/hr  Labs: Hct: 47.4(H), Cl: 112(H), BUN: 38(H), creat: 1.43(H)  Abd/GI: Patient minimally interactive.  Patient with significant constipation per MD.  Report indicates patient has not had a BM in 10 days.    Skin: Intact  Patient Lines/Drains/Airways Status    Active Wounds     None               Current nutrition therapy order:   Nutrition Orders          Nutrition Therapy General (Regular) starting at 12/05 0521           Anthropometric Data:  -- Height: 149.9 cm (4' 11)   -- Last recorded weight: 44.2 kg (97 lb 7.1 oz)  -- Admission weight: 44.2 kg (97 lb 7.1 oz)  -- IBW: 45.45 kg  -- Percent IBW: 97.25 %  -- BMI: Body mass index is 19.68 kg/m??.   -- Weight changes this admission:   Last 5 Recorded Weights    07/30/18 0518   Weight: 44.2 kg (97 lb 7.1 oz)      -- Weight history PTA: -4.6kg (9.4%) in the last 6 months, -5.7kg (11.4%) in the last 3 months  Wt Readings from Last 10 Encounters:   07/30/18 44.2 kg (97 lb 7.1 oz)   07/17/18 42.2 kg (93 lb)   04/24/18 49.9 kg (110 lb)   03/12/18 46 kg (101 lb 8 oz)   01/29/18 48.8 kg (107 lb 9.6 oz)   01/08/18 47.9 kg (105 lb 11.2 oz)   08/28/17 52.7 kg (116 lb 1.6 oz)   08/01/17 51.3 kg (113 lb)   07/31/17 51.4 kg (113 lb 5.1 oz)   07/28/17 51.4 kg (113 lb 5.1 oz)        Daily Estimated Nutrient Needs:   Energy: 1105-1326 kcals [25-30 kcal/kg using admission body weight, 44.2 kg (07/30/18 1203)]  Protein: 53-66 gm [1.2-1.5 gm/kg using admission body weight, 44.2 kg (07/30/18 1203)]  Carbohydrate:   [no restriction]  Fluid:   [per  MD team]     Nutrition Focused Physical Exam:  Fat Areas Examined  Orbital: Severe loss  Upper Arm: Severe loss      Muscle Areas Examined  Temple: Severe loss  Clavicle: Severe loss  Acromion: Severe loss  Dorsal Hand: Severe loss  Patellar: Severe loss  Anterior Thigh: Severe loss  Posterior Calf: Severe loss         Nutrition Evaluation  Overall Impressions: Severe fat loss;Severe muscle loss (07/30/18 1204)  Nutrition Designation: Normal weight (BMI 18.50 - 24.99 kg/m2) (07/30/18 1204)     DIAGNOSIS:  Malnutrition Assessment using AND/ASPEN Clinical Characteristics:    Severe Protein-Calorie Malnutrition in the context of chronic illness (07/30/18 1205)           Energy Intake: < or equal to 75% of estimated energy requirement for > or equal to 1 month  Interpretation of Wt. Loss: > 7.5% x 3 month  Fat Loss: Severe  Muscle Loss: Severe           Overall nutrition impression: Patient likely not meeting estimated needs PTA based on sister's account of usual intake.  Documented weight history reveals a significant 5.7kg (11.4%) in the last 3 months.  NFPE reveals severe fat/muscle loss.  Patient is at risk for refeeding syndrome as she has had poor po intake for an extended period of time.  Mg and Phos noted to be high.  Will continue to monitor intake and electrolytes daily.      GOALS:  Oral Intake:       - Patient to consume 50% of 3 meals per day.  - Patient to consume 100% of at least 1 oral supplements per day.  Laboratory Data:       - Electrolyte and renal profile will trend towards normal limits.     RECOMMENDATIONS AND INTERVENTIONS:  1.  Recommend to continue current diet as ordered: Regular  2.  Recommend Mighty Shakes TID to help support po intake  3.  Monitor lytes and replete aggressively  4.  Recommend Mg and Phos daily  5.  Continue with current bowel regimen  6.  Monitor po intake and record % meals in Epic  7.  Weekly weights    RD Follow Up Parameters:  1-2 times per week (and more frequent as indicated)     I appreciate the opportunity to participate in the care of this patient.  Please contact me with any questions.    Terrence Dupont, MS, RD, LDN  Pager: [phone]

## 2018-07-30 NOTE — Unmapped (Signed)
[facility] Medicine   History and Physical    Assessment/Plan:    Principal Problem:    Constipation  Active Problems:    Acquired hypothyroidism    Benign essential hypertension    Chronic systolic heart failure (CMS-HCC)    Raynaud's disease    Rheumatoid arthritis involving multiple joints (CMS-HCC)    Dehydration      Marilyn Fry is a 67 y.o. female with PMHx as noted below who presents to [facility] with Constipation.    1. Constipation: Large stool burden seen throughout colon and rectum, no evidence of obstruction. Patient denies taking any stool softeners, fiber or laxatives. Possibly worsened over the past few weeks by taking narcotics for her back pain.   - Bisacodyl suppository, can try enema if not effective  - Once patient is able/willing to take PO, can try golytely cleanout.    - Zofran prn    2. Acute renal failure:  Likely related to volume depletion, repeat pending. Holding ACEi, other nephrotoxic meds until improved.     3. HTN: Holding ACEi, continue low dose BB (though will reduce dose to 3.125mg  bid given soft pressures)    4. 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  - Monitor for fluid overload, but at this point she is so volume down this isnt a concern.   - Continue low dose BB, hold ACEi until creatinine normalized    5. Back pain:   - Lidoderm patch  - Tylenol 1g TID  - Avoiding opioids as able given severe constipation    6. Hypothyroidism: Synthroid daily    FEN/GI/PPX  mIVF  Advance diet as tolerated  SCDs  ___________________________________________________________________    Chief Complaint  Chief Complaint   Patient presents with   ??? Emesis       HPI:  Marilyn Fry is a 67 y.o. female with PMHx as noted below who presents to [facility] with Constipation. History is obtained mostly from family at bedside and extensive chart review, as patient is weak and somnolent and does not participate in conversation. For about a month now patient has been in and out of various hospitals, EDs, and her PCPs office for a number of complaints including mid back pain, abdominal pain, constipation, poor appetite, dizziness and nausea. In early november she was treated for a UTI, for which she completed a course of abx without any improvement in symptoms. On 11/07 she presented to [facility] with chest and back pain, and was diagnosed with NSTEMI. She was taken to [facility] and had no coronary plaques but had apical ballooning and dyskinesia, consistent with takusubo cardiomyopathy. She was discharged home the next day, though visited both [facility] ED and her cardiologist in the following days with ongoing chest pain.     She was seen at the [facility] ED on 11/22 and 11/24, diagnosed with T4 compression fracture and constipation, respectively. She was instructed to take colace and senna, though it sounds like she hasnt done this.  Her family states that she is a very light eater and has chronic constipation because she takes in so little, but over the past month she has gone for up to two weeks at a time without having a bowel movement. Currently it has been ten days, and as a result she eats and drinks even less, which only adds to the problem. She seemed to be feeling somewhat better this afternoon, but later on in the night she  tried giving herself an enema but it seems like she was unable to do it and ended up vomiting bilious fluid. Her brother decided to take her to the ED.     Allergies:  Carvedilol; Levofloxacin; Prednisone; Valtrex [valacyclovir]; Nitrofurantoin; Sulfacetamide sodium; Azithromycin; Doxycycline; Erythromycin; Guaifenesin; Nitrofurantoin macrocrystalline; Sulfa (sulfonamide antibiotics); and Tetracycline     Medications:   Prior to Admission medications    Medication Dose, Route, Frequency   oxyCODONE (ROXICODONE) 5 MG immediate release tablet 5 mg, Oral   baclofen (LIORESAL) 10 MG tablet No dose, route, or frequency recorded.   carvedilol (COREG) 6.25 MG tablet 6.25 mg, Oral, 2 times a day   cephalexin (KEFLEX) 500 MG capsule Take 1 tablet 4x/day for 7 days to treat skin infection   cetirizine (ZYRTEC) 10 MG tablet Take one tablet by mouth twice daily   clobetasol (TEMOVATE) 0.05 % ointment Apply to hands twice daily as needed   cyclobenzaprine (FLEXERIL) 5 MG tablet 5 mg, Oral   diphenhydrAMINE (BENADRYL) 25 mg capsule 25 mg, Oral, Every 6 hours PRN, As needed   docusate sodium (COLACE) 100 MG capsule 100 mg, Oral   dupilumab (DUPIXENT) 300 mg/2 mL Syrg injection INJECT THE CONTENTS OF 2 SYRINGES (600MG ) SUBCUTANEOUSLY AT WEEK 0 THEN INJECT THE CONTENTS OF 1 SYRINGE (300MG ) EVERY 2 WEEKS   leucovorin (WELLCOVORIN) 5 mg tablet Take one tablet the day after taking methotrexate   levothyroxine (SYNTHROID, LEVOTHROID) 50 MCG tablet No dose, route, or frequency recorded.   lidocaine (LIDODERM) 5 % patch 1 patch, Transdermal, Every 24 hours, Apply to affected area for 12 hours only each day (then remove patch)   lisinopril (PRINIVIL,ZESTRIL) 5 MG tablet 5 mg, Oral   methotrexate 2.5 MG tablet Take 2  tablets by mouth weekly   ondansetron (ZOFRAN) 4 MG tablet No dose, route, or frequency recorded.   pseudoephedrine (SUDAFED 12 HOUR) 120 mg 12 hr tablet 120 mg, Oral, daily   senna-docusate (PERICOLACE) 8.6-50 mg 1 tablet, Oral, Daily (standard)   traMADol (ULTRAM) 50 mg tablet 50 mg, Oral, Nightly PRN   triamcinolone (KENALOG) 0.1 % cream Apply to affected areas 2x/day until improved       Medical History:  Past Medical History:   Diagnosis Date   ??? Arthritis     likely osteoarthritis    ??? Back pain    ??? Bladder spasm    ??? Disease of thyroid gland    ??? 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 Painful urination    ??? Undifferentiated connective tissue disease (CMS-HCC)    ??? Vaginal discharge    ??? 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 Laparoscopic Bilateral oophrectomy for torsion     ??? SKIN BIOPSY     ??? TOTAL ABDOMINAL HYSTERECTOMY      Menorrhagia       Social History:  Social History     Socioeconomic History   ??? Marital status: Widowed     Spouse name: Not on file   ??? Number of children: Not on file   ??? Years of education: Not on file   ??? Highest education level: Not on file   Occupational History   ??? Not on file   Social Needs   ??? Financial resource strain: Not on file   ??? Food insecurity:     Worry: Not on file     Inability: Not on  file   ??? Transportation needs:     Medical: Not on file     Non-medical: Not on file   Tobacco Use   ??? Smoking status: Never Smoker   ??? Smokeless tobacco: Never Used   Substance and Sexual Activity   ??? Alcohol use: No   ??? Drug use: No   ??? Sexual activity: Not on file   Lifestyle   ??? Physical activity:     Days per week: Not on file     Minutes per session: Not on file   ??? Stress: Not on file   Relationships   ??? Social connections:     Talks on phone: Not on file     Gets together: Not on file     Attends religious service: Not on file     Active member of club or organization: Not on file     Attends meetings of clubs or organizations: Not on file     Relationship status: Not on file   Other Topics Concern   ??? Do you use sunscreen? No   ??? Tanning bed use? No   ??? Are you easily burned? No   ??? Excessive sun exposure? No   ??? Blistering sunburns? No   Social History Narrative    Husband passed away in July 31, 2013.       Family History:  Family History   Problem Relation Age of Onset   ??? Heart failure Mother    ??? Kidney cancer Sister    ??? Heart failure Maternal Grandmother    ??? Dementia Maternal Grandmother    ??? Heart failure Paternal Grandmother    ??? Colon cancer Maternal Aunt    ??? Arthritis Neg Hx    ??? Melanoma Neg Hx    ??? Basal cell carcinoma Neg Hx    ??? Squamous cell carcinoma Neg Hx        Review of Systems:  10 systems reviewed and are negative unless otherwise mentioned in HPI      Physical Exam:  Temp:  [35.7 ??C (96.2 ??F)] 35.7 ??C (96.2 ??F)  Heart Rate:  [72-87] 86  SpO2 Pulse:  [73-77] 77 Resp:  [12-19] 17  BP: (62-113)/(41-72) 108/64  SpO2:  [91 %-100 %] 91 %  There is no height or weight on file to calculate BMI.    GEN: Cachectic and chronically ill woman laying in bed, appears much older than stated age  EYES: EOMI, pupils equal, round, reactive to light  ENT: Dry MM, adentulous.   CV: RRR, no murmurs appreciated  PULM: clear, good air entry  ABD: soft, hypoactive bowel sounds, TTP throughout with voluntary guarding. No masses or HSM.   EXT: No edema, good pulses, cool extemities  NEURO: No focal deficits, global weakness evident    Test Results:  Recent Results (from the past 24 hour(s))   Blood Gas Critical Care Panel, Venous    Collection Time: 07/30/18 12:33 AM   Result Value Ref Range    Specimen Source Venous     FIO2 Venous Room Air     pH, Venous 7.23 (L) 7.32 - 7.43    pCO2, Ven 49 40 - 60 mm Hg    pO2, Ven 22 (L) 30 - 55 mm Hg    HCO3, Ven 17 (L) 22 - 27 mmol/L    Base Excess, Ven -7.1 (L) -2.0 - 2.0    O2 Saturation, Venous 35.9 (L) 40.0 - 85.0 %  Sodium Whole Blood 137 135 - 145 mmol/L    Potassium, Bld 5.7 (H) 3.4 - 4.6 mmol/L    Calcium, Ionized Venous 4.65 4.40 - 5.40 mg/dL    Glucose Whole Blood 127 Undefined mg/dL    Lactate, Venous 2.5 (H) 0.5 - 1.8 mmol/L    Hgb, blood gas 13.60 12.00 - 16.00 g/dL   Lipase Level    Collection Time: 07/30/18 12:33 AM   Result Value Ref Range    Lipase 263 (H) 44 - 232 U/L   Magnesium Level    Collection Time: 07/30/18 12:33 AM   Result Value Ref Range    Magnesium 2.5 (H) 1.6 - 2.2 mg/dL   Phosphorus Level    Collection Time: 07/30/18 12:33 AM   Result Value Ref Range    Phosphorus 5.1 (H) 2.9 - 4.7 mg/dL   CBC w/ Differential    Collection Time: 07/30/18 12:33 AM   Result Value Ref Range    WBC 16.7 (H) 4.5 - 11.0 10*9/L    RBC 4.97 4.00 - 5.20 10*12/L    HGB 15.1 13.5 - 16.0 g/dL    HCT 81.1 (H) 91.4 - 46.0 %    MCV 95.3 80.0 - 100.0 fL    MCH 30.4 26.0 - 34.0 pg    MCHC 31.9 31.0 - 37.0 g/dL    RDW 78.2 (H) 95.6 - 15.0 %    MPV 8.2 7.0 - 10.0 fL    Platelet 354 150 - 440 10*9/L    Neutrophils % 86.8 %    Lymphocytes % 8.2 %    Monocytes % 1.8 %    Eosinophils % 2.1 %    Basophils % 0.6 %    Neutrophil Left Shift 1+ (A) Not Present    Absolute Neutrophils 14.5 (H) 2.0 - 7.5 10*9/L    Absolute Lymphocytes 1.4 (L) 1.5 - 5.0 10*9/L    Absolute Monocytes 0.3 0.2 - 0.8 10*9/L    Absolute Eosinophils 0.4 0.0 - 0.4 10*9/L    Absolute Basophils 0.1 0.0 - 0.1 10*9/L    Large Unstained Cells 1 0 - 4 %    Macrocytosis Slight (A) Not Present    Anisocytosis Slight (A) Not Present   Troponin I    Collection Time: 07/30/18 12:33 AM   Result Value Ref Range    Troponin I <0.060 <0.060 ng/mL   PT-INR    Collection Time: 07/30/18 12:33 AM   Result Value Ref Range    PT 14.1 (H) 10.2 - 13.1 sec    INR 1.22    APTT    Collection Time: 07/30/18 12:33 AM   Result Value Ref Range    APTT 47.2 (H) 25.9 - 39.5 sec    Heparin Correlation 0.2    Comprehensive Metabolic Panel    Collection Time: 07/30/18 12:33 AM   Result Value Ref Range    Sodium 136 135 - 145 mmol/L    Potassium 4.4 3.5 - 5.0 mmol/L    Chloride 104 98 - 107 mmol/L    CO2 14.0 (L) 22.0 - 30.0 mmol/L    BUN 42 (H) 7 - 21 mg/dL    Creatinine 2.13 (H) 0.60 - 1.00 mg/dL    BUN/Creatinine Ratio 21     EGFR CKD-EPI Non-African American, Female 25 (L) >=60 mL/min/1.68m2    EGFR CKD-EPI African American, Female 28 (L) >=60 mL/min/1.32m2    Glucose 140 65 - 179 mg/dL    Calcium 9.6 8.5 - 08.6 mg/dL  Albumin 4.1 3.5 - 5.0 g/dL    Total Protein 7.3 6.5 - 8.3 g/dL    Total Bilirubin 1.3 (H) 0.0 - 1.2 mg/dL    AST 31 14 - 38 U/L    ALT 9 <35 U/L    Alkaline Phosphatase 70 38 - 126 U/L    Anion Gap 18 (H) 7 - 15 mmol/L   Type and Screen    Collection Time: 07/30/18 12:33 AM   Result Value Ref Range    Check Type Needed Type Check     Blood Type B POS     Antibody Screen NEG    POCT Glucose    Collection Time: 07/30/18 12:39 AM   Result Value Ref Range    Glucose, POC 118 65 - 179 mg/dL       Imaging: Radiology studies were personally reviewed

## 2018-07-30 NOTE — Unmapped (Signed)
Pt here from home. Per family pt has not had a bm for 10 days. Pt was told she by home nurse she needs an xray to look for blockage on tues. Today pt started having abd cramping and nausea. Pt also having vomiting. Pt at baseline mental status

## 2018-07-30 NOTE — Unmapped (Signed)
Pt updated to POC.  No needs.

## 2018-07-30 NOTE — Unmapped (Signed)
Daily Progress Note    Assessment/Plan:    Principal Problem:    Constipation  Active Problems:    Acquired hypothyroidism    Benign essential hypertension    Chronic systolic heart failure (CMS-HCC)    Raynaud's disease    Rheumatoid arthritis involving multiple joints (CMS-HCC)    Dehydration  Resolved Problems:    * No resolved hospital problems. *   Malnutrition Evaluation as performed by RD, LDN: Severe Protein-Calorie Malnutrition in the context of chronic illness (07/30/18 1205)             Marilyn Fry is a 67yoF who presented to [facility] with constipation, weight loss, FTT.  ??  FTT: Severe cachexia and mental status poor, pt disengaged  - Nutrition consult  - regular diet, supplements  - monitor electrolytes closely, risk of refeeding syndrome  - daily weights (although loss from bowel cleanout will likely match increase from volume repletion)  - consider Psychiatry consult    Constipation/FOS: Large stool burden seen throughout colon and rectum, no evidence of obstruction. Patient denies taking any stool softeners, fiber or laxatives. Possibly worsened over the past few weeks by taking narcotics for her back pain.   - bowel regimen PO/PR, effective thus far w/ 2 BMs this AM pre RN  ??  Acute renal failure, improving:  Likely related to volume depletion. Improving on repeat labs after IVF. Creat 2.04--> 1.43  - Holding ACEi, other nephrotoxic meds until improved.   ??  HTN hx, currently hypotensive:   - Holding ACEi  - continue low dose BB, reduce dose to 3.125mg  bid  ??  Leukocytosis: Afebrile. No localizing symptoms. CXR negative.  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  - Monitor for fluid overload, but at this point she is volume down  - Continue low dose BB, hold ACEi until creatinine normalized  ??  Back pain:   - Lidoderm patch  - Tylenol 1g TID  - Avoiding opioids as able given severe constipation  ??  Hypothyroidism: Synthroid daily. TSH normal.   ??  FEN/GI/PPX  mIVF  Advance diet as tolerated  enox 30  ___________________________________________________________________    Subjective:  Pt fairly nonverbal   Discussed w/ RN and Nutrition consult, note reviewed from this encounter w/ sister     Recent Results (from the past 24 hour(s))   ECG 12 Lead    Collection Time: 07/29/18 11:57 PM   Result Value Ref Range    EKG Systolic BP  mmHg    EKG Diastolic BP  mmHg    EKG Ventricular Rate 72 BPM    EKG Atrial Rate 72 BPM    EKG P-R Interval 166 ms    EKG QRS Duration 90 ms    EKG Q-T Interval 466 ms    EKG QTC Calculation 510 ms    EKG Calculated P Axis 62 degrees    EKG Calculated R Axis -18 degrees    EKG Calculated T Axis 166 degrees    QTC Fredericia 495 ms   Blood Gas Critical Care Panel, Venous    Collection Time: 07/30/18 12:33 AM   Result Value Ref Range    Specimen Source Venous     FIO2 Venous Room Air     pH, Venous 7.23 (L) 7.32 - 7.43    pCO2, Ven 49 40 - 60 mm Hg    pO2, Ven 22 (L) 30 -  55 mm Hg    HCO3, Ven 17 (L) 22 - 27 mmol/L    Base Excess, Ven -7.1 (L) -2.0 - 2.0    O2 Saturation, Venous 35.9 (L) 40.0 - 85.0 %    Sodium Whole Blood 137 135 - 145 mmol/L    Potassium, Bld 5.7 (H) 3.4 - 4.6 mmol/L    Calcium, Ionized Venous 4.65 4.40 - 5.40 mg/dL    Glucose Whole Blood 127 Undefined mg/dL    Lactate, Venous 2.5 (H) 0.5 - 1.8 mmol/L    Hgb, blood gas 13.60 12.00 - 16.00 g/dL   Lipase Level    Collection Time: 07/30/18 12:33 AM   Result Value Ref Range    Lipase 263 (H) 44 - 232 U/L   Magnesium Level    Collection Time: 07/30/18 12:33 AM   Result Value Ref Range    Magnesium 2.5 (H) 1.6 - 2.2 mg/dL   Phosphorus Level    Collection Time: 07/30/18 12:33 AM   Result Value Ref Range    Phosphorus 5.1 (H) 2.9 - 4.7 mg/dL   CBC w/ Differential    Collection Time: 07/30/18 12:33 AM   Result Value Ref Range    WBC 16.7 (H) 4.5 - 11.0 10*9/L    RBC 4.97 4.00 - 5.20 10*12/L    HGB 15.1 13.5 - 16.0 g/dL    HCT 16.1 (H) 09.6 - 46.0 %    MCV 95.3 80.0 - 100.0 fL    MCH 30.4 26.0 - 34.0 pg    MCHC 31.9 31.0 - 37.0 g/dL    RDW 04.5 (H) 40.9 - 15.0 %    MPV 8.2 7.0 - 10.0 fL    Platelet 354 150 - 440 10*9/L    Neutrophils % 86.8 %    Lymphocytes % 8.2 %    Monocytes % 1.8 %    Eosinophils % 2.1 %    Basophils % 0.6 %    Neutrophil Left Shift 1+ (A) Not Present    Absolute Neutrophils 14.5 (H) 2.0 - 7.5 10*9/L    Absolute Lymphocytes 1.4 (L) 1.5 - 5.0 10*9/L    Absolute Monocytes 0.3 0.2 - 0.8 10*9/L    Absolute Eosinophils 0.4 0.0 - 0.4 10*9/L    Absolute Basophils 0.1 0.0 - 0.1 10*9/L    Large Unstained Cells 1 0 - 4 %    Macrocytosis Slight (A) Not Present    Anisocytosis Slight (A) Not Present   Troponin I    Collection Time: 07/30/18 12:33 AM   Result Value Ref Range    Troponin I <0.060 <0.060 ng/mL   PT-INR    Collection Time: 07/30/18 12:33 AM   Result Value Ref Range    PT 14.1 (H) 10.2 - 13.1 sec    INR 1.22    APTT    Collection Time: 07/30/18 12:33 AM   Result Value Ref Range    APTT 47.2 (H) 25.9 - 39.5 sec    Heparin Correlation 0.2    Comprehensive Metabolic Panel    Collection Time: 07/30/18 12:33 AM   Result Value Ref Range    Sodium 136 135 - 145 mmol/L    Potassium 4.4 3.5 - 5.0 mmol/L    Chloride 104 98 - 107 mmol/L    CO2 14.0 (L) 22.0 - 30.0 mmol/L    BUN 42 (H) 7 - 21 mg/dL    Creatinine 8.11 (H) 0.60 - 1.00 mg/dL    BUN/Creatinine Ratio 21  EGFR CKD-EPI Non-African American, Female 25 (L) >=60 mL/min/1.52m2    EGFR CKD-EPI African American, Female 28 (L) >=60 mL/min/1.91m2    Glucose 140 65 - 179 mg/dL    Calcium 9.6 8.5 - 45.4 mg/dL    Albumin 4.1 3.5 - 5.0 g/dL    Total Protein 7.3 6.5 - 8.3 g/dL    Total Bilirubin 1.3 (H) 0.0 - 1.2 mg/dL    AST 31 14 - 38 U/L    ALT 9 <35 U/L    Alkaline Phosphatase 70 38 - 126 U/L    Anion Gap 18 (H) 7 - 15 mmol/L   Type and Screen    Collection Time: 07/30/18 12:33 AM   Result Value Ref Range    Check Type Needed Type Check     Blood Type B POS     Antibody Screen NEG    POCT Glucose Collection Time: 07/30/18 12:39 AM   Result Value Ref Range    Glucose, POC 118 65 - 179 mg/dL   Basic metabolic panel    Collection Time: 07/30/18  4:14 AM   Result Value Ref Range    Sodium 138 135 - 145 mmol/L    Potassium 3.8 3.5 - 5.0 mmol/L    Chloride 112 (H) 98 - 107 mmol/L    CO2 16.0 (L) 22.0 - 30.0 mmol/L    Anion Gap 10 7 - 15 mmol/L    BUN 38 (H) 7 - 21 mg/dL    Creatinine 0.98 (H) 0.60 - 1.00 mg/dL    BUN/Creatinine Ratio 27     EGFR CKD-EPI Non-African American, Female 38 (L) >=60 mL/min/1.24m2    EGFR CKD-EPI African American, Female 44 (L) >=60 mL/min/1.31m2    Glucose 110 65 - 179 mg/dL    Calcium 7.8 (L) 8.5 - 10.2 mg/dL   Lactic Acid, Venous, Whole Blood    Collection Time: 07/30/18  4:14 AM   Result Value Ref Range    Lactate, Venous 1.0 0.5 - 1.8 mmol/L   TSH    Collection Time: 07/30/18  4:14 AM   Result Value Ref Range    TSH 1.678 0.600 - 3.300 uIU/mL   T4, Free    Collection Time: 07/30/18  4:14 AM   Result Value Ref Range    Free T4 2.06 (H) 0.71 - 1.40 ng/dL     Labs/Studies:  Labs and Studies from the last 24hrs per EMR and Reviewed    Objective:  Temp:  [35.7 ??C (96.2 ??F)-36.3 ??C (97.3 ??F)] 36.1 ??C (97 ??F)  Heart Rate:  [72-88] 80  SpO2 Pulse:  [73-94] 94  Resp:  [12-20] 18  BP: (62-115)/(41-72) 115/65  SpO2:  [91 %-100 %] 100 %  Gen: cachectic, lying on back in bed, minimally interactive  ENT: dry MM  CV: RRR  Pulm: CTAB, normal WOB  Abd: hypoactive bowel sounds, soft, nontend  Ext: no edema

## 2018-07-30 NOTE — Unmapped (Signed)
hospitalist at bedside

## 2018-07-30 NOTE — Unmapped (Signed)
[facility]  Emergency Department Provider Note    ED Clinical Impression     Final diagnoses:   Vomiting, intractability of vomiting not specified, presence of nausea not specified, unspecified vomiting type (Primary)   Nausea and vomiting, intractability of vomiting not specified, unspecified vomiting type   Abdominal pain, unspecified abdominal location   Hypotension, unspecified hypotension type   Dehydration   AKI (acute kidney injury) (CMS-HCC)       Initial Impression, ED Course, Assessment and Plan     Marilyn Fry is a 67 y.o. female w/hx of chronic back pain, hypothyroidism, CHF, HTN, HLD, rheumatoid arthritis, and Raynaud's disease who presents to the ED hypotensive, lethargic and cachectic appearing, able to answer questions but very tired.  She appears very dehydrated, abdomen is not sniffily distended but she is diffusely tender and moaning with even the lightest palpation.  No fevers, lungs are clear, very cold extremities concerning for possible shock.  At this time most concern for obstruction given her reports of bilious vomiting, dehydration and hypovolemia certainly could be a cause of her hypotension, infection, less likely cardiogenic shock.  At this time will check bedside ultrasound, portable x-ray for any evidence of free air and likely will need a CT scan.  Will start with 2 L of fluids and draw lab work as well as an EKG.    Additional Medical Decision Making     I have reviewed the vital signs and the nursing notes. Labs and radiology results that were available during my care of the patient were independently reviewed by me and considered in my medical decision making.     I staffed the case with the ED attending, Dr. Norlene Campbell.    12:56 AM very difficult views on bedside ultrasound, portable x-rays does not show any free air but does appear to show significant stool burden possibly even in the stomach.  Awaiting lab work will send to CT scanner when creatinine is back, she already received 1 L of fluid, starting a second, blood pressure is currently improving but she is still lethargic.  Spoke with patient's family who do believe that she would want to be full code at this time. Mild metabolic acidosis with lactate 2.5, leukocytosis, given her hypotension and abd pain will cover emperically with Zosyn. Pt signed out to Dr. Norlene Campbell, awaiting CT scan prior to disposition.       Portions of this record have been created using Scientist, clinical (histocompatibility and immunogenetics). Dictation errors have been sought, but may not have been identified and corrected.  ____________________________________________       History     Chief Complaint  Emesis      HPI   Marilyn Fry is a 67 y.o. female with a PMH of chronic back pain, hypothyroidism, CHF, HTN, HLD, rheumatoid arthritis, and Raynaud's disease who presents to the ED with 10 days of constipation and vomiting tonight. Patient's family reports that the patient has not had a BM over the past 10 days. He states that the patient's home health nurse advised the patient to come to the ED but the patient initially refused. He reports that the patient instead attempted to give herself an enema around 1 hour ago but has been vomiting since this time. Patient reports that she has had green vomitus. She states that she has also had abdominal cramping and nausea today. She endorses lightheadedness and back pain. She states that she is unsure if she passed out today but feels  very lightheaded currently. Denies chest pain, shortness of breath, fevers, dysuria, or other concerning symptoms. Patient's family reports that the patient has had intermittent vomiting for the past few weeks for which she has been seen in multiple EDs and given multiple different diagnoses.    Per review of Care Everywhere, patient had a recent Echo on 07/03/18 with impression: Left ventricle: Systolic function was normal. The estimated  ????ejection fraction was in the range of 55% to 65%. Akinesis of the  ????apical myocardium.        Past Medical History:   Diagnosis Date   ??? Arthritis     likely osteoarthritis    ??? Back pain    ??? Bladder spasm    ??? Disease of thyroid gland    ??? 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 Painful urination    ??? Undifferentiated connective tissue disease (CMS-HCC)    ??? Vaginal discharge    ??? 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 Arthritis   ??? Ejection fraction < 50%   ??? Disease of thyroid gland   ??? Undifferentiated connective tissue disease (CMS-HCC)   ??? Vaginal vault prolapse   ??? Acquired hypothyroidism   ??? Atopic dermatitis   ??? Benign essential hypertension   ??? Congestive cardiomyopathy (CMS-HCC)   ??? Chest pain   ??? Chronic systolic heart failure (CMS-HCC)   ??? Eczema   ??? Essential hypertension   ??? Hyperlipidemia   ??? Multiple-type hyperlipidemia   ??? Raynaud's disease   ??? Rheumatoid arthritis involving multiple joints (CMS-HCC)   ??? Diffuse connective tissue disease (CMS-HCC)   ??? Shingles (herpes zoster) polyneuropathy   ??? Bladder spasm   ??? Vaginal atrophy   ??? Dehydration   ??? Constipation       Past Surgical History:   Procedure Laterality Date   ??? Laparoscopic Bilateral oophrectomy for torsion     ??? SKIN BIOPSY     ??? TOTAL ABDOMINAL HYSTERECTOMY      Menorrhagia         Current Facility-Administered Medications:   ???  acetaminophen (TYLENOL) tablet 1,000 mg, 1,000 mg, Oral, Q8H, Peter Garter, MD, 1,000 mg at 07/30/18 1610  ???  bisacodyl (DULCOLAX) suppository 10 mg, 10 mg, Rectal, Daily PRN, Lillia Carmel, MD  ???  carvedilol (COREG) tablet 3.125 mg, 3.125 mg, Oral, BID, Peter Garter, MD, 3.125 mg at 07/30/18 0900  ???  enoxaparin (LOVENOX) syringe 30 mg, 30 mg, Subcutaneous, Q24H SCH, Beth Judy Pimple, MD  ???  influenza vaccine quad (FLUARIX, FLULAVAL, FLUZONE) (6 MOS & UP) 2019-20, 0.5 mL, Intramuscular, During hospitalization, Lillia Carmel, MD  ???  lactated Ringers infusion, 100 mL/hr, Intravenous, Continuous, Lillia Carmel, MD, Last Rate: 100 mL/hr at 07/30/18 0858, 100 mL/hr at 07/30/18 0858  ???  levothyroxine (SYNTHROID) tablet 50 mcg, 50 mcg, Oral, daily, Peter Garter, MD, 50 mcg at 07/30/18 0604  ???  lidocaine (LIDODERM) 5 % patch 1 patch, 1 patch, Transdermal, Q24H, Peter Garter, MD, 1 patch at 07/30/18 [phone]  ???  pneumococcal polysacchride (23-valps) (PNEUMOVAX) vaccine 0.5 mL, 0.5 mL, Subcutaneous, During hospitalization, Lillia Carmel, MD  ???  polyethylene glycol (MIRALAX) packet 17 g, 17 g, Oral, TID, Lillia Carmel, MD, 17 g at 07/30/18 0900    Allergies  Carvedilol; Levofloxacin; Prednisone; Valtrex [valacyclovir]; Nitrofurantoin; Sulfacetamide sodium; Azithromycin; Doxycycline; Erythromycin; Guaifenesin; Nitrofurantoin macrocrystalline; Sulfa (sulfonamide antibiotics); and  Tetracycline    Family History   Problem Relation Age of Onset   ??? Heart failure Mother    ??? Kidney cancer Sister    ??? Heart failure Maternal Grandmother    ??? Dementia Maternal Grandmother    ??? Heart failure Paternal Grandmother    ??? Colon cancer Maternal Aunt    ??? Arthritis Neg Hx    ??? Melanoma Neg Hx    ??? Basal cell carcinoma Neg Hx    ??? Squamous cell carcinoma Neg Hx        Social History  Social History     Tobacco Use   ??? Smoking status: Never Smoker   ??? Smokeless tobacco: Never Used   Substance Use Topics   ??? Alcohol use: No   ??? Drug use: No       Review of Systems    Constitutional: Negative for fever.  Eyes: Negative for visual changes.  ENT: Negative for sore throat.  Cardiovascular: Negative for chest pain.  Respiratory: Negative for shortness of breath.  Gastrointestinal: Positive for constipation, vomiting, and abdominal pain  Genitourinary: Negative for dysuria.  Musculoskeletal: Positive for back pain.  Skin: Negative for rash.  Neurological: Negative for headaches      Physical Exam     ED Triage Vitals [07/29/18 2341]   Enc Vitals Group      BP 62/41      Heart Rate 72      SpO2 Pulse       Resp       Temp 35.7 ??C (96.2 ??F)      Temp Source Oral      SpO2        Constitutional: Lethargic, acute on chronically ill, cachectic. Very frail appearing. She is oriented to person and time. She will follow basic commands. Moaning throughout exam.  Eyes: Conjunctivae are normal.  ENT       Head: Normocephalic       Nose: No congestion.       Mouth/Throat: Mucous membranes are dry.       Neck: No stridor.  Hematological/Lymphatic/Immunilogical: No cervical lymphadenopathy.  Cardiovascular: RRR  Respiratory: Decreased breath sounds. Normal respiratory effort.  Gastrointestinal: Abdomen diffusely tender.  Not distended but exam concerning for peritonitis  Musculoskeletal: Very cold extremities but palpable pulses. No deformities, no lower extremity edema.   Neurologic: Moving all of her extremities, no focal deficits  Skin: Dry, no open wounds  Psychiatric: Mood and affect are normal.       EKG     Rate: 72  Rhythm: NSR  QTc: 510  Impression: Normal sinus rhythm, inverted T waves anterior laterally but actually improved from prior      Radiology     CT Abdomen Pelvis Wo Contrast   Final Result   -Wall thickening of the distal colon and descending colon with associated pericolonic stranding concerning for long segment colitis however evaluation is limited in the absence of IV contrast.   -Large colonic and rectal stool burden.   -Diffuse mesenteric edema with trace free fluid.   -Additional chronic changes as above.             ====================   ATTENDING ADDENDUM (07/30/2018 7:47 AM):       - Overall agree with above findings. There is a very large stool burden particularly in the transverse colon and near the splenic flexure. This is associated with findings of colitis, and early stercoral colitis is possible. An aggressive bowel regimen  is suggested.      - Unchanged dilatation of the first portion of the duodenum tapering to normal caliber across the second portion of the duodenum. No definite evidence for obstruction.      XR Chest 1 view Portable   Final Result      No acute airspace disease.      XR Abdomen 1 View   Final Result   Partially imaged abdomen.   Nonobstructive gas pattern. Moderate stool burden.      ED POCUS [employer] Protocol Emergency   Final Result          Documentation assistance was provided by Pamala Duffel, Scribe, on July 29, 2018 at 11:47 PM for Dorise Hiss, MD.    July 29, 2018 11:46 PM. Documentation assistance provided by the scribe. I was present during the time the encounter was recorded. The information recorded by the scribe was done at my direction and has been reviewed and validated by me.      Retia Passe, MD  Resident  07/30/18 [phone]

## 2018-07-30 NOTE — Unmapped (Signed)
Patient transported to CT Scan  Transported by Radiology  How tranported Stretcher  Cardiac Monitor yes

## 2018-07-31 DIAGNOSIS — R109 Unspecified abdominal pain: Principal | ICD-10-CM

## 2018-07-31 LAB — COMPREHENSIVE METABOLIC PANEL
ALBUMIN: 2.2 g/dL — ABNORMAL LOW (ref 3.5–5.0)
ALKALINE PHOSPHATASE: 41 U/L (ref 38–126)
ALT (SGPT): 8 U/L (ref <35)
ANION GAP: 8 mmol/L (ref 7–15)
AST (SGOT): 22 U/L (ref 14–38)
BILIRUBIN TOTAL: 0.6 mg/dL (ref 0.0–1.2)
BLOOD UREA NITROGEN: 39 mg/dL — ABNORMAL HIGH (ref 7–21)
BUN / CREAT RATIO: 39
CALCIUM: 7.8 mg/dL — ABNORMAL LOW (ref 8.5–10.2)
CO2: 20 mmol/L — ABNORMAL LOW (ref 22.0–30.0)
CREATININE: 1.01 mg/dL — ABNORMAL HIGH (ref 0.60–1.00)
EGFR CKD-EPI AA FEMALE: 67 mL/min/{1.73_m2} (ref >=60)
EGFR CKD-EPI NON-AA FEMALE: 58 mL/min/{1.73_m2} — ABNORMAL LOW (ref >=60)
GLUCOSE RANDOM: 96 mg/dL (ref 65–179)
PROTEIN TOTAL: 4.6 g/dL — ABNORMAL LOW (ref 6.5–8.3)
SODIUM: 139 mmol/L (ref 135–145)

## 2018-07-31 LAB — CBC
HEMOGLOBIN: 12.1 g/dL — ABNORMAL LOW (ref 13.5–16.0)
MEAN CORPUSCULAR HEMOGLOBIN CONC: 33.8 g/dL (ref 31.0–37.0)
MEAN CORPUSCULAR VOLUME: 93.3 fL (ref 80.0–100.0)
MEAN PLATELET VOLUME: 7.6 fL (ref 7.0–10.0)
PLATELET COUNT: 277 10*9/L (ref 150–440)
RED BLOOD CELL COUNT: 3.85 10*12/L — ABNORMAL LOW (ref 4.00–5.20)
RED CELL DISTRIBUTION WIDTH: 16.2 % — ABNORMAL HIGH (ref 12.0–15.0)
WBC ADJUSTED: 12.9 10*9/L — ABNORMAL HIGH (ref 4.5–11.0)

## 2018-07-31 LAB — MAGNESIUM: Magnesium:MCnc:Pt:Ser/Plas:Qn:: 1.9

## 2018-07-31 LAB — VITAMIN B-12: Cobalamins:MCnc:Pt:Ser/Plas:Qn:: 710

## 2018-07-31 LAB — PHOSPHORUS: Phosphate:MCnc:Pt:Ser/Plas:Qn:: 3.5

## 2018-07-31 LAB — MEAN PLATELET VOLUME: Lab: 7.6

## 2018-07-31 LAB — AST (SGOT): Aspartate aminotransferase:CCnc:Pt:Ser/Plas:Qn:: 22

## 2018-07-31 MED ORDER — POLYETHYLENE GLYCOL 3350 17 G PO PACK
17.00 | PACK | ORAL | Status: DC
Start: 2018-07-31 — End: 2018-07-31

## 2018-07-31 MED ORDER — LEVOTHYROXINE SODIUM 50 MCG PO TABS
50.00 | ORAL_TABLET | ORAL | Status: DC
Start: 2018-08-05 — End: 2018-07-31

## 2018-07-31 MED ORDER — CARVEDILOL 3.125 MG PO TABS
3.13 | ORAL_TABLET | ORAL | Status: DC
Start: 2018-07-31 — End: 2018-07-31

## 2018-07-31 MED ORDER — PNEUMOCOCCAL VAC POLYVALENT 25 MCG/0.5ML IJ INJ
0.50 | INJECTION | INTRAMUSCULAR | Status: DC
Start: ? — End: 2018-07-31

## 2018-07-31 MED ORDER — INFLUENZA VAC SPLIT QUAD 0.5 ML IM SUSY
0.50 | PREFILLED_SYRINGE | INTRAMUSCULAR | Status: DC
Start: ? — End: 2018-07-31

## 2018-07-31 MED ORDER — LIDOCAINE 5 % EX PTCH
1.00 | MEDICATED_PATCH | CUTANEOUS | Status: DC
Start: 2018-08-05 — End: 2018-07-31

## 2018-07-31 MED ORDER — BISACODYL 10 MG RE SUPP
10.00 | RECTAL | Status: DC
Start: ? — End: 2018-07-31

## 2018-07-31 MED ORDER — ENOXAPARIN SODIUM 30 MG/0.3ML ~~LOC~~ SOLN
30.00 | SUBCUTANEOUS | Status: DC
Start: 2018-08-05 — End: 2018-07-31

## 2018-07-31 MED ORDER — ACETAMINOPHEN 500 MG PO TABS
1000.00 | ORAL_TABLET | ORAL | Status: DC
Start: 2018-07-31 — End: 2018-07-31

## 2018-07-31 MED ORDER — LACTATED RINGERS IV SOLN
100.00 | INTRAVENOUS | Status: DC
Start: ? — End: 2018-07-31

## 2018-07-31 NOTE — Unmapped (Signed)
Alert and oriented x4. Vss; afebrile. Flat affect. Encouraging oral intake. Monitoring intake and output. IV fluids infusing per order. Family at bedside. All needs met. Will continue to monitor.  Problem: Adult Inpatient Plan of Care  Goal: Plan of Care Review  Outcome: Progressing  Goal: Patient-Specific Goal (Individualization)  Outcome: Progressing  Goal: Absence of Hospital-Acquired Illness or Injury  Outcome: Progressing  Goal: Optimal Comfort and Wellbeing  Outcome: Progressing  Goal: Readiness for Transition of Care  Outcome: Progressing  Goal: Rounds/Family Conference  Outcome: Progressing

## 2018-07-31 NOTE — Unmapped (Signed)
PHYSICAL THERAPY  Evaluation (07/31/18 1105)     Patient Name:  Marilyn Fry       Medical Record Number: [id number]   Date of Birth: 11/07/1950  Sex: Female            Treatment Diagnosis: Impaired mobility    ASSESSMENT    67yoF who [facility] with constipation, weight loss, FTT. Pt presents w/ decreased funcitonal mobility and safety.  Pt able to amb w/ RW 120' CGA, pt also amb without RW, HHA (CGA to min A)- periodic scissoring gait w/ L LE during amb.  Pt demonstrates improved amb ability, balance and safety when amb w/ RW.  Pt transfers sit <> stnd to RW SBA and sup to sit Independent.  Pt will benefit from continued skilled acute care to address pts deficits and goals.  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      Today's Interventions: PT Eval.  Pt performed bed mobility, transfers, balance, and gait training. Pt education for safety, precautions and progressive mobility.     Activity Tolerance: Patient tolerated treatment well;Patient limited by fatigue    PLAN  Planned Frequency of Treatment:  1-2x per day for: 5-6x week      Planned Interventions: Airway Clearance;Balance activities;Diaphragmatic / Pursed-lip breathing;Education - Patient;Education - Family / caregiver;Home exercise program;Gait training;Functional mobility;Postural re-education;Self-care / Home training;Stair training;Therapeutic exercise;Therapeutic activity;Transfer training    Post-Discharge Physical Therapy Recommendations:  5x weekly;High intensity        PT DME Recommendations: Defer to post acute(Pt reports she has RW.)     Goals:   Patient and Family Goals: To get back to helping care for her Mother.     Long Term Goal #1: Pt will being an Independent comm. amb. 4 weeks from today (07/31/18)       SHORT GOAL #1: Pt will transfer sit <> stand to LRAD Mod I.              Time Frame : 2 weeks  SHORT GOAL #2: Pt will amb w/ LRAD 250' Mod I.               Time Frame : 2 weeks  SHORT GOAL #3: Pt will go up/down 2 steps w/ one rail and LRAD Mod I.               Time Frame : 2 weeks                                        Prognosis:  Good  Barriers to Discharge: Decreased caregiver support;Gait instability;Impaired balance;Functional strength deficits;Endurance deficits;Inaccessible home environment  Positive Indicators: PLOF, Motivation.     SUBJECTIVE  Patient reports: Pt agreeable to PT, reports feeling weaker and off balance more so than normal.  Did better w/ RW for amb. vs without.   Current Functional Status: Pt in bed when PT arrived and left resting in chair all needs met.  Call bell in hand. RN aware.      Prior functional status: Pt reports Independent, community amb and Drives.  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   Equipment available at home: Shower chair;Rolling Dan Humphreys    Past Medical History:   Diagnosis Date   ??? Arthritis  likely osteoarthritis    ??? Back pain    ??? Bladder spasm    ??? Disease of thyroid gland    ??? 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 Painful urination    ??? Undifferentiated connective tissue disease (CMS-HCC)    ??? Vaginal discharge    ??? 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 Smoking status: Never Smoker   ??? Smokeless tobacco: Never Used   Substance Use Topics   ??? Alcohol use: No      Past Surgical History:   Procedure Laterality Date   ??? Laparoscopic Bilateral oophrectomy for torsion     ??? SKIN BIOPSY     ??? TOTAL ABDOMINAL HYSTERECTOMY      Menorrhagia    Family History   Problem Relation Age of Onset   ??? Heart failure Mother    ??? Kidney cancer Sister    ??? Heart failure Maternal Grandmother    ??? Dementia Maternal Grandmother    ??? Heart failure Paternal Grandmother    ??? Colon cancer Maternal Aunt    ??? Arthritis Neg Hx    ??? Melanoma Neg Hx    ??? Basal cell carcinoma Neg Hx    ??? Squamous cell carcinoma Neg Hx         Allergies: Carvedilol; Levofloxacin; Prednisone; Valtrex [valacyclovir]; Nitrofurantoin; Sulfacetamide sodium; Azithromycin; Doxycycline; Erythromycin; Guaifenesin; Nitrofurantoin macrocrystalline; Sulfa (sulfonamide antibiotics); and Tetracycline                Objective Findings              Precautions: Aspiration precautions;Isolation precautions              Weight Bearing Status: Non- applicable              Required Braces or Orthoses: Non- applicable    Communication Preference: Verbal  Pain Comments: No c/o pain  Medical Tests / Procedures: Reviewed H&P, orders.   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  Currently an out of town Sister is staying w/ the Mother but she is typically in town only one week a month.  Pt has 3 other siblings but they work.)  Home Living: Walk-in shower;Shower chair with back;Two level home;Able to Live on main level with bedroom/bathroom;Stairs to enter with rails  Rail placement (outside): Rail on right side     Number of Stairs: 2    Cognition: A&O x 4.  Worried about caring for her Mother.   Visual / Perception Status: Intact  Skin Inspection: CDI visible areas    UE ROM: WFL  UE Strength: WFL  LE ROM: WFL  LE Strength: WFL                Coordination: Fair      Sensation: Grossly intact  Balance: Sitting Independent, standing SBA, standing w/ RW supervision.   Posture: Forward head and increased kyphosis.      Bed Mobility: Sup to sit Independent.  Transfers: Sit <> stand to RW SBA, Sit <> stand without RW CGA.   Gait: Pt amb w/ RW 120' CGA, pt also amb  without RW, HHA (CGA to min A)- periodic scissoring gait w/ L LE during amb.  Pt demonstrates improved amb ability, balance and safety when amb w/ RW. Stairs: NT      Endurance: Fair    Eval Duration(PT): 31 Min.    Medical Staff Made Aware: RN     I attest that I have reviewed the above information.  Signed: Shannan Harper, PT  Filed 07/31/2018

## 2018-07-31 NOTE — Unmapped (Signed)
Pt alert and oriented x4. Voiced no pain this shift. Pt in NAD, VSS. Pt requires stand by assist with ambulation, using walker. Bowel regimen continued. Noted bm this morning. Calorie count continued per order. Pt reports poor appetite, but tolerating supplement. SW consult placed for complicated discharge. Psych eval placed as well.       Problem: Adult Inpatient Plan of Care  Goal: Plan of Care Review  Outcome: Progressing  Goal: Patient-Specific Goal (Individualization)  Outcome: Progressing  Goal: Absence of Hospital-Acquired Illness or Injury  Outcome: Progressing  Goal: Optimal Comfort and Wellbeing  Outcome: Progressing  Goal: Readiness for Transition of Care  Outcome: Progressing  Goal: Rounds/Family Conference  Outcome: Progressing     Problem: Infection  Goal: Infection Symptom Resolution  Outcome: Progressing     Problem: Skin Injury Risk Increased  Goal: Skin Health and Integrity  Outcome: Progressing     Problem: Self-Care Deficit  Goal: Improved Ability to Complete Activities of Daily Living  Outcome: Progressing     Problem: Fall Injury Risk  Goal: Absence of Fall and Fall-Related Injury  Outcome: Progressing     Problem: Heart Failure Comorbidity  Goal: Maintenance of Heart Failure Symptom Control  Outcome: Progressing

## 2018-07-31 NOTE — Unmapped (Signed)
Urine specimen collected per orders via in and out cath. Urine was brown in color. Patient had 2 liquid stools. Pt. Work with PT and OT. Sister at bedside. Calorie count on door.     Problem: Adult Inpatient Plan of Care  Goal: Absence of Hospital-Acquired Illness or Injury  Outcome: Ongoing - Unchanged  Intervention: Identify and Manage Fall Risk  Flowsheets (Taken 07/30/2018 1800)  Safety Interventions: isolation precautions;fall reduction program maintained;low bed  Intervention: Prevent Skin Injury  Flowsheets (Taken 07/30/2018 1200 by Felicity Coyer, CNA)  Pressure Reduction Techniques: frequent weight shift encouraged  Intervention: Prevent VTE (venous thromboembolism)  Flowsheets (Taken 07/30/2018 1937)  VTE Prevention/Management: fluids promoted; intravenous hydration  Intervention: Prevent Infection  Flowsheets (Taken 07/30/2018 1937)  Infection Prevention: single patient room provided; handwashing promoted; personal protective equipment utilized  Goal: Optimal Comfort and Wellbeing  Outcome: Ongoing - Unchanged  Intervention: Monitor Pain and Promote Comfort  Flowsheets (Taken 07/30/2018 1937)  Pain Management Interventions: care clustered; position adjusted  Intervention: Provide Person-Centered Care  Flowsheets (Taken 07/30/2018 1937)  Trust Relationship/Rapport: care explained; choices provided; empathic listening provided; questions encouraged; thoughts/feelings acknowledged     Problem: Fall Injury Risk  Goal: Absence of Fall and Fall-Related Injury  Outcome: Ongoing - Unchanged  Intervention: Identify and Manage Contributors to Fall Injury Risk  Flowsheets (Taken 07/30/2018 1937)  Medication Review/Management: medications reviewed  Self-Care Promotion: independence encouraged; meal setup provided; BADL personal objects within reach     Problem: Heart Failure Comorbidity  Goal: Maintenance of Heart Failure Symptom Control  Outcome: Ongoing - Unchanged  Intervention: Maintain Heart Failure-Management Strategies  Flowsheets (Taken 07/30/2018 1937)  Medication Review/Management: medications reviewed

## 2018-07-31 NOTE — Unmapped (Signed)
Care Management  Initial Transition Planning Assessment    Per H&P: Marilyn Fry is a 67yoF who presented to [facility] with constipation, weight loss, FTT.  ??  FTT: Severe cachexia and mental status poor, pt disengaged  - Nutrition consult  - regular diet, supplements  - monitor electrolytes closely, risk of refeeding syndrome  - daily weights (although loss from bowel cleanout will likely match increase from volume repletion)  - consider Psychiatry consult              General  Care Manager assessed the patient by : In person interview with patient  Orientation Level: Oriented X4  Who provides care at home?: N/A  Reason for referral: Discharge Planning   Type of Residence: Mailing Address:  [address]  [place] [address]  Contacts: Extended Emergency Contact Information  Primary Emergency Contact: Drexel Iha [place]  Work Phone: [phone]  Relation: Sister  Secondary Emergency Contact: Riki Sheer  Mobile Phone: [phone]  Relation: Sister  Patient Phone Number: [phone] (home)         Medical Provider(s): Reubin Milan, MD  Reason for Admission: Admitting Diagnosis:  Dehydration [E86.0]  AKI (acute kidney injury) (CMS-HCC) [N17.9]  Abdominal pain, unspecified abdominal location [R10.9]  Hypotension, unspecified hypotension type [I95.9]  Vomiting, intractability of vomiting not specified, presence of nausea not specified, unspecified vomiting type [R11.10]  Nausea and vomiting, intractability of vomiting not specified, unspecified vomiting type [R11.2]  Past Medical History:   has a past medical history of Arthritis, Back pain, Bladder spasm, Disease of thyroid gland, Ejection fraction < 50%, Painful urination, Undifferentiated connective tissue disease (CMS-HCC), Vaginal discharge, and 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  Past Surgical History:   has a past surgical history that includes Total abdominal hysterectomy; Laparoscopic Bilateral oophrectomy for torsion; and Skin biopsy.   Previous admit date: 02/23/2015    Primary Insurance- Payor: Advertising copywriter MEDICARE ADV / Plan: UNITED HEALTHCARE MEDICARE ADV / Product Type: *No Product type* /   Secondary Insurance ??? None  Prescription Coverage ??? same as above  Preferred Pharmacy - [facility] [place], [place] - [address]  [facility]    Transportation home: Private vehicle  Level of function prior to admission: Requires Assistance     Contact/Decision Maker  Extended Emergency Contact Information  Primary Emergency Contact: Isley,Kathy   [place]  Work Phone: [phone]  Relation: Sister    Armed forces operational officer Next of Kin / Guardian / POA / Advance Directives     Advance Directive (Medical Treatment)  Does patient have an advance directive covering medical treatment?: Patient does not have advance directive covering medical treatment.  Reason patient does not have an advance directive covering medical treatment:: Patient needs follow-up to complete one.(Pt designates her sister Riki Sheer (lives in [place], [place]) to be her Runner, broadcasting/film/video.)  Information provided on advance directive:: No  Patient requests assistance:: No    Patient Information  Lives with: Alone    Type of Residence: Private residence     Location/Detail: [place], [place]    Support Systems: Family Members, Friends/Neighbors    Responsibilities/Dependents at home?: Other (Comment)(Pt used to take care of her mom. She would stay at her mom's house at night.Marland Kitchen)    Home Care services in place prior to admission?: Yes  Type of Home Care services in place prior to admission: Home PT, Home nursing visits, Home OT, Home health  aide, Other (Comment)(SW)  Current Home Care provider (Name/Phone #): [facility]    Equipment Currently Used at Home: other (see comments)(Pt has been too weak to be out of bed.)     Currently receiving outpatient dialysis?: No    Financial Information    Need for financial assistance?: No    Social Determinants of Health  Social Determinants of Health were addressed in provider documentation.  Please refer to patient history.    Discharge Needs Assessment  Concerns to be Addressed: care coordination/care conferences, adjustment to diagnosis/illness    Clinical Risk Factors: > 65, Lives Alone or Absence of Caregiver to Assist with Discharge and Home Care, New Diagnosis, Functional Limitations    Barriers to taking medications: No    Prior overnight hospital stay or ED visit in last 90 days: Yes    Readmission Within the Last 30 Days: no previous admission in last 30 days         Anticipated Changes Related to Illness: inability to care for self, inability to care for someone else    Equipment Needed After Discharge: walker, rolling    Discharge Facility/Level of Care Needs: nursing facility, skilled    Readmission  Risk of Unplanned Readmission Score: UNPLANNED READMISSION SCORE: 22%  Predictive Model Details           22% (Meduim) Factors Contributing to Score   Calculated 07/30/2018 21:19 19% Number of active Rx orders is 30   [facility] Risk of Unplanned Readmission Model 15% Number of ED visits in last six months is 3     9% ECG/EKG order is present in last 6 months     8% Latest calcium is low (7.8 mg/dL)     7% Latest BUN is high (38 mg/dL)     7% Diagnosis of electrolyte disorder is present     6% Imaging order is present in last 6 months     5% Phosphorous result is present     Readmitted Within the Last 30 Days? (No if blank)   Patient at risk for readmission?: Yes    Discharge Plan  Screen findings are: Discharge planning needs identified or anticipated (Comment).    Expected Discharge Date:      Expected Transfer from Critical Care:      Patient and/or family were provided with choice of facilities / services that are available and appropriate to meet post hospital care needs?: Yes   List choices in order highest to lowest preferred, if applicable. : Pt will likely be needing SNF if pt will agree. If HH, wants to [facility] with [facility]    Initial Assessment complete?: Yes

## 2018-07-31 NOTE — Unmapped (Signed)
Daily Progress Note    Assessment/Plan:    Principal Problem:    Constipation  Active Problems:    Acquired hypothyroidism    Benign essential hypertension    Chronic systolic heart failure (CMS-HCC)    Raynaud's disease    Rheumatoid arthritis involving multiple joints (CMS-HCC)    Dehydration  Resolved Problems:    * No resolved hospital problems. *   Malnutrition Evaluation as performed by RD, LDN: Severe Protein-Calorie Malnutrition in the context of chronic illness (07/30/18 1205)             Marilyn Fry is a 67yoF who presented to [facility] with constipation, weight loss, FTT.  ??  FTT w/ concern for depressive illness vs neurocognitive d/o. 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  - Psychiatry consult appreciated: start mirtazapine 7.5 and titrate (pending QTc eval)  - Nutrition consult appreciated  - regular diet, supplements  - f/u calorie counts, f/u B12/fol/vit D levels  - MVI/folate  - monitor electrolytes closely, risk of refeeding syndrome  - daily weights (although loss from bowel cleanout will likely match increase from volume repletion)    Constipation/FOS: Large stool burden seen throughout colon and rectum, no evidence of obstruction. Patient denies taking any stool softeners, fiber or laxatives. Possibly worsened over the past few weeks by taking narcotics for her back pain.   - bowel regimen PO/PR, effective thus far w/ numerous BMs  - avoiding opioids  ??  AKI, improving but still significant:  Likely related to volume depletion. Improving on repeat labs after IVF. Creat 2.04--> 1.43--> 1.01. Given minimal/no muscle mass, this still represents AKI w/ BUN/Cr 39/1.01  - Holding ACEi, other nephrotoxic meds until improved  ??  HTN hx, currently hypotensive:   - Holding ACEi  - continue low dose BB, reduce dose to 3.125mg  bid  ??  Leukocytosis: Afebrile. No localizing symptoms. UA, CXR negative.  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  - Monitor for fluid overload, but at this point she is volume down  - Continue low dose BB, hold ACEi until creatinine normalized  ??  Back pain:   - Lidoderm patch  - Tylenol 1g TID  - Avoiding opioids as able given severe constipation  ??  Hypothyroidism: Synthroid daily. TSH normal.   ??  FEN/GI/PPX  mIVF  Advance diet as tolerated  enox 30  ___________________________________________________________________    Subjective:  Sister Marilyn Fry provides additional history:  Longstanding eating disorder, described as restrictive.  Had husband debilitated w/ CVA, would underfeed him also. He died by suicide 4 yrs ago.  Since then Marilyn Fry has declined. Seems depressed to family (including pt's niece who is a Warden/ranger), but denies this and won't seek help.   Marilyn Fry's house dilapidated, described as hoarding situation.  Marilyn Fry stays in her own house all day, spends nights w/ her elderly mother w/ dementia.  Family has daytime caregivers (for Mom w/ dementia) help Marilyn Fry w/ her meds as well, when their day/night shifts overlap with Marilyn Fry at [employer].    Pt reports being overwhelmed and held down by all the things she has to do at home to get things in order  Pt agrees to try to eat/drink   Discussed w/ Psychiatry consult    Recent Results (from the past 24 hour(s))   Urinalysis with Culture Reflex    Collection Time: 07/30/18  3:30 PM  Result Value Ref Range    Color, UA Yellow     Clarity, UA Turbid     Specific Gravity, UA 1.025 1.005 - 1.040    pH, UA 5.0 5.0 - 9.0    Leukocyte Esterase, UA Negative Negative    Nitrite, UA Positive (A) Negative    Protein, UA Trace (A) Negative    Glucose, UA Negative Negative    Ketones, UA Trace (A) Negative    Urobilinogen, UA 1.0 mg/dL 0.2 - 2.0 mg/dL    Bilirubin, UA Moderate (A) Negative    Blood, UA Negative Negative    RBC, UA 4 (H) <4 /HPF    WBC, UA 5 0 - 5 /HPF    Squam Epithel, UA 5 0 - 5 /HPF    Bacteria, UA Rare (A) None Seen /HPF    Granular Casts, UA 3 (H) 0 /LPF    Amorphous Crystal, UA Moderate /HPF   Comprehensive metabolic panel    Collection Time: 07/31/18  6:39 AM   Result Value Ref Range    Sodium 139 135 - 145 mmol/L    Potassium 3.3 (L) 3.5 - 5.0 mmol/L    Chloride 111 (H) 98 - 107 mmol/L    CO2 20.0 (L) 22.0 - 30.0 mmol/L    BUN 39 (H) 7 - 21 mg/dL    Creatinine 1.61 (H) 0.60 - 1.00 mg/dL    BUN/Creatinine Ratio 39     EGFR CKD-EPI Non-African American, Female 58 (L) >=60 mL/min/1.66m2    EGFR CKD-EPI African American, Female 36 >=60 mL/min/1.31m2    Glucose 96 65 - 179 mg/dL    Calcium 7.8 (L) 8.5 - 10.2 mg/dL    Albumin 2.2 (L) 3.5 - 5.0 g/dL    Total Protein 4.6 (L) 6.5 - 8.3 g/dL    Total Bilirubin 0.6 0.0 - 1.2 mg/dL    AST 22 14 - 38 U/L    ALT 8 <35 U/L    Alkaline Phosphatase 41 38 - 126 U/L    Anion Gap 8 7 - 15 mmol/L   CBC    Collection Time: 07/31/18  6:39 AM   Result Value Ref Range    WBC 12.9 (H) 4.5 - 11.0 10*9/L    RBC 3.85 (L) 4.00 - 5.20 10*12/L    HGB 12.1 (L) 13.5 - 16.0 g/dL    HCT 09.6 (L) 04.5 - 46.0 %    MCV 93.3 80.0 - 100.0 fL    MCH 31.6 26.0 - 34.0 pg    MCHC 33.8 31.0 - 37.0 g/dL    RDW 40.9 (H) 81.1 - 15.0 %    MPV 7.6 7.0 - 10.0 fL    Platelet 277 150 - 440 10*9/L   Magnesium Level    Collection Time: 07/31/18  6:39 AM   Result Value Ref Range    Magnesium 1.9 1.6 - 2.2 mg/dL   Phosphorus Level    Collection Time: 07/31/18  6:39 AM   Result Value Ref Range    Phosphorus 3.5 2.9 - 4.7 mg/dL     Labs/Studies:  Labs and Studies from the last 24hrs per EMR and Reviewed    Objective:  Temp:  [35.8 ??C (96.4 ??F)-36.8 ??C (98.2 ??F)] 36.8 ??C (98.2 ??F)  Heart Rate:  [93-98] 98  Resp:  [16-17] 16  BP: (100-116)/(59-62) 100/59  SpO2:  [99 %-100 %] 100 %  Gen: cachectic, sitting up in bed, more interactive today  Skin: dry, peeling  ENT: dry MM  CV: RRR  Pulm: CTAB, normal WOB  Abd: hypoactive bowel sounds, soft, nontend  Ext: no edema

## 2018-07-31 NOTE — Unmapped (Signed)
OCCUPATIONAL THERAPY  Evaluation (07/30/18 1604)    Patient Name:  Marilyn Fry       Medical Record Number: [id number]   Date of Birth: November 29, 1950  Sex: Female          OT Treatment Diagnosis:  ADL and functional mobility deficits in setting of failure to thrive     Assessment  Duaine Dredge is a 67 y/o F with PMH significant for chronic systolic heart failure, Raynaud's disease, and rheumatoid arthritis, who presented to [facility] with constipation, weight loss, and FTT. Pt presents to acute OT today with lethargy, decreased functional endurance, and back pain, limiting her performance of ADLs and functional mobility. Based on AM-PAC daily activity score of 18/24, Pt is considered to be 46.65% impaired with self care. Session today focused on education on spinal precautions for comfort in bed mobility and ADLs as well as energy conservation using RW for functional mobility at this time. Pt was relatively lethargic throughout and had poor endurance for thorough ADL assessment. Pt's sister was present t/o session and reports that Pt's rheumatologist has requested full body MRI to assess compression fracture (as noted on 11/22 CT); OT explained purpose of spinal precautions for comfort and alerted MD on call for Hospitalist C team. Anticipate progress to 3x/week post acute to maximize functional independence and safety.     LOW complexity evaluation based on Pt's current deficits, PMH, and clinical judgment.        Today's Interventions: Education - Patient;Education - Family / caregiver;Safety education;Functional mobility;Transfer training;Bed mobility;Compensatory tech. training;ADL retraining    Activity Tolerance During Today's Session  Patient limited by fatigue    Plan  Planned Frequency of Treatment:  1-2x per day for: 3-4x week       Planned Interventions:  Education - Patient;Safety education;Education - Family / caregiver;Functional mobility;Transfer training;Compensatory tech. training;ADL retraining;Endurance activities;Home exercise program    Post-Discharge Occupational Therapy Recommendations:  OT Post Acute Discharge Recommendations: 3x weekly    ;    OT DME Recommendations: (TBD - maybe RW/rollator)    GOALS:        Long Term Goal #1: Pt will be Mod I with all ADLs in 2 weeks       Short Term:  Pt will participate in LB dressing assessment using AE prn   Time Frame : 5 days  Pt will perform toilet t/f and toileting with Mod I using LRAD   Time Frame : 1 week  Pt will participate in bathing assessment   Time Frame : 5 days                  Prognosis:  Good  Positive Indicators:  high PLOF  Barriers to Discharge: Pain;Endurance deficits    Subjective  Current Status    Prior Functional Status Pt reports she is generally independent with ADLs and functional mobility without device. In the past few weeks, her community mobility has been limited d/t back pain.    Medical Tests / Procedures: chart review in Epic: 11/22 CT spine, Age-indeterminate, mild compression deformity of the superior endplate of T4, likely nonacute     Patient / Caregiver reports: It helps me feel steadier (referring to RW)    Past Medical History:   Diagnosis Date   ??? Arthritis     likely osteoarthritis    ??? Back pain    ??? Bladder spasm    ??? Disease of thyroid gland    ??? 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  Painful urination    ??? Undifferentiated connective tissue disease (CMS-HCC)    ??? Vaginal discharge    ??? 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 Smoking status: Never Smoker   ??? Smokeless tobacco: Never Used   Substance Use Topics   ??? Alcohol use: No      Past Surgical History:   Procedure Laterality Date   ??? Laparoscopic Bilateral oophrectomy for torsion     ??? SKIN BIOPSY     ??? TOTAL ABDOMINAL HYSTERECTOMY      Menorrhagia    Family History   Problem Relation Age of Onset   ??? Heart failure Mother    ??? Kidney cancer Sister    ??? Heart failure Maternal Grandmother    ??? Dementia Maternal Grandmother    ??? Heart failure Paternal Grandmother    ??? Colon cancer Maternal Aunt    ??? Arthritis Neg Hx    ??? Melanoma Neg Hx    ??? Basal cell carcinoma Neg Hx    ??? Squamous cell carcinoma Neg Hx         Carvedilol; Levofloxacin; Prednisone; Valtrex [valacyclovir]; Nitrofurantoin; Sulfacetamide sodium; Azithromycin; Doxycycline; Erythromycin; Guaifenesin; Nitrofurantoin macrocrystalline; Sulfa (sulfonamide antibiotics); and Tetracycline     Objective Findings  Precautions / Restrictions  Falls precautions;Isolation precautions    Weight Bearing  Non-applicable(spinal precautions for comfort)    Required Braces or Orthoses  Non-applicable    Communication Preference  Verbal(softspoken)    Pain  No c/o    Equipment / Environment  Vascular access (PIV, TLC, Port-a-cath, PICC);Rolling walker    Living Situation   Living environment: House   Lives With: Alone(though family close by?)   Home Living: Walk-in shower;Shower chair with back;Two level home;Able to Live on main level with bedroom/bathroom;Stairs to enter with rails   Equipment available at home: Shower chair            Cognition   Orientation Level:  Oriented x 4   Arousal/Alertness:  Appropriate responses to stimuli(initially lethargic, but responding appropriately once mobilized sitting EOB)   Attention Span:  Appears intact(though conflated by drowiness)   Memory:  Unable to assess(WFL for describing PLOF and recent events - drowsiness limited more formal evaluation)   Following Commands:  Follows one step commands consistently   Safety Judgment:  Good awareness of safety precautions   Awareness of Errors:  Good awareness of safety precautions   Problem Solving:  Able to problem solve independently   Comments:      Vision / Perception     Perception: appears WFL for functional mobility - though eyes not fully open initially       Hand Function  Hand Dominance: right  WFL    Skin Inspection  Visible skin grossly intact    ROM / Strength/Coordination  UE ROM/ Strength/ Coordination: WFL for bed mobility/transfer - MMT not performed d/t back pain and Pt's drowsiness  LE ROM/ Strength/ Coordination: WFL for functional mobility to/from bathroom using RW    Sensation:  No c/o parasthesias or numbness    Balance:  static/dynamic sitting: Supervision, static/dynamic standing: SBA using RW (CGA with hands off RW, slightly off balance when first standing from bed)    Mobility/Gait/Transfers: bed mobility    ADL:     Grooming: Pt declines performance, stating she performed standing at the sink earlier  Dressing: OT educated Pt on figure four position for LB dressing to protect back  and avoid bending over     Toileting: Supervision for t/f to toilet using RW       Vitals/ Orthostatics:  At Rest: NAD  With Activity: NAD       Interventions Performed During Today's Session: OT evaluation, education on spinal precautions (for comfort), compensatory technique for LB dressing (figure four position), functional mobility using RW (d/t fatigue), log roll technique for bed mobility         Eval Duration (OT): 26 Min.    Medical Staff Made Aware: RN Toni Amend    I attest that I have reviewed the above information.  Signed: Alphonse Guild, OT  Filed 07/30/2018

## 2018-07-31 NOTE — Unmapped (Signed)
[facility]: [facility]  Initial Psychiatry Consult Note     Service Date: July 31, 2018  LOS:  LOS: 1 day      Assessment:   Marilyn Fry is a 67 y.o. female with a pertinent past medical and psychiatric diagnoses of RA, OA, recent NSTEMI, and hypothyroidism admitted 07/29/2018 11:42 PM for FTT.  Patient was seen in consultation by Psychiatry at the request of Lillia Carmel, MD with [facility] ([facility]) for evaluation of Depression and disordered eating.    The patient's current presentation of FTT with reduced appetite and constipation is most consistent with complications of chronic medical issues but there remains concern for an underlying Depressive illness vs. Major Neurocognitive Disorder.     She completed a MOCA on 07/31/18 and scored 18/30, which may indicated a neurocognitive disorder vs. pseudodementia 2/2 depression vs. poor performance 2/2 medical illness. Per pt's sister the family has noticed a slow cognitive decline more recently with primary deficits in memory. There is no recent head imaging and it may be useful to see if there are more pronounced changes in brain structure consistent with dementing processes or if there are any other subacute changes that could lead to her presentation given her autoimmune illnesses. She could also have a reversible cause of dementia given her poor PO intake, so vitamin levels such as a B12 and folate should be checked.    Pt endorsed having a flare of her rheumatoid illness and other inflammatory conditions recently, and flares have historically led to decreased appetite. She was also expereincing GI upset and constipation further reducing her appetite. Per collateral from family, she has had more restrictive eating for the past 8 years, but this wouldn't necessarily account for more acute changes recently with her weight. They feel that her restrictive eating is more of an ineffective coping skill that is sometimes seen in those with eating disorders who use food restriction as a way to reduce anxiety and assert control. This is not consistent with the pt's report of her eating pattern's, but may be one element to her current presentation. The pt did not endorse concerns about body dysmorphia, image, or weight typically seen in those with anorexia nervosa or bulimia. She may meet criteria for Avoidant Restrictive Food Intake Disorder (ARFID), though this is a diagnosis of exclusion from other medical illnesses.      She denied depressed mood or SI at present or in the past other than for brief periods of time. She did have some depressive symptoms with decreased appetite, poor sleep, worsening cognition, and decreased energy. She attributed her current situation to feeling fatigued and unable to keep up with demands around the house. There is a question of cause and effect in her presentation as poor sleep and decrease PO intake would cause her to feel fatigued and unable to keep up with daily demands, but it is unclear if her appetite and sleep changes are secondary to chronic medical issues, mood issues, or cognitive changes.     Despite her unclear diagnostic picture, she may benefit from treatment with Remeron to target appetite, sleep, mood, and anxiety. While QTc prolongation is a concern, Remeron is one of the least likely antidepressant medications to prolong QTc and there is concern for continued FTT, which will also put the pt at risk, without intervention. She was not interested in taking more medications, but could likely benefit from this medication at this time. There is not sufficient evidence to force  treatment at this time and pt is not amenable to psychiatric medications/hospitalzation. Would offer Remeron to pt despite her desire to reduce medication burden and will continue to meet with her regarding psychiatric care.     Please see below for detailed recommendations.    Diagnoses:   Active Hospital problems:  Principal Problem:    Constipation  Active Problems:    Acquired hypothyroidism    Benign essential hypertension    Chronic systolic heart failure (CMS-HCC)    Raynaud's disease    Rheumatoid arthritis involving multiple joints (CMS-HCC)    Dehydration     Problems edited/added by me:  No problems updated.    Safety Risk Assessment:  A suicide and violence risk assessment was performed as part of this evaluation. Risk factors for self-harm/suicide: sense of isolation, unwillingness to seek help, family history of suicide, widowed, recent bereavement, chronic mental illness > 5 years and rigid thinking.  Protective factors against self-harm/suicide:  lack of active SI, no know access to weapons or firearms, no history of previous suicide attempts , has access to clinical interventions and support, supportive family, sense of responsibility to family and social supports, presence of an available support system, expresses purpose for living, current treatment compliance and support system in agreement with treatment recommendations.  Risk factors for harm to others: high emotional distress and recent loss.  Protective factors against harm to others: no known history of violence towards others, no known history of threats of harm towards others, no commands hallucinations to harm others in the last 6 months, no active symptoms of psychosis, no active symptoms of mania, intolerant attititude toward deviance, positive social orientation and connectedness to family.  While future psychiatric events cannot be accurately predicted, the patient is not currently at elevated imminent risk, and is at elevated chronic risk of harm to self and is not currently at elevated imminent risk, and is at elevated chronic risk of harm to others.    Recommendations:   ## Safety:   -- No acute safey concerns.  Please see safety assessment for further discussion.    ## Medications:   -- Start Remeron 7.5 mg qhs for sleep, appetite, and mood. Increase to 15 mg in 3 days if pt tolerates well.  -- START Thiamine IV 200 mg tid x5 days, then transition to PO/NGT 100 mg daily.  IV formulation chosen given poor PO intake and age.  -- START Folate supplementation and MTV daily.         ## Medical Decision Making Capacity:   -- A formal capacity assessement was not performed as a part of this evaluation.  If specific capacity questions arise, please contact our team as below.     ## Further Work-up:   -- Continue to work-up and treat possible medical conditions that may be contributing to current presentation.   -- Recommend labs/studies including: Vit B12, Folate and Vit D   -- MRI w/wo contrast    -- Would monitor % of meals eaten    ## Disposition:   -- There are no psychiatric contraindications to discharging this patient when medically appropriate.    ## Behavioral / Environmental:   -- Please order Delirium (prevention) protocol: the following can be copied into a single misc nursing order.        - RN to open blinds every morning        - To bedside: glasses, hearing aide, patient's own shoes. Make available to patient's when possible and encourage  use        - RN to assess orientation (person, place, & time) qam and prn, with frequent reorientation (verbal & whiteboard) & introduction of caregivers. ??         - Recommend extended visiting hours with familiar family/friends as feasible        - Encourage normal sleep-wake cycle by promoting a dark, quiet environment at night and stimulating, light environment during the day. ??        - Turn the TV off when patient is asleep or not in use.    Thank you for this consult request. Recommendations have been communicated to the primary team.  We will follow as needed at this time. Please page [phone] (M-F) for any questions or concerns.     Discussed with and seen by Attending, Lucienne Capers, MD, who agrees with the assessment and plan.    Otilio Carpen, MD    I saw and evaluated the patient, participating in the key portions of the service.  I reviewed the resident???s note.  I agree with the resident???s findings and plan.     Rose Phi, MD        History:   Relevant Aspects of Hospital Course:   Admitted on 07/29/2018 for FTT. Pt found to have large stool burden seen throughout colon and rectum, no evidence of obstruction. Bowel regimen PO/PR, effective thus far w/ 2 BMs. She was found to have and AKI, with Cr improved from 2.04 to 1.43 with IVF. Her home ACEi has been held. She had a leukocytosis on admission, which down trended with abx.  CXR negative, UA somewhat c/w infection, and BCx negative thus far. She has been continued on her home Synthroid daily and TSH was normal.   ??  Patient Report:   Patient felt that she started doing more poorly over the past few months. She has noticed weight issues lately and attributed this to caring for her mother. She goes to visit her mother who has Alzheimer's disease every evening and spends the night there. She feels it is hard to care for her mother and take care of the things she needs to do for herself. She notes that earlier in the year, her neighbor got rid of several cats and at this point she is now caring for 14 cats, only one of whom lives in her home. She said that she doesn't cook often because she isn't home as much and this has also contributed to her poor PO intake. She doesn't feel hungry often. She endorsed having gastric reflux when eating for the past couple of months and tried to take in smaller portions more often, which reduced this sensation. She denied thinking that she looked overweight or having much concern about her weight. She notes her usual weight is 115lbs, but that she lost weight recently without clear reason and was near 100lbs. She tried to eat more and feels her weight has been yo-yo'ing more recently. When she noticed how low her weight was on this admission, she said she was concerned and wants to address this issue. She noted having a number of inflammatory conditions like RA, OA, Sjogren's, and Raynaud's which she thinks have been flaring more recently. She has had negative reactions to a number of medications for these conditions and said that she doesn't take much each day other than her aspirin. She notes that when her conditions are flaring she tends to have less of an appetite  and will try to find foods that help to Fry her inflammation.    ROS:   Pertinent positives and negative are as follows, all other systems reviewed negative: no current GI upset. Constipation resolving. Mild pain.    Collateral information:   Reviewed the following sources, including relevant findings.  - [place] Controlled Substance Reporting System: pt with recent fills for tramadol  - Review of medical records in [place]    - Spoke with pt's sister Riki Sheer: [phone] on 07/31/18 @ 2:30 PM for ~15 min.  She feels that the pt has had constant losses for the past several years. The pt's husband had a major stroke and it was a big fight for him to live and make it home.  He died of suicide ~ 4 years ago. The pt has also lost a few friends more recently. The pt won't acknowledge that these things bother her and doesn't want to admit that she can't handle these things. She just tries to be strong and hasn't been able to keep up with things the way she used to. In the last 8 years the pt has had more issues with restrictive eating. She feels that this has escalated since the pt's husband died. She was concerned that the pt wasn't feeding her husband enough food. It seemed more about control than it was about care for him. She encourages her sister to eat more, but this has not been effective. The whole family has noticed that the pt has been struggling more with her memory in recent years. The pt has always been a person who is very bright, but tends to be in a lot of chaos. She carries a car full of bags and she is always loosing things. She has had many many health problems for the last 15-20 years. She told the family 18 years ago that she had lupus, but it is unclear if this is true. 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 The pt tends to stay up until 3-4AM every day and does not sleep during the day. The pt has slept at her mother's house since her husband died. She feels that the pt likes to stay with her mother for comfort and because she feels that she is contributing to the family. They have tried to hire more help for their mother, but the pt feels like she is being edged out. The pt's brothers and sisters have been frustrated with the pt at times because they are concerned about her health and she does not listen to them.     Psychiatric History:   Prior psychiatric diagnoses: denies  Psychiatric hospitalizations: denies  Substance abuse treatment: denies  Suicide attempts / Non-suicidal self-injury: denies  Medication trials/compliance: no prior psych medication trials  Current OP psychiatric medication regimen: none  Current psychiatrist: none  Current therapist: none  Denies family history of depressive disorders, anxiety disorders, bipolar and related disorders and schizophrenia spectrum or other psychotic disorders    Medical History:  Past Medical History:   Diagnosis Date   ??? Arthritis     likely osteoarthritis    ??? Back pain    ??? Bladder spasm    ??? Disease of thyroid gland    ??? 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 Painful urination    ??? Undifferentiated connective tissue disease (CMS-HCC)    ??? Vaginal discharge    ??? 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 Laparoscopic Bilateral oophrectomy for torsion     ??? SKIN BIOPSY     ??? TOTAL ABDOMINAL HYSTERECTOMY      Menorrhagia       Medications:     Current Facility-Administered Medications:   ???  acetaminophen (TYLENOL) tablet 1,000 mg, 1,000 mg, Oral, Q8H, Peter Garter, MD, 1,000 mg at 07/31/18 1610  ???  bisacodyl (DULCOLAX) suppository 10 mg, 10 mg, Rectal, Daily PRN, Lillia Carmel, MD  ???  carvedilol (COREG) tablet 3.125 mg, 3.125 mg, Oral, BID, Peter Garter, MD, 3.125 mg at 07/31/18 9604  ???  enoxaparin (LOVENOX) syringe 30 mg, 30 mg, Subcutaneous, Q24H SCH, Beth Judy Pimple, MD  ???  influenza vaccine quad (FLUARIX, FLULAVAL, FLUZONE) (6 MOS & UP) 2019-20, 0.5 mL, Intramuscular, During hospitalization, Lillia Carmel, MD  ???  lactated Ringers infusion, 100 mL/hr, Intravenous, Continuous, Lillia Carmel, MD, Last Rate: 100 mL/hr at 07/30/18 1751, 100 mL/hr at 07/30/18 1751  ???  levothyroxine (SYNTHROID) tablet 50 mcg, 50 mcg, Oral, daily, Peter Garter, MD, 50 mcg at 07/31/18 5409  ???  lidocaine (LIDODERM) 5 % patch 1 patch, 1 patch, Transdermal, Q24H, Peter Garter, MD, 1 patch at 07/31/18 [phone]  ???  pneumococcal polysacchride (23-valps) (PNEUMOVAX) vaccine 0.5 mL, 0.5 mL, Subcutaneous, During hospitalization, Lillia Carmel, MD  ???  polyethylene glycol (MIRALAX) packet 17 g, 17 g, Oral, TID, Lillia Carmel, MD, 17 g at 07/31/18 1478    Allergies:  Allergies   Allergen Reactions   ??? Carvedilol Rash and Anaphylaxis     Dose problem with higher doses   ??? Levofloxacin Nausea And Vomiting   ??? Prednisone Other (See Comments)     muscle weakness/pain  Oral causes SEVERE hip and leg pain to the point she cannot walk.  However, she can take the prednisone shot without any adverse reactions.   ??? Valtrex [Valacyclovir] Other (See Comments)     Burning and Peeling of skin   ??? Nitrofurantoin    ??? Sulfacetamide Sodium    ??? Azithromycin Rash     Severe rash with peeling  Severe rash with peeling   ??? Doxycycline Rash   ??? Erythromycin Rash   ??? Guaifenesin      Felt sick and worsening of symptoms  Felt sick and worsening of symptoms   ??? Nitrofurantoin Macrocrystalline Rash   ??? Sulfa (Sulfonamide Antibiotics) Rash   ??? Tetracycline Rash       Social History:   Living situation: the patient lives alone. She has many cats that she cares for  Guardian/Payee: None  Relationship Status: widowed   Children: None  Education: master's degree  Income/Employment/Disability: Retired   Financial planner: No    Tobacco use: denies historical use   Alcohol use: Denies current use  Drug use: Denies use of illicit substances    Objective:   Vital signs:   Temp:  [35.8 ??C-36.8 ??C] 36.8 ??C  Heart Rate:  [93-98] 98  Resp:  [16-17] 16  BP: (100-116)/(59-62) 100/59  MAP (mmHg):  [75-84] 75  SpO2:  [99 %-100 %] 100 %    Physical Exam:  Gen: No acute distress.  Pulm: Normal work of breathing.  Neuro/MSK: Pt slim with decreased muscle bulk. Gait deferred given frialty.  Skin: normal skin tone.    Mental Status Exam:  Appearance:  appears older than stated age, well-developed, malnourished and lying  in bed   Attitude:   calm, cooperative and polite   Behavior/Psychomotor:  appropriate eye contact and no abnormal movements   Speech/Language:   normal rate, volume, tone, fluency and language intact, well formed   Mood:  ???fine???   Affect:  calm, cooperative, euthymic and mood congruent    Thought process:  logical, linear, clear, coherent, goal directed   Thought content:    denies thoughts of self-harm. Denies SI, plans, or intent. Denies HI.  No grandiose, self-referential, persecutory, or paranoid delusions noted.   Perceptual disturbances:   denies auditory and visual hallucinations and behavior not concerning for response to internal stimuli   Attention:  able to fully attend without fluctuations in consciousness   Concentration:  Able to fully concentrate and attend   Orientation:  Oriented to person, place, city and year. and Disoriented to  date, day and month.   Memory:  On delayed recall, 0/5   Fund of knowledge:   not formally assessed   Insight:    Fair   Judgment:   Fair   Impulse Control:  Fair       Data Reviewed:  I reviewed labs from the last 24 hours.  I reviewed imaging reports from the last 24 hours.  I obtained history from the collateral sources noted above in the history.    Additional Psychometric Testing:  MOCA (performed 07/31/18 by Waynard Edwards, MD): 18/30.  Missed points for cube copy, hands on clock draw, (1/3 pts) serial 7's, (0/5) delayed recall, month, date, and day.

## 2018-08-01 DIAGNOSIS — R109 Unspecified abdominal pain: Principal | ICD-10-CM

## 2018-08-01 LAB — BASIC METABOLIC PANEL
ANION GAP: 5 mmol/L — ABNORMAL LOW (ref 7–15)
BLOOD UREA NITROGEN: 27 mg/dL — ABNORMAL HIGH (ref 7–21)
BUN / CREAT RATIO: 48
CALCIUM: 8.2 mg/dL — ABNORMAL LOW (ref 8.5–10.2)
CHLORIDE: 113 mmol/L — ABNORMAL HIGH (ref 98–107)
CO2: 21 mmol/L — ABNORMAL LOW (ref 22.0–30.0)
CREATININE: 0.56 mg/dL — ABNORMAL LOW (ref 0.60–1.00)
EGFR CKD-EPI AA FEMALE: >90 mL/min/{1.73_m2} (ref >=60)
EGFR CKD-EPI NON-AA FEMALE: >90 mL/min/{1.73_m2} (ref >=60)
GLUCOSE RANDOM: 79 mg/dL (ref 65–179)
POTASSIUM: 3.2 mmol/L — ABNORMAL LOW (ref 3.5–5.0)

## 2018-08-01 LAB — LIPID PANEL
CHOLESTEROL: 82 mg/dL — ABNORMAL LOW (ref 100–199)
LDL CHOLESTEROL CALCULATED: 33 mg/dL
NON-HDL CHOLESTEROL: 53 mg/dL
TRIGLYCERIDES: 101 mg/dL (ref 1–149)
VLDL CHOLESTEROL CAL: 20.2 mg/dL

## 2018-08-01 LAB — SODIUM: Sodium:SCnc:Pt:Ser/Plas:Qn:: 139

## 2018-08-01 LAB — CHOLESTEROL: Cholesterol:MCnc:Pt:Ser/Plas:Qn:: 82 — ABNORMAL LOW

## 2018-08-01 LAB — MAGNESIUM: Magnesium:MCnc:Pt:Ser/Plas:Qn:: 1.8

## 2018-08-01 NOTE — Unmapped (Deleted)
Pt alert and oriented x4. Voiced no pain. Bowel regimen continued, with new order for sorbitol. Pt reports no bm this shift.444

## 2018-08-01 NOTE — Unmapped (Signed)
Social Work  Psychosocial Assessment    SW met with pt on two separate occasions and conferred with Psychiatry, MD. RN & PT. Pt will remain here through the weekend d/t AKI and dehydration. 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 Pt so far expressed interest in HH only. Also expressed willingness to learn more about antidepressant medications but stated she generally doesn't like to take medicines. SW will f/u more on Monday.    Of note, pt did not emote flat affect today and instead relayed stories of her husband and of others she used to care for. Was appropriately tearful at times.    Patient Name: Marilyn Fry   Medical Record Number: [id number]   Date of Birth: [birth year]  Sex: Female     Referral  Referred by: Care Manager  Reason for Referral: Patient Safety Concern Upon Discharge (hoarding, lack of caregiver support, etc.), Readmission Risk(Pt reportedly is CG for her 69 yo mother who has Alzheimers. Pt was reported to have flat affect and be very weak with FTT.)    Extended Emergency Contact Information  Primary Emergency Contact: Riki Sheer  Mobile Phone: [phone]  Relation: Sister  Secondary Emergency Contact: Berline Lopes  Work Phone: [phone]  Relation: Sister    Legal Next of Kin / Guardian / POA / Advance Directives    HCDM (patient stated preference): Riki Sheer - Sister - [phone]    Advance Directive (Medical Treatment)  Does patient have an advance directive covering medical treatment?: Patient does not have advance directive covering medical treatment.  Reason patient does not have an advance directive covering medical treatment:: Patient needs follow-up to complete one.  Information provided on advance directive:: Yes  Patient requests assistance:: No    Advance Directive (Mental Health Treatment)  Does patient have an advance directive covering mental health treatment?: Patient does not have advance directive covering mental health treatment.    Discharge Planning  Discharge Planning Information:   Type of Residence   Mailing Address:  [address] [place] [address]   Phone: [phone] (home)     Medical Information   Past Medical History:   Diagnosis Date   ??? Arthritis     likely osteoarthritis    ??? Back pain    ??? Bladder spasm    ??? Disease of thyroid gland    ??? 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 Painful urination    ??? Undifferentiated connective tissue disease (CMS-HCC)    ??? Vaginal discharge    ??? 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 Laparoscopic Bilateral oophrectomy for torsion     ??? SKIN BIOPSY     ??? TOTAL ABDOMINAL HYSTERECTOMY      Menorrhagia       Family History   Problem Relation Age of Onset   ??? Heart failure Mother    ??? Kidney cancer Sister    ??? Heart failure Maternal Grandmother    ??? Dementia Maternal Grandmother    ??? Heart failure Paternal Grandmother    ??? Colon cancer Maternal Aunt    ??? Arthritis Neg Hx    ??? Melanoma Neg Hx    ??? Basal cell carcinoma Neg Hx    ??? Squamous cell carcinoma Neg Hx        Financial Information   Primary  Insurance: Payor: Multimedia programmer ADV / Plan: Advertising copywriter MEDICARE ADV / Product Type: *No Product type* /    Secondary Insurance: None   Prescription Coverage: Medicare D     Preferred Pharmacy: [facility] [place], [place] - [address]  [facility]    Barriers to taking medication: Yes, Pt states she doesn't like to take medications.    Transition Home   Transportation at time of discharge: Family/Friend's Private Vehicle    Anticipated changes related to Illness: inability to care for self and inability to care for someone else (this is patient's current presentation but she has shown cognitive and functional improvement over the last day so this may improve.)   Services in place prior to admission: N/A   Services anticipated for DC: Home Based Services vs. Facility Based Services/SNF   Hemodialysis Prior to Admission: No    Readmission  Risk of Unplanned Readmission Score: UNPLANNED READMISSION SCORE: 25%  Readmitted Within the Last 30 Days? No  Readmission Factors include: unable to assess    Social Determinants of Health  Social History     Socioeconomic History   ??? Marital status: Widowed     Spouse name: None   ??? Number of children: 0   ??? Years of education: None   ??? Highest education level: Bachelor's degree (e.g., BA, AB, BS)   Occupational History   ??? Occupation: RETIRED   Social Needs   ??? Financial resource strain: None   ??? Food insecurity:     Worry: None     Inability: None   ??? Transportation needs:     Medical: None     Non-medical: None   Tobacco Use   ??? Smoking status: Never Smoker   ??? Smokeless tobacco: Never Used   Substance and Sexual Activity   ??? Alcohol use: No   ??? Drug use: No   ??? Sexual activity: None   Lifestyle   ??? Physical activity:     Days per week: None     Minutes per session: None   ??? Stress: None   Relationships   ??? Social connections:     Talks on phone: More than three times a week     Gets together: More than three times a week     Attends religious service: None     Active member of club or organization: None     Attends meetings of clubs or organizations: None     Relationship status: Widowed   Other Topics Concern   ??? Do you use sunscreen? No   ??? Tanning bed use? No   ??? Are you easily burned? No   ??? Excessive sun exposure? No   ??? Blistering sunburns? No   Social History Narrative    Husband passed away in 2013-07-19.    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    4 siblings, all of whom pitch in and help but at least 2 of the siblings still work and one lives in [place] and comes once/month.     Housing/Utilities   ??? Within the past 12 months, have you ever stayed: outside, in a car, in a tent, in an overnight shelter, or temporarily in someone else's home (i.e. couch-surfing)? No    ??? Are you worried about losing your housing?     ??? Within the past 12 months, have you been unable to get  utilities (heat, electricity) when it was really needed?       Literacy   ??? How often do you need to have someone help you when you read instructions, pamphlets, or other written material from your doctor or pharmacy?         Social History  Support Systems: Family Members, Friends/Neighbors(Pt has 4 siblings and she states they are a close family)    Medical and Psychiatric History  Psychosocial Stressors: Coping with health challenges/recent hospitalization, Grief/Loss (e.g. relationship, social, work, financial)(Pt tearful about death of her husband 5 years ago.)      Psychological Issues/Information: Cognitive impairment suspected(per Psychiatry assessment)      Chemical Dependency: None      Outpatient Providers: Primary Care Provider, Needs new provider referral   Name / Contact #: : PCP: Reubin Milan, MD / [phone]. Pt noted that she feels she may need to change PCPs to [facility] in [place].     Legal: No legal issues      Ability to [employer]: No issues accessing community services, Comment   Comment: Pt seems hesitant to accept some recommended services.    Mercer Pod, MSW, LCSW, C-[facility], ACM-SW, July 31, 2018 4:40 PM

## 2018-08-01 NOTE — Unmapped (Signed)
POC discussed with patient.  Ambulated to bathroom with assistance and walker.  Patient only ate 2-3 bites of her sandwich tonight.  Voided of urine that was very dark brown in color.  IVF infusing at 186ml/hr. Encouraged patient to drink more water.  Patient states that she doesn't have any appetite.  For echo in am.     Problem: Adult Inpatient Plan of Care  Goal: Plan of Care Review  Outcome: Progressing  Goal: Patient-Specific Goal (Individualization)  Outcome: Progressing  Goal: Absence of Hospital-Acquired Illness or Injury  Outcome: Progressing  Goal: Optimal Comfort and Wellbeing  Outcome: Progressing  Goal: Readiness for Transition of Care  Outcome: Progressing  Goal: Rounds/Family Conference  Outcome: Progressing     Problem: Infection  Goal: Infection Symptom Resolution  Outcome: Progressing     Problem: Skin Injury Risk Increased  Goal: Skin Health and Integrity  Outcome: Progressing     Problem: Self-Care Deficit  Goal: Improved Ability to Complete Activities of Daily Living  Outcome: Progressing     Problem: Fall Injury Risk  Goal: Absence of Fall and Fall-Related Injury  Outcome: Progressing     Problem: Heart Failure Comorbidity  Goal: Maintenance of Heart Failure Symptom Control  Outcome: Progressing

## 2018-08-01 NOTE — Unmapped (Signed)
Daily Progress Note    Assessment/Plan:    Principal Problem:    Acute kidney insufficiency  Active Problems:    Acquired hypothyroidism    Benign essential hypertension    Chronic systolic heart failure (CMS-HCC)    Raynaud's disease    Rheumatoid arthritis involving multiple joints (CMS-HCC)    Dehydration    Constipation  Resolved Problems:    * No resolved hospital problems. *   Malnutrition Evaluation as performed by RD, LDN: Severe Protein-Calorie Malnutrition in the context of chronic illness (07/30/18 1205)             Marilyn Fry is a 67yoF who presented to [facility] with FTT/weight loss, constipation/large stool burden.  ??  FTT w/ concern for depressive illness vs neurocognitive d/o. 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  - Psychiatry consult appreciated: start mirtazapine 7.5 and increase to 15 on 12/9; thiamine 200 iv x3d then 100 po daily, fol/mvi  - Nutrition consult appreciated: mighty shakes, monitor intake  - regular diet, supplements  - f/u calorie counts, f/u fol/vit D levels. B12 normal 710  - monitor electrolytes closely, risk of refeeding syndrome (although still minimal intake)  - daily weights (although loss from bowel cleanout will likely match increase from volume repletion)    Constipation/FOS: Large stool burden seen throughout colon and rectum, no evidence of obstruction. Patient denies taking any stool softeners, fiber or laxatives. Possibly worsened over the past few weeks by taking narcotics for her back pain.   - bowel regimen, provide sorbitol today  - continue IVF hydration  - avoid opioids  ??  AKI, improving but still significant:  Likely related to volume depletion. Improving on repeat labs after IVF. Creat 2.04--> 1.43--> 1.01--> 0.56. Given minimal/no muscle mass, this still represents AKI w/ BUN/Cr 27/0.56  - Holding ACEi, other nephrotoxic meds until improved  ??  Chronic/remote lacunar infarcts. Lipid panel impressively low r/t her malnutrition. I do not feel statin is indicated and higher risk of symptoms given her cachexia.  - start ASA 81, d/c statin started last night  - f/u echo     HTN hx, currently hypotensive:   - Holding ACEi  - continue low dose BB, reduce dose to 3.125mg  bid  ??  Leukocytosis: Afebrile. No localizing symptoms. UA, CXR negative.  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  - Monitor for fluid overload, but at this point she is volume down  - Continue low dose BB, hold ACEi until creatinine normalized  ??  Back pain:   - Lidoderm patch  - Tylenol 650 TID  - Avoiding opioids given severe constipation  ??  Hypothyroidism: Synthroid daily. TSH normal.   ??  FEN/GI/PPX  mIVF  Regular diet w/ supplements  enox 30  ___________________________________________________________________    Subjective:  Last night prelim read of brain MRI: small acute CVAs. This was communicated to patient last night. Echo ordered  Final MRI read: no acute findings, although there are chronic small lacunar infarcts and area of encephalomalacia.   Pt and family updated on this change in read from acute-->chronic.    Trying her best to eat, accomplishing very small amounts (3 bites of sandwich)  Says she is allergic to fish, chicken, eggs. Allergic meaning they hurt her stomach.  Describes eliminating more and more foods over the years due to allergies  No BM since yesterday  AM. Urine reported as very dark colored  Family at bedside (sister Marilyn Fry, brother w/ wife) updated including MRI findings      Recent Results (from the past 24 hour(s))   ECG 12 Lead    Collection Time: 07/31/18  4:24 PM   Result Value Ref Range    EKG Systolic BP  mmHg    EKG Diastolic BP  mmHg    EKG Ventricular Rate 79 BPM    EKG Atrial Rate 79 BPM    EKG P-R Interval 178 ms    EKG QRS Duration 92 ms    EKG Q-T Interval 416 ms    EKG QTC Calculation 477 ms    EKG Calculated P Axis 52 degrees EKG Calculated R Axis -4 degrees    EKG Calculated T Axis -162 degrees    QTC Fredericia 456 ms   Basic Metabolic Panel    Collection Time: 08/01/18  6:03 AM   Result Value Ref Range    Sodium 139 135 - 145 mmol/L    Potassium 3.2 (L) 3.5 - 5.0 mmol/L    Chloride 113 (H) 98 - 107 mmol/L    CO2 21.0 (L) 22.0 - 30.0 mmol/L    Anion Gap 5 (L) 7 - 15 mmol/L    BUN 27 (H) 7 - 21 mg/dL    Creatinine 1.61 (L) 0.60 - 1.00 mg/dL    BUN/Creatinine Ratio 48     EGFR CKD-EPI Non-African American, Female >90 >=60 mL/min/1.41m2    EGFR CKD-EPI African American, Female >90 >=60 mL/min/1.48m2    Glucose 79 65 - 179 mg/dL    Calcium 8.2 (L) 8.5 - 10.2 mg/dL   Magnesium Level    Collection Time: 08/01/18  6:03 AM   Result Value Ref Range    Magnesium 1.8 1.6 - 2.2 mg/dL   Lipid Panel    Collection Time: 08/01/18  6:03 AM   Result Value Ref Range    Triglycerides 101 1 - 149 mg/dL    Cholesterol 82 (L) 096 - 199 mg/dL    HDL 29 (L) 40 - 59 mg/dL    LDL Calculated 33 mg/dL    VLDL Cholesterol Cal 20.2 mg/dL    Chol/HDL Ratio 2.8     Non-HDL Cholesterol 53 mg/dL    FASTING Unknown    Echocardiogram W Colorflow Spectral Doppler    Collection Time: 08/01/18  8:20 AM   Result Value Ref Range    LV Diastolic Diameter PLAX 4.2 cm    LV Systolic Diameter PLAX 2.9 cm    IVS Diastolic Thickness 1.0 cm    LVPW Diastolic Thickness 1.0 cm    Aortic Root Diameter 3.1 cm    LA Systolic Diameter LX 2.7 cm    LA Area 4C View 13.2 cm2    LA Area 2C View 16.5 cm2    LA Volume 42.3 cm3    PV Peak Velocity 0.9 m/s    PV peak gradient 3.00 mmHg     Labs/Studies:  Labs and Studies from the last 24hrs per EMR and Reviewed    Objective:  Temp:  [36.1 ??C (97 ??F)-36.4 ??C (97.5 ??F)] 36.1 ??C (97 ??F)  Heart Rate:  [83-102] 101  Resp:  [16] 16  BP: (137-162)/(70-93) 137/82  SpO2:  [93 %-100 %] 97 %  Gen: cachectic, sitting up in bed, more interactive today  Skin: dry, peeling  ENT: dry MM  CV: RRR  Pulm: CTAB, normal WOB  Abd: hypoactive bowel sounds, soft,  nontend  Ext: no edema  Psych: mood/affect better today

## 2018-08-02 LAB — BASIC METABOLIC PANEL
BLOOD UREA NITROGEN: 13 mg/dL (ref 7–21)
BUN / CREAT RATIO: 32
CALCIUM: 8.1 mg/dL — ABNORMAL LOW (ref 8.5–10.2)
CHLORIDE: 111 mmol/L — ABNORMAL HIGH (ref 98–107)
CO2: 22 mmol/L (ref 22.0–30.0)
CREATININE: 0.41 mg/dL — ABNORMAL LOW (ref 0.60–1.00)
EGFR CKD-EPI AA FEMALE: >90 mL/min/{1.73_m2} (ref >=60)
EGFR CKD-EPI NON-AA FEMALE: >90 mL/min/{1.73_m2} (ref >=60)
GLUCOSE RANDOM: 76 mg/dL (ref 65–179)
POTASSIUM: 3.9 mmol/L (ref 3.5–5.0)
SODIUM: 138 mmol/L (ref 135–145)

## 2018-08-02 LAB — MAGNESIUM: Magnesium:MCnc:Pt:Ser/Plas:Qn:: 1.4 — ABNORMAL LOW

## 2018-08-02 LAB — BLOOD UREA NITROGEN: Urea nitrogen:MCnc:Pt:Ser/Plas:Qn:: 13

## 2018-08-02 NOTE — Unmapped (Signed)
POC discussed with patient.  Pain controlled with prn medications. No falls noted during my shift thus far and I will continue to monitor patient.  Low urine output noted and patient encouraged to drink more liquids.  Multiple bowel movements tonight.  Sister at the bedside.  OOB to bathroom with assistance and walker. IVF infusing per orders.     Problem: Adult Inpatient Plan of Care  Goal: Plan of Care Review  Outcome: Progressing  Goal: Patient-Specific Goal (Individualization)  Outcome: Progressing  Goal: Absence of Hospital-Acquired Illness or Injury  Outcome: Progressing  Goal: Optimal Comfort and Wellbeing  Outcome: Progressing  Goal: Readiness for Transition of Care  Outcome: Progressing  Goal: Rounds/Family Conference  Outcome: Progressing     Problem: Infection  Goal: Infection Symptom Resolution  Outcome: Progressing     Problem: Skin Injury Risk Increased  Goal: Skin Health and Integrity  Outcome: Progressing     Problem: Self-Care Deficit  Goal: Improved Ability to Complete Activities of Daily Living  Outcome: Progressing     Problem: Fall Injury Risk  Goal: Absence of Fall and Fall-Related Injury  Outcome: Progressing     Problem: Heart Failure Comorbidity  Goal: Maintenance of Heart Failure Symptom Control  Outcome: Progressing

## 2018-08-02 NOTE — Unmapped (Signed)
Daily Progress Note    Assessment/Plan:    Principal Problem:    Acute kidney insufficiency  Active Problems:    Acquired hypothyroidism    Benign essential hypertension    Chronic systolic heart failure (CMS-HCC)    Raynaud's disease    Rheumatoid arthritis involving multiple joints (CMS-HCC)    Dehydration    Constipation  Resolved Problems:    * No resolved hospital problems. 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  ??  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   - Psychiatry consult appreciated: start mirtazapine 7.5 and increase to 15 on 12/9; thiamine 200 iv x3d then 100 po daily, fol/mvi  - Nutrition consult appreciated: mighty shakes, monitor intake, prior to discharge requested nutrition return to outline structured plan for SNF to encourage adequate caloric intake  - regular diet, supplements  - f/u calorie counts, f/u fol/vit D levels. B12 normal 710  - monitor electrolytes closely, risk of refeeding syndrome (although still minimal intake)  - daily weights (although loss from bowel cleanout will likely match increase from volume repletion)    Constipation - resolved Large stool burden seen throughout colon and rectum, no evidence of obstruction. Patient denies taking any stool softeners, fiber or laxatives. Possibly worsened over the past few weeks by taking narcotics for her back pain.   - continue daily bowel regimen, which we can continue on discharge: miralax BID, senna   - continue IVF hydration  - avoid opioids  ??  AKI - resolved,  pre-renal related to volume depletion. Improving on repeat labs after IVF. Creatinine peak 2.04 (in the setting of very low muscle mass), now BUN 13, Cr 0.41   ??  Chronic/remote lacunar infarcts. Lipid panel impressively low r/t her malnutrition. I do not feel statin is indicated and higher risk of symptoms given her cachexia.  - start ASA 81  - echocardiogram poor study, unable to provide much information due to difficult windows (likely small rib spaces)     HTN - now elevated   - Holding ACEi  - return BB to home does, 6.25 mg po BID  ??    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  Monitor for fluid overload, stop fluids today given intake improved.  Continue low dose BB, hold ACEi until creatinine normalized  ??  Back pain:   - Lidoderm patch  - Tylenol 650 TID  - Avoiding opioids given severe constipation  ??  Hypothyroidism: Synthroid daily. TSH normal.   ??  FEN/GI/PPX  mIVF  Regular diet w/ supplements  enox 30  ___________________________________________________________________    Subjective:  Improved bowel movements, multiple large overnight  This morning eating toast and bacon, improved intake and mood overall   Mild back pain slightly worse last night, although she believes with movement this will improve       Recent Results (from the past 24 hour(s))   Echocardiogram W Colorflow Spectral Doppler    Collection Time: 08/01/18  8:20 AM  Result Value Ref Range    LV Diastolic Diameter PLAX 4.2 cm    LV Systolic Diameter PLAX 2.9 cm    IVS Diastolic Thickness 1.0 cm    LVPW Diastolic Thickness 1.0 cm    Aortic Root Diameter 3.1 cm    LA Systolic Diameter LX 2.7 cm    LA Area 4C View 13.2 cm2    LA Area 2C View 16.5 cm2    LA Volume 42.3 cm3    PV Peak Velocity 0.9 m/s    PV peak gradient 3.00 mmHg   Basic Metabolic Panel    Collection Time: 08/02/18  5:50 AM   Result Value Ref Range    Sodium 138 135 - 145 mmol/L    Potassium 3.9 3.5 - 5.0 mmol/L    Chloride 111 (H) 98 - 107 mmol/L    CO2 22.0 22.0 - 30.0 mmol/L    Anion Gap 5 (L) 7 - 15 mmol/L    BUN 13 7 - 21 mg/dL    Creatinine 1.61 (L) 0.60 - 1.00 mg/dL    BUN/Creatinine Ratio 32     EGFR CKD-EPI Non-African American, Female >90 >=60 mL/min/1.32m2    EGFR CKD-EPI African American, Female >90 >=60 mL/min/1.44m2    Glucose 76 65 - 179 mg/dL    Calcium 8.1 (L) 8.5 - 10.2 mg/dL   Magnesium Level    Collection Time: 08/02/18  5:50 AM   Result Value Ref Range    Magnesium 1.4 (L) 1.6 - 2.2 mg/dL     Labs/Studies:  Labs and Studies from the last 24hrs per EMR and Reviewed    Objective:  Temp:  [36.1 ??C (97 ??F)-36.9 ??C (98.4 ??F)] 36.3 ??C (97.3 ??F)  Heart Rate:  [87-101] 93  Resp:  [14-18] 14  BP: (137-175)/(78-88) 148/88  SpO2:  [95 %-100 %] 95 %  Gen: cachectic, sitting up in chair eating breakfast, interactive and engaged   Skin: dry, peeling  ENT: dry MM  CV: RRR  Pulm: CTAB, normal WOB  Abd:non tender, not distended   Ext: no edema  Psych: mood/affect improved

## 2018-08-02 NOTE — Unmapped (Signed)
Pt alert and oriented x4. Voiced no pain this shift. Bowel regimen continued, with addition of sorbitol. LBM 12/6. Pt appetite remains poor. Sister at bedside encouraging intake. Stand by assist with walker. VSS and pt in NAD.       Problem: Adult Inpatient Plan of Care  Goal: Plan of Care Review  08/01/2018 1625 by Edythe Clarity, RN  Outcome: Progressing  08/01/2018 1536 by Edythe Clarity, RN  Outcome: Progressing  Goal: Patient-Specific Goal (Individualization)  08/01/2018 1625 by Edythe Clarity, RN  Outcome: Progressing  08/01/2018 1536 by Edythe Clarity, RN  Outcome: Progressing  Goal: Absence of Hospital-Acquired Illness or Injury  08/01/2018 1625 by Edythe Clarity, RN  Outcome: Progressing  08/01/2018 1536 by Edythe Clarity, RN  Outcome: Progressing  Goal: Optimal Comfort and Wellbeing  08/01/2018 1625 by Edythe Clarity, RN  Outcome: Progressing  08/01/2018 1536 by Edythe Clarity, RN  Outcome: Progressing  Goal: Readiness for Transition of Care  08/01/2018 1625 by Edythe Clarity, RN  Outcome: Progressing  08/01/2018 1536 by Edythe Clarity, RN  Outcome: Progressing  Goal: Rounds/Family Conference  08/01/2018 1625 by Edythe Clarity, RN  Outcome: Progressing  08/01/2018 1536 by Edythe Clarity, RN  Outcome: Progressing     Problem: Infection  Goal: Infection Symptom Resolution  08/01/2018 1625 by Edythe Clarity, RN  Outcome: Progressing  08/01/2018 1536 by Edythe Clarity, RN  Outcome: Progressing     Problem: Skin Injury Risk Increased  Goal: Skin Health and Integrity  08/01/2018 1625 by Edythe Clarity, RN  Outcome: Progressing  08/01/2018 1536 by Edythe Clarity, RN  Outcome: Progressing     Problem: Self-Care Deficit  Goal: Improved Ability to Complete Activities of Daily Living  08/01/2018 1625 by Edythe Clarity, RN  Outcome: Progressing  08/01/2018 1536 by Edythe Clarity, RN  Outcome: Progressing     Problem: Fall Injury Risk  Goal: Absence of Fall and Fall-Related Injury  08/01/2018 1625 by Edythe Clarity, RN  Outcome: Progressing  08/01/2018 1536 by Edythe Clarity, RN  Outcome: Progressing     Problem: Heart Failure Comorbidity  Goal: Maintenance of Heart Failure Symptom Control  08/01/2018 1625 by Edythe Clarity, RN  Outcome: Progressing  08/01/2018 1536 by Edythe Clarity, RN  Outcome: Progressing

## 2018-08-03 NOTE — Progress Notes (Deleted)
[facility]  [phone] Fax:([phone]  ID: Katherine Baird OB: 06-May-1951  MR#: [id number]  YCX#:[id number]  Patient Care Team: Reubin Milan, MD as PCP - General (Internal Medicine) Lamar Blinks, MD as Consulting Physician (Cardiology) Roena Malady Alvira Philips, MD as Referring Physician (Obstetrics and Gynecology) Fritzi Mandes, MD as Referring Physician (Dermatology) Deeann Saint, MD (Orthopedic Surgery) Georgette Dover, RN as [facility] Care Management  CHIEF COMPLAINT: Iron deficiency anemia.  INTERVAL HISTORY: Patient returns to clinic today for repeat laboratory work, further evaluation, and consideration of additional IV Feraheme.  Continues to have chronic weakness and fatigue that is unchanged.  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 She has no neurologic complaints.  She denies any recent fevers or illnesses.  She has a good appetite and denies weight loss.  She has no chest pain or shortness of breath.  She denies any nausea, vomiting, constipation, or diarrhea.  She has no melena or hematochezia.  She has no urinary complaints.  Patient offers no further specific complaints today.  REVIEW OF SYSTEMS:   Review of Systems  Constitutional: Positive for malaise/fatigue. Negative for fever and weight loss.  Respiratory: Negative.  Negative for cough and shortness of breath.   Cardiovascular: Negative.  Negative for chest pain and leg swelling.  Gastrointestinal: Negative.  Negative for abdominal pain, blood in stool and melena.  Genitourinary: Negative.  Negative for hematuria.  Musculoskeletal: Negative.  Negative for back pain.  Skin: Negative.  Negative for rash.  Neurological: Positive for weakness. Negative for sensory change and focal weakness.  Psychiatric/Behavioral: Negative.  The patient is not nervous/anxious.     As per HPI. Otherwise, a complete review of systems is  negative.  PAST MEDICAL HISTORY: Past Medical History:  Diagnosis Date  . CHF (congestive heart failure) (HCC)   . Hypertension   . Osteoporosis   . Thyroid disease     PAST SURGICAL HISTORY: Past Surgical History:  Procedure Laterality Date  . LAPAROSCOPIC HYSTERECTOMY  2001   total  . LEFT HEART CATH N/A 07/03/2018   Procedure: Left Heart Cath with Coronary Angiography;  Surgeon: Marcina Millard, MD;  Location: [facility] INVASIVE CV LAB;  Service: Cardiovascular;  Laterality: N/A;    FAMILY HISTORY: Family History  Problem Relation Age of Onset  . Arthritis Mother   . Dementia Mother   . Heart disease Father   . Mesothelioma Father   . Kidney cancer Sister   . Heart disease Sister   . Arthritis Sister   . COPD Brother   . Arthritis Brother   . Arthritis Brother   . COPD Sister     ADVANCED DIRECTIVES (Y/N):  N  HEALTH MAINTENANCE: Social History   Tobacco Use  . Smoking status: Never Smoker  . Smokeless tobacco: Never Used  Substance Use Topics  . Alcohol use: No    Alcohol/week: 0.0 standard drinks  . Drug use: No     Colonoscopy:  PAP:  Bone density:  Lipid panel:  Allergies  Allergen Reactions  . Levofloxacin Nausea And Vomiting  . Doxycycline   . Erythromycin   . Gabapentin   . Guaifenesin     "Felt sick" and worsening of symptoms  . Nitrofuran Derivatives   . Prednisone     muscle weakness/pain  . Sulfa Antibiotics   . Tetracycline   . Azithromycin Rash    Severe rash with peeling  . Carvedilol Other (See  Comments)    Dose problem with higher doses    Current Outpatient Medications  Medication Sig Dispense Refill  . acetaminophen (TYLENOL) 500 MG tablet Take 500 mg by mouth every 8 (eight) hours as needed for moderate pain.    . carvedilol (COREG) 6.25 MG tablet Take 6.25 mg by mouth 2 (two) times daily with a meal.     . clobetasol ointment (TEMOVATE) 0.05 % Apply 1 application topically 2 (two) times daily as needed (for  irritation).     . cyclobenzaprine (FLEXERIL) 5 MG tablet Take 5 mg by mouth 2 (two) times daily as needed for muscle spasms.     Marland Kitchen denosumab (PROLIA) 60 MG/ML SOSY injection Inject 60 mg into the skin every 6 (six) months.    . docusate sodium (COLACE) 100 MG capsule Take 100 mg by mouth daily as needed for mild constipation. Taking one to two a day    . folic acid (FOLVITE) 1 MG tablet Take 1 mg by mouth daily.    Marland Kitchen levothyroxine (SYNTHROID, LEVOTHROID) 50 MCG tablet Take 50 mcg by mouth daily before breakfast.     . lisinopril (PRINIVIL,ZESTRIL) 5 MG tablet Take 1 tablet (5 mg total) by mouth daily. 30 tablet 0  . ondansetron (ZOFRAN-ODT) 4 MG disintegrating tablet TAKE 1 TABLET UNDER THE TONGUE EVERY 8 HOURS AS NEEDED NAUSEA AND VOMITING 30 tablet 1  . oxyCODONE (ROXICODONE) 5 MG immediate release tablet Take 1 tablet (5 mg total) by mouth every 8 (eight) hours as needed. 12 tablet 0  . traMADol (ULTRAM) 50 MG tablet Take 1 tablet (50 mg total) by mouth every 6 (six) hours as needed for moderate pain or severe pain. 30 tablet 0   No current facility-administered medications for this visit.     OBJECTIVE: There were no vitals filed for this visit.   There is no height or weight on file to calculate BMI.    ECOG FS:0 - Asymptomatic  General: Well-developed, well-nourished, no acute distress. Eyes: Pink conjunctiva, anicteric sclera. HEENT: Normocephalic, moist mucous membranes. Lungs: Clear to auscultation bilaterally. Heart: Regular rate and rhythm. No rubs, murmurs, or gallops. Abdomen: Soft, nontender, nondistended. No organomegaly noted, normoactive bowel sounds. Musculoskeletal: No edema, cyanosis, or clubbing. Neuro: Alert, answering all questions appropriately. Cranial nerves grossly intact. Skin: No rashes or petechiae noted. Psych: Normal affect.  LAB RESULTS:  Lab Results  Component Value Date   NA 137 07/08/2018   K 4.6 07/08/2018   CL 104 07/08/2018   CO2 25  07/08/2018   GLUCOSE 122 (H) 07/08/2018   BUN 10 07/08/2018   CREATININE 0.47 07/08/2018   CALCIUM 8.5 (L) 07/08/2018   PROT 7.8 07/02/2018   ALBUMIN 4.5 07/02/2018   AST 32 07/02/2018   ALT 19 07/02/2018   ALKPHOS 57 07/02/2018   BILITOT 1.3 (H) 07/02/2018   GFRNONAA >60 07/08/2018   GFRAA >60 07/08/2018    Lab Results  Component Value Date   WBC 6.9 07/08/2018   NEUTROABS 4.5 07/08/2018   HGB 13.6 07/08/2018   HCT 42.7 07/08/2018   MCV 93.6 07/08/2018   PLT 287 07/08/2018   Lab Results  Component Value Date   IRON 76 04/07/2018   TIBC 209 (L) 04/07/2018   IRONPCTSAT 36 (H) 04/07/2018   Lab Results  Component Value Date   FERRITIN 246 04/07/2018     STUDIES: Dg Chest 2 View  Result Date: 07/08/2018 CLINICAL DATA:  Discharge last week for chest pain. Chest pain increased today  with lightheadedness and dizziness. EXAM: CHEST - 2 VIEW COMPARISON:  Chest CT, 07/02/2018.  Chest radiographs, 04/12/2014. FINDINGS: Cardiac silhouette is borderline enlarged. No mediastinal or hilar masses. No evidence of adenopathy. Clear lungs.  No pleural effusion or pneumothorax. Skeletal structures are demineralized. Old proximal right humerus fracture. IMPRESSION: No acute cardiopulmonary disease. Electronically Signed   By: Amie Portland M.D.   On: 07/08/2018 14:06    ASSESSMENT: Iron deficiency anemia  PLAN:    1. Iron deficiency anemia: Patient's hemoglobin has significantly improved to 11.4 and her iron stores are now within normal limits. By report, patient cannot tolerate oral iron supplementation.  No intervention is needed at this time.  Patient does not require additional IV Feraheme.  Return to clinic in 3 months with repeat laboratory work and further evaluation.  2.  Weakness and fatigue: Likely multifactorial.  Unrelated to anemia. 3.  History of skin eruption: Occurred several days after her treatment with Feraheme.  Unclear if related to infusion, but patient may benefit  from Benadryl and Tylenol premedication in the future.  I spent a total of 20 minutes face-to-face with the patient of which greater than 50% of the visit was spent in counseling and coordination of care as detailed above.  Patient expressed understanding and was in agreement with this plan. She also understands that She can call clinic at any time with any questions, concerns, or complaints.    Jeralyn Ruths, MD   08/03/2018 12:24 AM

## 2018-08-04 ENCOUNTER — Ambulatory Visit: Payer: Self-pay | Admitting: *Deleted

## 2018-08-04 ENCOUNTER — Other Ambulatory Visit: Payer: Self-pay | Admitting: *Deleted

## 2018-08-04 MED ORDER — THIAMINE HCL 100 MG/ML IJ SOLN
200.00 | INTRAMUSCULAR | Status: DC
Start: 2018-08-04 — End: 2018-08-04

## 2018-08-04 MED ORDER — PNV PRENATAL PLUS MULTIVITAMIN 27-1 MG PO TABS
1.00 | ORAL_TABLET | ORAL | Status: DC
Start: 2018-08-05 — End: 2018-08-04

## 2018-08-04 MED ORDER — MIRTAZAPINE 15 MG PO TABS
15.00 | ORAL_TABLET | ORAL | Status: DC
Start: 2018-08-04 — End: 2018-08-04

## 2018-08-04 MED ORDER — TRAMADOL HCL 50 MG PO TABS
50.00 | ORAL_TABLET | ORAL | Status: DC
Start: ? — End: 2018-08-04

## 2018-08-04 MED ORDER — CARVEDILOL 6.25 MG PO TABS
6.25 | ORAL_TABLET | ORAL | Status: DC
Start: 2018-08-04 — End: 2018-08-04

## 2018-08-04 MED ORDER — GENERIC EXTERNAL MEDICATION
1.00 | Status: DC
Start: 2018-08-04 — End: 2018-08-04

## 2018-08-04 MED ORDER — LISINOPRIL 5 MG PO TABS
5.00 | ORAL_TABLET | ORAL | Status: DC
Start: 2018-08-05 — End: 2018-08-04

## 2018-08-04 MED ORDER — ACETAMINOPHEN 325 MG PO TABS
650.00 | ORAL_TABLET | ORAL | Status: DC
Start: 2018-08-04 — End: 2018-08-04

## 2018-08-04 MED ORDER — POLYETHYLENE GLYCOL 3350 17 G PO PACK
17.00 | PACK | ORAL | Status: DC
Start: 2018-08-04 — End: 2018-08-04

## 2018-08-04 MED ORDER — ONDANSETRON 4 MG PO TBDP
4.00 | ORAL_TABLET | ORAL | Status: DC
Start: ? — End: 2018-08-04

## 2018-08-04 MED ORDER — DERMACERIN EX CREA
TOPICAL_CREAM | CUTANEOUS | Status: DC
Start: 2018-08-05 — End: 2018-08-04

## 2018-08-04 MED ORDER — ASPIRIN 81 MG PO CHEW
81.00 | CHEWABLE_TABLET | ORAL | Status: DC
Start: 2018-08-05 — End: 2018-08-04

## 2018-08-04 MED ORDER — POLYETHYLENE GLYCOL 3350 17 GRAM ORAL POWDER PACKET
PACK | Freq: Two times a day (BID) | ORAL | 8 refills | 0 days | Status: CP
Start: 2018-08-04 — End: ?

## 2018-08-04 MED ORDER — TRAMADOL 50 MG TABLET
ORAL_TABLET | Freq: Four times a day (QID) | ORAL | 0 refills | 0.00000 days | Status: CP | PRN
Start: 2018-08-04 — End: 2018-08-09

## 2018-08-04 MED ORDER — DOCUSATE SODIUM 100 MG CAPSULE
ORAL_CAPSULE | Freq: Every day | ORAL | 6 refills | 0 days | Status: CP
Start: 2018-08-04 — End: ?

## 2018-08-04 MED ORDER — MIRTAZAPINE 15 MG TABLET
ORAL_TABLET | Freq: Every evening | ORAL | 9 refills | 0.00000 days | Status: CP
Start: 2018-08-04 — End: ?

## 2018-08-04 MED ORDER — ASPIRIN 81 MG CHEWABLE TABLET
ORAL_TABLET | Freq: Every day | ORAL | 0 refills | 0 days
Start: 2018-08-04 — End: 2018-09-03

## 2018-08-04 NOTE — Patient Outreach (Signed)
  [facility]) Care Management  08/04/2018  Miosha Behe March 03, 1951 [id number]   For scheduled follow up call to patient on today, Noted patient with readmission to [facility] on 12/5.  Will follow patient progress and discharge disposition for next follow up.    [name], RN, [facility] Care Management,Care Management Coordinator  [phone]- Mobile [phone]- Toll Free Main Office

## 2018-08-05 ENCOUNTER — Inpatient Hospital Stay: Payer: Medicare Other | Attending: Oncology

## 2018-08-05 ENCOUNTER — Other Ambulatory Visit: Payer: Self-pay | Admitting: Internal Medicine

## 2018-08-05 DIAGNOSIS — N390 Urinary tract infection, site not specified: Secondary | ICD-10-CM | POA: Diagnosis not present

## 2018-08-05 DIAGNOSIS — M810 Age-related osteoporosis without current pathological fracture: Secondary | ICD-10-CM | POA: Diagnosis not present

## 2018-08-05 DIAGNOSIS — I110 Hypertensive heart disease with heart failure: Secondary | ICD-10-CM | POA: Diagnosis not present

## 2018-08-05 DIAGNOSIS — Z9181 History of falling: Secondary | ICD-10-CM | POA: Diagnosis not present

## 2018-08-05 DIAGNOSIS — Z792 Long term (current) use of antibiotics: Secondary | ICD-10-CM | POA: Diagnosis not present

## 2018-08-05 DIAGNOSIS — I214 Non-ST elevation (NSTEMI) myocardial infarction: Secondary | ICD-10-CM | POA: Diagnosis not present

## 2018-08-05 DIAGNOSIS — M0689 Other specified rheumatoid arthritis, multiple sites: Secondary | ICD-10-CM | POA: Diagnosis not present

## 2018-08-05 DIAGNOSIS — I5032 Chronic diastolic (congestive) heart failure: Secondary | ICD-10-CM | POA: Diagnosis not present

## 2018-08-05 DIAGNOSIS — D509 Iron deficiency anemia, unspecified: Secondary | ICD-10-CM | POA: Diagnosis not present

## 2018-08-05 DIAGNOSIS — I480 Paroxysmal atrial fibrillation: Secondary | ICD-10-CM | POA: Diagnosis not present

## 2018-08-05 DIAGNOSIS — E039 Hypothyroidism, unspecified: Secondary | ICD-10-CM | POA: Diagnosis not present

## 2018-08-06 ENCOUNTER — Other Ambulatory Visit: Payer: Self-pay | Admitting: *Deleted

## 2018-08-06 DIAGNOSIS — N390 Urinary tract infection, site not specified: Secondary | ICD-10-CM | POA: Diagnosis not present

## 2018-08-06 DIAGNOSIS — E039 Hypothyroidism, unspecified: Secondary | ICD-10-CM | POA: Diagnosis not present

## 2018-08-06 DIAGNOSIS — I480 Paroxysmal atrial fibrillation: Secondary | ICD-10-CM | POA: Diagnosis not present

## 2018-08-06 DIAGNOSIS — Z9181 History of falling: Secondary | ICD-10-CM | POA: Diagnosis not present

## 2018-08-06 DIAGNOSIS — I110 Hypertensive heart disease with heart failure: Secondary | ICD-10-CM | POA: Diagnosis not present

## 2018-08-06 DIAGNOSIS — Z792 Long term (current) use of antibiotics: Secondary | ICD-10-CM | POA: Diagnosis not present

## 2018-08-06 DIAGNOSIS — I5032 Chronic diastolic (congestive) heart failure: Secondary | ICD-10-CM | POA: Diagnosis not present

## 2018-08-06 DIAGNOSIS — M810 Age-related osteoporosis without current pathological fracture: Secondary | ICD-10-CM | POA: Diagnosis not present

## 2018-08-06 DIAGNOSIS — M0689 Other specified rheumatoid arthritis, multiple sites: Secondary | ICD-10-CM | POA: Diagnosis not present

## 2018-08-06 DIAGNOSIS — I214 Non-ST elevation (NSTEMI) myocardial infarction: Secondary | ICD-10-CM | POA: Diagnosis not present

## 2018-08-06 DIAGNOSIS — D509 Iron deficiency anemia, unspecified: Secondary | ICD-10-CM | POA: Diagnosis not present

## 2018-08-06 NOTE — Patient Outreach (Signed)
[facility]) Care Management  [facility] Care Manager  08/06/2018   Katherine Baird July 30, 1951 [id number]   Transition of care by PCP office  Referral received : 11/8 Referral source : [facility] Care Manager Referral reason : [facility] inpatient admission , 117-11/8 NSTEMI , cardiac cath results : normal coronary anatomy , overall preserved left ventricular function ,Takotsubo cardiomyopathy  Insurance : [facility] Medicare   PMHx noted : significant for Hypertension , Diastolic Heart failure, recent UTI , back pain,iron deficiency , osteoporosis  ED visits 11/3 Dx: Chest pain  ED visit 11/22 at [facility] ED  Dx : Pyelonephritis , back pain  ED visit 11/24 at [facility] ED Dx: Abdominal pain, Constipation. Referral to [facility] spine center regarding T4 compression fracture, await phone call within a week regarding scheduling an appointment .  Admission to [facility] 12/4- 12/10 Dx: Vomiting intractability, abdominal pain, dehydration, constipation, FTT.  Subjective:  Successful outreach call to patient. Patient discussed feeling better than a week ago.    Encounter Medications:  Outpatient Encounter Medications as of 08/06/2018  Medication Sig  . acetaminophen (TYLENOL) 500 MG tablet Take 500 mg by mouth every 8 (eight) hours as needed for moderate pain.  . carvedilol (COREG) 6.25 MG tablet Take 6.25 mg by mouth 2 (two) times daily with a meal.   . clobetasol ointment (TEMOVATE) 0.05 % Apply 1 application topically 2 (two) times daily as needed (for irritation).   . cyclobenzaprine (FLEXERIL) 5 MG tablet Take 5 mg by mouth 2 (two) times daily as needed for muscle spasms.   Marland Kitchen denosumab (PROLIA) 60 MG/ML SOSY injection Inject 60 mg into the skin every 6 (six) months.  . docusate sodium (COLACE) 100 MG capsule Take 100 mg by mouth daily as needed for mild constipation. Taking one to two a day  . folic acid (FOLVITE) 1 MG tablet Take 1 mg by mouth daily.  Marland Kitchen levothyroxine (SYNTHROID,  LEVOTHROID) 50 MCG tablet Take 50 mcg by mouth daily before breakfast.   . lidocaine (LIDODERM) 5 % Place onto the skin.  Marland Kitchen lisinopril (PRINIVIL,ZESTRIL) 5 MG tablet Take 1 tablet (5 mg total) by mouth daily.  . mirtazapine (REMERON) 15 MG tablet Take 15 mg by mouth at bedtime.  . ondansetron (ZOFRAN-ODT) 4 MG disintegrating tablet TAKE 1 TABLET UNDER THE TONGUE EVERY 8 HOURS AS NEEDED NAUSEA AND VOMITING  . oxyCODONE (ROXICODONE) 5 MG immediate release tablet Take 1 tablet (5 mg total) by mouth every 8 (eight) hours as needed.  . polyethylene glycol (MIRALAX / GLYCOLAX) packet Take 17 g by mouth 2 (two) times daily.  Marland Kitchen senna-docusate (SENOKOT-S) 8.6-50 MG tablet Take 1 tablet by mouth daily.  . traMADol (ULTRAM) 50 MG tablet Take 1 tablet (50 mg total) by mouth every 6 (six) hours as needed for moderate pain or severe pain.   No facility-administered encounter medications on file as of 08/06/2018.     Functional Status:  In your present state of health, do you have any difficulty performing the following activities: 07/15/2018 07/02/2018  Hearing? N -  Vision? N -  Difficulty concentrating or making decisions? Y -  Walking or climbing stairs? N -  Comment chronic back pain  -  Dressing or bathing? Y -  Doing errands, shopping? Y N  Preparing Food and eating ? Y -  Comment family assist  -  Using the Toilet? N -  In the past six months, have you accidently leaked urine? N -  Do you  have problems with loss of bowel control? N -  Managing your Medications? Y -  Managing your Finances? N -  Housekeeping or managing your Housekeeping? Y -  Comment family assisting -  Some recent data might be hidden    Fall/Depression Screening: Fall Risk  07/15/2018 07/10/2018 06/30/2018  Falls in the past year? 1 0 1  Number falls in past yr: 0 - 0  Injury with Fall? 1 - 1  Risk for fall due to : History of fall(s);Impaired balance/gait;Impaired mobility - History of fall(s);Impaired  balance/gait;Medication side effect  Follow up Falls prevention discussed - Falls evaluation completed   PHQ 2/9 Scores 07/15/2018 04/06/2018 01/13/2018 09/19/2017 05/24/2016  PHQ - 2 Score 2 0 0 0 0  PHQ- 9 Score 9 - - - -    Assessment:   Abdominal pain/nausea/nutrition concerns  Patient discussed that she is learning to eat again, having appetite on today, getting her taste back, tolerating foods without increased abdominal pain, or nausea.  Patient discussed having a pure protein bar for breakfast with water and tea. Patient states that she does not like nutrition drinks such as ensure, boost,  Patient discussed plans for lunch for a potato soup. Patient dicussed that she knows that she needs to take in more protein in her diet.  She reports her last reported weight being 85, patient reports having scales at home but does not weigh but will begin to keep a eye on it. Patient will benefit from continued education and support on nutrition intake .   Constipation. Patient discussed having a bowel movement on today and taking stool softeners daily.  Discussed how diet intake affects bowel movements, discussed how natural remedies help with alleviating constipation, such as intake of adequate fluid, foods, vegetables. And taking stools softeners as recommended by PCP.   Chronic Back Pain  Patient discussed some improvement of back pain, reports limiting use of tramadol as recommended at discharge to prevent constipation. She report taking it once on last night ,sleeping well and waking up in good shape with pain controlled this morning.  Patient has follow up MRI on 12/18.  Recent Takotsubo cardiomyopathy / MI  Patient denies chest pain.        Social  Patient currently staying at her mom's home but, reports her family is fixing up her home so that she can return there in the next week. Patient discussed looking forward to being back at her home with her cat and some getting back to some her  routine.  Patient is not able to drive at this time and has to rely on her brother and sister in town , that has work schedules, and one sister from out of town that visits monthly.  Patient is interested in transportation services in the area.  Patient is followed by [facility] home health RN and PT, reports therapy visit on today.   Medications: Currently patient sister managing medication using pill organizer that she fills. Patient want to become a little more independent with self care , but feels that she still needs assistance with managing medications.  Discussed pill packaging , patient is agreeable to discussing with [facility] pharmacist.  Patient unable to review medications from discharge, she request that I speak with her sister [name] later today   Addendum Returned call to patient sister [name], HIPAA verified. She was able to review medications that patient is taking since discharge.   [name] discussed family plans to prepare patient home for her  to return to in the next week. She agrees that patient looks and is doing  much better.  They are planning for patient to remain in her home during the day and return to her moms' home to stay at night. Sister discussed another family member will  also be present in the home as patient will not be able to attend to her mother that has Alzheimer's . Patient has been staying at he mothers home at night for the last 3 years since her spouse passed.   Patient sister discussed patient has looked into going into an assisted living at [facility], she found out recently that they have a unit open, facility has agreed to hold unit for 30 days.  [name] states that patient has not fully made up her mind if that what she wants to do. She discussed that patient has prepared for this and it will be a out of pocket expense.  [name] states that she plans to prepare easy for patient to access meals to have in her home.   [name] discussed that MD at [facility]  states patient will need referral to nutrition counseling as well as psychotherapy  they are awaiting phone calls regarding appointments patient is agreeable to these referral.   Patient sister [name] states for now family with provide patient transportation to medical appointments because someone will need to accompany her. Sister discussed that she is making a calendar for all of patient future appointments to make all family aware.   Plan:  Will plan call to [name] office to schedule post discharge visit.  Able to schedule PCP visit for 12/16 at 2:20, family will provide transportation.  Will place pharmacy consult for medication review and pill packaging.  Will place LCSW consult for transportation needs, and possible plans for Assisted living.   Will send PCP telephone visit note.  Will plan follow up call in the next week .      [facility] CM Care Plan Problem One     Most Recent Value  Care Plan Problem One  High risk for readmission related to readmission to  hospital for abdominal pain, constipation,FTT, in additon to recent admission for Takotsubo cardiomyopathy    Role Documenting the Problem One  Care Management Coordinator  Care Plan for Problem One  Active  [facility] Long Term Goal   Patient will not experience a hospital readmission over the next 31 days  [facility] Long Term Goal Start Date  08/06/18 Staci Righter restarted related to admission ]  Interventions for Problem One Long Term Goal  Discussed adherence to discharge instructions reviewed with family, encouraged takng medications as prescribed,.   [facility] CM Short Term Goal #1   Patient will report attending all medical appointments over the next 30 days   [facility] CM Short Term Goal #1 Start Date  08/06/18  Interventions for Short Term Goal #1  Advised attending post hospital PCP visit , reviewed importance of attending appointment, scheduled visit for patient and updated patient and sister .   [facility] CM Short Term Goal #2   Patient will be able to  report participation in daily physical therapy exercises over the next 30 days   [facility] CM Short Term Goal #2 Start Date  08/06/18  Interventions for Short Term Goal #2  Reviewed home health services in place for therapy and advised regarding benefit of continuing therapy to increase strenght and balance   [facility] CM Short Term Goal #3  Over the next 30 days patient will  be able to report increased protein nutriton intake   [facility] CM Short Term Goal #3 Start Date  08/06/18  Interventions for Short Tern Goal #3  Discussed with patient importance of nutrition , protein intake, reviewed protein foods and snack options, discussed beginning to keep a food diary of meal intake     [facility] CM Care Plan Problem Two     Most Recent Value  Care Plan Problem Two  Chronic back pain related to arthritis   Role Documenting the Problem Two  Care Management Coordinator  Care Plan for Problem Two  Active  [facility] CM Short Term Goal #1   Patient will be able to verbalize improvemnt of back pain over the next 30 days   [facility] CM Short Term Goal #1 Start Date  08/06/18 Staci Righter restart ]  Interventions for Short Term Goal #2   Discussed with patient following , up all medical follow up MRI , taking medications as prescribed.       [name], RN, [facility] Care Management,Care Management Coordinator  [phone]- Mobile [phone]- Toll Free Main Office

## 2018-08-07 ENCOUNTER — Inpatient Hospital Stay: Payer: Medicare Other | Admitting: Oncology

## 2018-08-07 ENCOUNTER — Inpatient Hospital Stay: Payer: Medicare Other

## 2018-08-07 ENCOUNTER — Telehealth: Payer: Self-pay

## 2018-08-07 NOTE — Telephone Encounter (Signed)
Transition Care Management Follow-up Telephone Call  Date of discharge and from where: [facility] 08/05/18 (dischare instructions signed 12/10 pt not released until 12/11)  How have you been since you were released from the hospital? Pt doing much better since discharge and current pain level 4 out of 10  Any questions or concerns? No   Items Reviewed:  Did the pt receive and understand the discharge instructions provided? Yes   Medications obtained and verified? Yes   Any new allergies since your discharge? No   Dietary orders reviewed? Yes  Do you have support at home? Yes   Functional Questionnaire: (I = Independent and D = Dependent) ADLs: D  Bathing/Dressing- D  Meal Prep- D  Eating- I  Maintaining continence- I  Transferring/Ambulation- D  Managing Meds- D  Follow up appointments reviewed:   PCP Hospital f/u appt confirmed? Yes  Scheduled to see Dr. Judithann Graves on 08/10/18 @ 2:20.  Are transportation arrangements needed? No   If their condition worsens, is the pt aware to call PCP or go to the Emergency Dept.? Yes  Was the patient provided with contact information for the PCP's office or ED? Yes  Was to pt encouraged to call back with questions or concerns? Yes  Pt states she is doing much better since discharge. I then spoke to her sister [name] to verify medications and confirmed that they have been contacted by [facility] for home health. Pt does most thing as independently as she can but does require assistance. She has also been referred to psychology regarding her eating habits due to little desire to eat. [name] followed up with [facility] yesterday regarding this referral psychology services available within this area.

## 2018-08-10 ENCOUNTER — Ambulatory Visit (INDEPENDENT_AMBULATORY_CARE_PROVIDER_SITE_OTHER): Payer: Medicare Other | Admitting: Internal Medicine

## 2018-08-10 ENCOUNTER — Encounter: Payer: Self-pay | Admitting: Internal Medicine

## 2018-08-10 VITALS — BP 128/72 | HR 76 | Ht 59.0 in | Wt 84.4 lb

## 2018-08-10 DIAGNOSIS — I6930 Unspecified sequelae of cerebral infarction: Secondary | ICD-10-CM | POA: Diagnosis not present

## 2018-08-10 DIAGNOSIS — E43 Unspecified severe protein-calorie malnutrition: Secondary | ICD-10-CM

## 2018-08-10 DIAGNOSIS — I5181 Takotsubo syndrome: Secondary | ICD-10-CM | POA: Insufficient documentation

## 2018-08-10 DIAGNOSIS — K5901 Slow transit constipation: Secondary | ICD-10-CM | POA: Diagnosis not present

## 2018-08-10 DIAGNOSIS — I5032 Chronic diastolic (congestive) heart failure: Secondary | ICD-10-CM | POA: Diagnosis not present

## 2018-08-10 DIAGNOSIS — F39 Unspecified mood [affective] disorder: Secondary | ICD-10-CM | POA: Diagnosis not present

## 2018-08-10 NOTE — Progress Notes (Signed)
Date:  08/10/2018   Name:  Katherine Baird   DOB:  22-Dec-1950   MRN:  [id number]   Chief Complaint: Hospitalization Follow-up (Follow up from [facility]. Feeling much better. Getting appetite back. Patient is with [name] today. ) Hospital follow up from [facility].  Admitted 07/29/18 to 08/04/18.  She received TOC call on 08/07/18. D/C summary: Diagnoses:  Vomiting, intractability of vomiting not specified, presence of nausea not specified, unspecified vomiting type (Primary Dx);  Nausea and vomiting, intractability of vomiting not specified, unspecified vomiting type;  Abdominal pain, unspecified abdominal location;  Hypotension, unspecified hypotension type;  Dehydration;  AKI (acute kidney injury) (CMS-HCC);  Failure to thrive in adult  ## Medications:  -- Continue Remeron15 mg qhs at time of discharge, can be managed by PCP  ## Medical Decision Making Capacity:  -- A formal capacity assessement was not performed as a part of this evaluation. If specific capacity questions arise, please contact our team as below.   ## Disposition:  -- There are no psychiatric contraindications to discharging this patient when medically appropriate. -- We have encouraged the patient to establish outpatient mental health care by contacting either their insurance carrier or their local Managed Care Organization, [employer] (Phone [phone]; Crisis Line [phone])  -- Provided information to find local therapist in patient's AVS.  ## Behavioral / Environmental:  -- No recommendations at this time.   AKI - resolved, pre-renal related to volume depletion. Improving on repeat labs after IVF. Creatinine peak 2.04 (in the setting of very low muscle mass), now BUN 13, Cr 0.41   Chronic/remote lacunar infarcts. Lipid panel impressively low r/t her malnutrition. I do not feel statin is indicated and higher risk of symptoms given her cachexia. - start ASA 81 - echocardiogram poor  study, unable to provide much information due to difficult windows (likely small rib spaces)    Technically difficult study due to chest wall/lung interference  Normal left ventricular systolic function, ejection fraction > 55%  Normal right ventricular systolic function  Back Pain  This is a chronic problem. The current episode started more than 1 month ago. The problem has been gradually improving since onset. Pertinent negatives include no chest pain, dysuria, fever or headaches.   Overall she reports improvement, drinking water and tea, eating slightly more.  Bowel habits are more regular.  Still having back pain - MRI is scheduled for Wednesday.  Her mood is still decreased but her sister in law believes that it is somewhat better on Remeron. She is now taking ASA due to lacunar infarcts seen on MRI brain.  Review of Systems  Constitutional: Positive for appetite change. Negative for fatigue and fever.  HENT: Negative for trouble swallowing.   Respiratory: Negative for chest tightness, shortness of breath and wheezing.   Cardiovascular: Negative for chest pain and leg swelling.  Gastrointestinal: Positive for constipation. Negative for diarrhea and nausea.  Genitourinary: Negative for dysuria.  Musculoskeletal: Positive for back pain.  Allergic/Immunologic: Positive for food allergies. Negative for environmental allergies.  Neurological: Negative for dizziness and headaches.  Hematological: Negative for adenopathy.  Psychiatric/Behavioral: Positive for dysphoric mood. Negative for sleep disturbance. The patient is not nervous/anxious.     Patient Active Problem List   Diagnosis Date Noted  . Constipation 07/30/2018  . Bladder infection 07/14/2018  . NSTEMI (non-ST elevated myocardial infarction) (HCC) 07/02/2018  . Acute midline thoracic back pain 06/23/2018  . Iron deficiency anemia 02/08/2018  . Fatigue 01/13/2018  .  Chronic diastolic heart failure (HCC) 12/17/2017  .  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 Syncope 02/24/2017  . Hypothyroidism 05/24/2016  . HLD (hyperlipidemia) 01/27/2015  . Paroxysmal digital cyanosis 01/27/2015  . 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 Essential (primary) hypertension 01/11/2015  . Rheumatoid arthritis involving multiple joints (HCC) 01/11/2015  . 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 Levofloxacin Nausea And Vomiting  . Doxycycline   . Erythromycin   . Gabapentin   . Guaifenesin     "Felt sick" and worsening of symptoms  . Nitrofuran Derivatives   . Prednisone     muscle weakness/pain  . Sulfa Antibiotics   . Tetracycline   . Azithromycin Rash    Severe rash with peeling  . Carvedilol Other (See Comments)    Dose problem with higher doses    Past Surgical History:  Procedure Laterality Date  . LAPAROSCOPIC HYSTERECTOMY  2001   total  . LEFT HEART CATH N/A 07/03/2018   Procedure: Left Heart Cath with Coronary Angiography;  Surgeon: Marcina Millard, MD;  Location: [facility] INVASIVE CV LAB;  Service: Cardiovascular;  Laterality: N/A;    Social History   Tobacco Use  . Smoking status: Never Smoker  . Smokeless tobacco: Never Used  Substance Use Topics  . Alcohol use: No    Alcohol/week: 0.0 standard drinks  . Drug use: No     Medication list has been reviewed and updated.  Current Meds  Medication Sig  . acetaminophen (TYLENOL) 500 MG tablet Take 500 mg by mouth every 8 (eight) hours as needed for moderate pain.  Marland Kitchen aspirin EC 81 MG tablet Take 81 mg by mouth daily.  . carvedilol (COREG) 6.25 MG tablet Take 6.25 mg by mouth 2 (two) times daily with a meal.   . clobetasol ointment (TEMOVATE) 0.05 % Apply 1 application topically 2 (two) times daily as needed (for irritation).   Marland Kitchen denosumab (PROLIA) 60 MG/ML SOSY injection Inject 60 mg into the skin every 6 (six) months.  . docusate sodium (COLACE) 100 MG capsule Take 100 mg by  mouth daily as needed for mild constipation. Taking one to two a day  . folic acid (FOLVITE) 1 MG tablet Take 1 mg by mouth daily.  Marland Kitchen levothyroxine (SYNTHROID, LEVOTHROID) 50 MCG tablet Take 50 mcg by mouth daily before breakfast.   . mirtazapine (REMERON) 15 MG tablet Take 15 mg by mouth at bedtime.  . polyethylene glycol (MIRALAX / GLYCOLAX) packet Take 17 g by mouth 2 (two) times daily.  . traMADol (ULTRAM) 50 MG tablet Take 1 tablet (50 mg total) by mouth every 6 (six) hours as needed for moderate pain or severe pain. (Patient taking differently: Take 50 mg by mouth every 6 (six) hours as needed for moderate pain or severe pain. )    PHQ 2/9 Scores 07/15/2018 04/06/2018 01/13/2018 09/19/2017  PHQ - 2 Score 2 0 0 0  PHQ- 9 Score 9 - - -   Wt Readings from Last 3 Encounters:  08/10/18 84 lb 6.4 oz (38.3 kg)  07/15/18 96 lb (43.5 kg)  07/08/18 96 lb 11.2 oz (43.9 kg)    Physical Exam Vitals signs and nursing note reviewed.  Constitutional:      General: She is not in acute distress.    Appearance: She is cachectic.  HENT:     Head: Normocephalic and atraumatic.  Cardiovascular:     Rate and Rhythm: Normal rate and regular  rhythm.     Pulses: Normal pulses.     Heart sounds: Heart sounds are distant.  Pulmonary:     Effort: Pulmonary effort is normal. No respiratory distress.     Breath sounds: Normal breath sounds and air entry.  Musculoskeletal: Normal range of motion.  Skin:    General: Skin is warm and dry.     Findings: No rash.  Neurological:     Mental Status: She is alert and oriented to person, place, and time.  Psychiatric:        Mood and Affect: Mood is depressed.        Speech: Speech normal.        Behavior: Behavior normal.        Thought Content: Thought content normal.     BP 128/72 (BP Location: Left Arm, Patient Position: Sitting, Cuff Size: Normal)   Pulse 76   Ht 4\' 11"  (1.499 m)   Wt 84 lb 6.4 oz (38.3 kg)   SpO2 100%   BMI 17.05 kg/m    Assessment and Plan: 1. Slow transit constipation Continue miralax Increase fluids to avoid dehydration  2. Chronic diastolic heart failure (HCC) ECHO showed ~ 55% EF Follow up with Cardiology in May - Ambulatory referral to Home Health  3. Severe protein-calorie malnutrition (HCC) Discussed high protein food choices, increased fiber (pt diet limited - no dairy or eggs) - Ambulatory referral to Home Health  4. Mood disorder (HCC) Continue Remeron Recheck in 6 weeks - Ambulatory referral to Home Health  5. Late effect of lacunar infarction ASA 81 mg daily   Partially dictated using June. Any errors are unintentional.  Animal nutritionist, MD [facility]  08/10/2018

## 2018-08-11 ENCOUNTER — Other Ambulatory Visit: Payer: Self-pay | Admitting: Pharmacist

## 2018-08-11 DIAGNOSIS — M0689 Other specified rheumatoid arthritis, multiple sites: Secondary | ICD-10-CM | POA: Diagnosis not present

## 2018-08-11 DIAGNOSIS — I214 Non-ST elevation (NSTEMI) myocardial infarction: Secondary | ICD-10-CM | POA: Diagnosis not present

## 2018-08-11 DIAGNOSIS — I5032 Chronic diastolic (congestive) heart failure: Secondary | ICD-10-CM | POA: Diagnosis not present

## 2018-08-11 DIAGNOSIS — E039 Hypothyroidism, unspecified: Secondary | ICD-10-CM | POA: Diagnosis not present

## 2018-08-11 DIAGNOSIS — D509 Iron deficiency anemia, unspecified: Secondary | ICD-10-CM | POA: Diagnosis not present

## 2018-08-11 DIAGNOSIS — I110 Hypertensive heart disease with heart failure: Secondary | ICD-10-CM | POA: Diagnosis not present

## 2018-08-11 DIAGNOSIS — Z9181 History of falling: Secondary | ICD-10-CM | POA: Diagnosis not present

## 2018-08-11 DIAGNOSIS — I480 Paroxysmal atrial fibrillation: Secondary | ICD-10-CM | POA: Diagnosis not present

## 2018-08-11 DIAGNOSIS — N390 Urinary tract infection, site not specified: Secondary | ICD-10-CM | POA: Diagnosis not present

## 2018-08-11 DIAGNOSIS — M810 Age-related osteoporosis without current pathological fracture: Secondary | ICD-10-CM | POA: Diagnosis not present

## 2018-08-11 DIAGNOSIS — Z792 Long term (current) use of antibiotics: Secondary | ICD-10-CM | POA: Diagnosis not present

## 2018-08-11 NOTE — Patient Outreach (Signed)
[facility]) Care Management  [facility]   08/11/2018  Katherine Baird Nov 08, 1950 [id number]   Reason for referral: Medication reconciliation, review compliance packaging   Referral source: [facility] RN Current insurance: [facility]  PMHx includes but not limited to: Takotsubo cardiomyopathy, chronic diastolic HF, HTN, HLD, RA, hypothyroidism, recent NSTEMI.   Outreach:  Patient reports that her sisters both help with filling weekly pillbox.  She reports she is feeling much better after recent hospitalization for FTT / weight loss, constipation with concern for depressive illness vs neurocognitive disorder, started on mirtazapine and miralax at discharge.   Patient had office visit yesterday with PCP and several medications stopped.   Objective: Lab Results  Component Value Date   CREATININE 0.47 07/08/2018   CREATININE 0.58 07/03/2018   CREATININE 0.68 07/02/2018    No results found for: HGBA1C  Lipid Panel  No results found for: CHOL, TRIG, HDL, CHOLHDL, VLDL, LDLCALC, LDLDIRECT  BP Readings from Last 3 Encounters:  08/10/18 128/72  07/15/18 98/64  07/08/18 (!) 173/94    Allergies  Allergen Reactions  . Levofloxacin Nausea And Vomiting  . Doxycycline   . Erythromycin   . Gabapentin   . Guaifenesin     "Felt sick" and worsening of symptoms  . Nitrofuran Derivatives   . Prednisone     muscle weakness/pain  . Sulfa Antibiotics   . Tetracycline   . Azithromycin Rash    Severe rash with peeling  . Carvedilol Other (See Comments)    Dose problem with higher doses    Medications Reviewed Today    Reviewed by Reubin Milan, MD (Physician) on 08/10/18 at 1513  Med List Status: <None>  Medication Order Taking? Sig Documenting Provider Last Dose Status Informant  acetaminophen (TYLENOL) 500 MG tablet [id number] Yes Take 500 mg by mouth every 8 (eight) hours as needed for moderate pain. [provider] Taking Active   aspirin EC 81 MG tablet  [id number] Yes Take 81 mg by mouth daily. [provider] Taking Active   carvedilol (COREG) 6.25 MG tablet [id number] Yes Take 6.25 mg by mouth 2 (two) times daily with a meal.  [provider] Taking Active Family Member           Med Note [facility], TINA A   Wed Dec 17, 2017 11:43 AM)    clobetasol ointment (TEMOVATE) 0.05 % [id number] Yes Apply 1 application topically 2 (two) times daily as needed (for irritation).  [provider] Taking Active Family Member  denosumab (PROLIA) 60 MG/ML SOSY injection [id number] Yes Inject 60 mg into the skin every 6 (six) months. [provider] Taking Active Family Member  docusate sodium (COLACE) 100 MG capsule [id number] Yes Take 100 mg by mouth daily as needed for mild constipation. Taking one to two a day [provider] Taking Active   folic acid (FOLVITE) 1 MG tablet [id number] Yes Take 1 mg by mouth daily. [provider] Taking Active Family Member  levothyroxine (SYNTHROID, LEVOTHROID) 50 MCG tablet [id number] Yes Take 50 mcg by mouth daily before breakfast.  [provider] Taking Active Family Member  mirtazapine (REMERON) 15 MG tablet [id number] Yes Take 15 mg by mouth at bedtime. [provider] Taking Active   polyethylene glycol [facility] / GLYCOLAX) packet [id number] Yes Take 17 g by mouth 2 (two) times daily. [provider] Taking Active   traMADol (ULTRAM) 50 MG tablet [id number] Yes Take 1 tablet (50 mg total) by mouth every 6 (  six) hours as needed for moderate pain or severe pain.  Patient taking differently:  Take 50 mg by mouth every 6 (six) hours as needed for moderate pain or severe pain.    Houston Siren, MD Taking Active Family Member          Assessment:  Drugs sorted by system:  Neurologic/Psychologic:mirtazapine  Cardiovascular: aspirin, carvedilol  Gastrointestinal:docusate, polyethylene glycol  Endocrine: levothyroxine  Topical: clobetasol  ointment  Pain: acetaminophen, tramadol  Vitamins/Minerals/Supplements:folic acid  Miscellaneous:denosumab  Medication Review Findings:  . Medications adjusted yesterday by PCP.  Patient voiced understanding of new regimen.  She states she does not believe she needs compliance packs anymore as she only has 4 oral medications in the AM and 1 in the PM scheduled.  We discussed that her current pharmacy may offer this for her if she wants to trial it later on.  Patient voiced understanding.  . Understands regimen and indications for medications . No medication questions or concerns from patient or sisters today  Plan: -Will close [facility] pharmacy case -Provided my contact information to patient, am happy to assist in the future as needed  Haynes Hoehn, PharmD, [facility] Clinical Pharmacist [facility] [employer] [phone]

## 2018-08-12 ENCOUNTER — Other Ambulatory Visit: Payer: Self-pay | Admitting: *Deleted

## 2018-08-12 ENCOUNTER — Ambulatory Visit
Admission: RE | Admit: 2018-08-12 | Discharge: 2018-08-12 | Disposition: A | Discharge status: Home / Self Care | Payer: Medicare Other | Source: Ambulatory Visit | Attending: Rheumatology | Admitting: Rheumatology

## 2018-08-12 ENCOUNTER — Telehealth: Payer: Self-pay | Admitting: Internal Medicine

## 2018-08-12 DIAGNOSIS — M5124 Other intervertebral disc displacement, thoracic region: Secondary | ICD-10-CM | POA: Diagnosis not present

## 2018-08-12 DIAGNOSIS — M546 Pain in thoracic spine: Secondary | ICD-10-CM | POA: Diagnosis present

## 2018-08-12 DIAGNOSIS — M47814 Spondylosis without myelopathy or radiculopathy, thoracic region: Secondary | ICD-10-CM | POA: Insufficient documentation

## 2018-08-12 NOTE — Patient Outreach (Signed)
[facility]) Care Management  08/12/2018  Katherine Baird [birth year] [id number]   Initial Contact  Patient referred to this social worker to assess for community resource needs. There was no answer.  This Child psychotherapist to make second attempt to reach patient within 3-4 business days.   Adriana Reams [facility] Care Management [phone]

## 2018-08-12 NOTE — Telephone Encounter (Signed)
Tried to call patient to schedule AWV-I.  No answer.  No answer machine.

## 2018-08-13 DIAGNOSIS — M0689 Other specified rheumatoid arthritis, multiple sites: Secondary | ICD-10-CM | POA: Diagnosis not present

## 2018-08-13 DIAGNOSIS — I5032 Chronic diastolic (congestive) heart failure: Secondary | ICD-10-CM | POA: Diagnosis not present

## 2018-08-13 DIAGNOSIS — E039 Hypothyroidism, unspecified: Secondary | ICD-10-CM | POA: Diagnosis not present

## 2018-08-13 DIAGNOSIS — Z792 Long term (current) use of antibiotics: Secondary | ICD-10-CM | POA: Diagnosis not present

## 2018-08-13 DIAGNOSIS — D509 Iron deficiency anemia, unspecified: Secondary | ICD-10-CM | POA: Diagnosis not present

## 2018-08-13 DIAGNOSIS — Z9181 History of falling: Secondary | ICD-10-CM | POA: Diagnosis not present

## 2018-08-13 DIAGNOSIS — M810 Age-related osteoporosis without current pathological fracture: Secondary | ICD-10-CM | POA: Diagnosis not present

## 2018-08-13 DIAGNOSIS — I110 Hypertensive heart disease with heart failure: Secondary | ICD-10-CM | POA: Diagnosis not present

## 2018-08-13 DIAGNOSIS — I480 Paroxysmal atrial fibrillation: Secondary | ICD-10-CM | POA: Diagnosis not present

## 2018-08-13 DIAGNOSIS — I214 Non-ST elevation (NSTEMI) myocardial infarction: Secondary | ICD-10-CM | POA: Diagnosis not present

## 2018-08-13 DIAGNOSIS — N390 Urinary tract infection, site not specified: Secondary | ICD-10-CM | POA: Diagnosis not present

## 2018-08-14 ENCOUNTER — Other Ambulatory Visit: Payer: Self-pay | Admitting: *Deleted

## 2018-08-14 ENCOUNTER — Other Ambulatory Visit: Payer: Self-pay

## 2018-08-14 DIAGNOSIS — E440 Moderate protein-calorie malnutrition: Secondary | ICD-10-CM

## 2018-08-14 NOTE — Patient Outreach (Signed)
[facility]) Care Management  08/14/2018  Katherine Baird 12/15/1950 [id number]   Transition of care by PCP office Telephone follow up call   Referral received : 11/8 Referral source : [facility] Care Manager Referral reason : [name] inpatient admission , 117-11/8 NSTEMI , cardiac cath results : normal coronary anatomy , overall preserved left ventricular function ,Takotsubo cardiomyopathy  Insurance : [facility]   PMHx noted : significant for Hypertension , Diastolic Heart failure, recent UTI , back pain,iron deficiency , osteoporosis  ED visits 11/3 Dx: Chest pain  ED visit 11/22 at [facility] ED Dx : Pyelonephritis , back pain  ED visit 11/24 at [facility] ED Dx: Abdominal pain, Constipation. Referral to [facility] spine center regarding T4 compression fracture, await phone call within a week regarding scheduling an appointment . Admission to [facility] 12/4- 12/10 Dx: Vomiting intractability, abdominal pain, dehydration, constipation, FTT, chronic/remote Lacunar infarcts.    Successful telephone outreach call to patient, she discussed not feeling as well for the last 2 days since she had spinal scan done . Patient discussed that she has called office to get results.  Patient discussed back pain 7/10, she is taking Tramadol at least once a day,and other wise using tylenol.  Patient further discussed :  Deconditioning/weakness She reports that Home Health PT/OT visited on yesterday and they report that she is doing well Patient reports not having dizziness.  Constipation. Patient reports having small loose bowel movements daily. She denies having nausea or abdomen pain.  Patient discussed taking miralax on yesterday.  Review of measures to prevent constipation, food intake.Reinforced importance of routine bowel movements, taking medications as prescribed to prevent constipation.  Weight loss /Nutrition  Patient discussed appetite not as good for last 2 days ,  she denies nausea, discussed that she had started to get hungry but not for the last 2 days. Patient states that she forces herself to eat , she has not got her taste back. Patient reports today's weight is 85 lbs.  Patient reports that she was eating a nutrition/protein bar daily but now it is every other day. Patient will benefit from nutrition consult related to weight loss and malnutrition, as recommended at discharge from [facility].  Depression  Patient discussed that she was so sick during last admission as the reason that she felt so low. She discussed feeling some better now.  Discussed recommendation for mental health, psychology follow up , she is agreeable and states that she has not heard anything yet. Patient discussed concerns about family commitments and transportation. Discussed with patient, family is agreeable to assisting with transportation to appointments for now as prior communication with her sister [name]. Reminded [facility] social worker attempting to make contact with her.  Social  Patient is still staying her mom's home, but plans to return to her home possibly in the next week. She states that she will be stay at her home at night, and will be at her mom's home at 2 pm till 7 am, her mothers sitters/ and her sister will provide transportation.  Patient discussed considering moving to the [place] , assisted living she has about a month to make up her mind. Patient discussed she plans to follow up with them in the next week.  Patient gave agreement that I can contact her sister [name].   Placed call to [name], HIPAA verified. [name] discussed with her patient voiced concern regarding transportation. [name] discussed family committed to providing transportation to appointments and attending  with patient due to current condition. Discussed that I reassured patient on this based on prior conversations.  [name] discussed that patient attending mental health therapy  is also at the  top of list, patient has had a lot of loss in the last year and she doesn't feel patient talks about it.  [name] discussed that patient has lots of family support her sister or brother checks her daily. She shared patient sister visit on yesterday to reinforced patient regarding taking Miralax for constipation and watched her drink it.    Plan Placed call to [name] office regarding referral to nutrition counseling related to weight loss, and malnutrition. I spoke with Chassidy that will forward message to [name].  Will update [facility] LCSW regarding patient best contact number.  Will send EMMI on constipation .  Will plan home visit in the next 3 weeks.   [facility] CM Care Plan Problem One     Most Recent Value  Care Plan Problem One  High risk for readmission related to readmission to  hospital for abdominal pain, constipation,FTT, in additon to recent admission for Takotsubo cardiomyopathy    Role Documenting the Problem One  Care Management Coordinator  Care Plan for Problem One  Active  [facility] Long Term Goal   Patient will not experience a hospital readmission over the next 31 days  [facility] Long Term Goal Start Date  08/06/18 Staci Righter restarted related to admission ]  Interventions for Problem One Long Term Goal  Reviewed current clinical state,advised regarding taking medications as prescribed., notifying MD sooner of new concerns worsening symptoms to arrange office visit and avoid readmission .   [facility] CM Short Term Goal #1   Patient will report attending all medical appointments over the next 30 days   [facility] CM Short Term Goal #1 Start Date  08/06/18  Interventions for Short Term Goal #1  Reinforced attending all medical appointment for follow up of medical conditions and review of plan.   [facility] CM Short Term Goal #2   Patient will be able to report participation in daily physical therapy exercises over the next 30 days   [facility] CM Short Term Goal #2 Start Date  08/06/18  Interventions for Short Term  Goal #2  Reinforced continued participation in therapy, and working on home exercise,. Reinforced use of walker for safety support .   [facility] CM Short Term Goal #3  Over the next 30 days patient will be able to report increased protein nutriton intake   [facility] CM Short Term Goal #3 Start Date  08/06/18  Interventions for Short Tern Goal #3  Reviewed recent nutrition intake and discusse examples of protein to include in diet, peanut butter, nuts, seeds, eggs. Nutrition supplements such as nutrition bars drink options. Placed call to PCP office regarding nutrition consult with RD.,   [facility] CM Short Term Goal #4  Over the next 30 days patient will be able to verbalize 3 measure to prevent constipation.   [facility] CM Short Term Goal #4 Start Date  08/14/18  Interventions for Short Term Goal #4  Advised regarding measures to prevent constipation, importance of eating/drinking regular meals , discussed how activity helps relieve constipation, Discussed foods to include in diet, beans, fruit fresh and dried, and vegetables. Will send EMMI on constipation .      [facility] CM Care Plan Problem Two     Most Recent Value  Care Plan Problem Two  Chronic back pain related to arthritis   Role Documenting the Problem  Two  Care Management Coordinator  Care Plan for Problem Two  Active  [facility] CM Short Term Goal #1   Patient will be able to verbalize improvemnt of back pain over the next 30 days   [facility] CM Short Term Goal #1 Start Date  08/06/18 Staci Righter restart ]  Interventions for Short Term Goal #2   Reviewed current clinical state, discussed pain relieving measures , encouraged follow up with Dr.Kernodle regarding result of recent test and keep all follow up. Advised notifying MD of increased pain.        [name], RN, [facility] Care Management,Care Management Coordinator  [phone]- Mobile [phone]- Toll Free Main Office

## 2018-08-14 NOTE — Progress Notes (Signed)
[name] from [facility] called stating [facility] requested that Dr Judithann Graves place referral for Nutritional Services. Spoke with Selena Batten and informed her we will place referral for patient.   Placed referral to [facility] for Malnutrition.

## 2018-08-14 NOTE — Telephone Encounter (Signed)
Tried to call patient again to schedule AWV-I.  No answer and machine states memory is full.lec

## 2018-08-15 DIAGNOSIS — M0689 Other specified rheumatoid arthritis, multiple sites: Secondary | ICD-10-CM | POA: Diagnosis not present

## 2018-08-15 DIAGNOSIS — I480 Paroxysmal atrial fibrillation: Secondary | ICD-10-CM | POA: Diagnosis not present

## 2018-08-15 DIAGNOSIS — I110 Hypertensive heart disease with heart failure: Secondary | ICD-10-CM | POA: Diagnosis not present

## 2018-08-15 DIAGNOSIS — E039 Hypothyroidism, unspecified: Secondary | ICD-10-CM | POA: Diagnosis not present

## 2018-08-15 DIAGNOSIS — D509 Iron deficiency anemia, unspecified: Secondary | ICD-10-CM | POA: Diagnosis not present

## 2018-08-15 DIAGNOSIS — N390 Urinary tract infection, site not specified: Secondary | ICD-10-CM | POA: Diagnosis not present

## 2018-08-15 DIAGNOSIS — I5032 Chronic diastolic (congestive) heart failure: Secondary | ICD-10-CM | POA: Diagnosis not present

## 2018-08-15 DIAGNOSIS — Z9181 History of falling: Secondary | ICD-10-CM | POA: Diagnosis not present

## 2018-08-15 DIAGNOSIS — Z792 Long term (current) use of antibiotics: Secondary | ICD-10-CM | POA: Diagnosis not present

## 2018-08-15 DIAGNOSIS — I214 Non-ST elevation (NSTEMI) myocardial infarction: Secondary | ICD-10-CM | POA: Diagnosis not present

## 2018-08-15 DIAGNOSIS — M810 Age-related osteoporosis without current pathological fracture: Secondary | ICD-10-CM | POA: Diagnosis not present

## 2018-08-17 ENCOUNTER — Ambulatory Visit: Payer: Self-pay | Admitting: *Deleted

## 2018-08-17 ENCOUNTER — Other Ambulatory Visit: Payer: Self-pay | Admitting: *Deleted

## 2018-08-17 NOTE — Patient Outreach (Signed)
[facility]) Care Management  08/17/2018  Katherine Baird 12-31-1950 [id number]    Second attempt   Second attempt to reach patient to discuss mental health and transportation needs. This social worker made attempts to contact patient on both numbers listed, there was no answer, The first number was busy X 2 and there was no voicemail set up on the alternate number listed.   Plan: This social worker will make a third attempt to reach patient within 3-4 business days.   Adriana Reams [facility] Care Management [phone]

## 2018-08-18 ENCOUNTER — Other Ambulatory Visit: Payer: Self-pay | Admitting: *Deleted

## 2018-08-18 DIAGNOSIS — N390 Urinary tract infection, site not specified: Secondary | ICD-10-CM | POA: Diagnosis not present

## 2018-08-18 DIAGNOSIS — M0689 Other specified rheumatoid arthritis, multiple sites: Secondary | ICD-10-CM | POA: Diagnosis not present

## 2018-08-18 DIAGNOSIS — D509 Iron deficiency anemia, unspecified: Secondary | ICD-10-CM | POA: Diagnosis not present

## 2018-08-18 DIAGNOSIS — I110 Hypertensive heart disease with heart failure: Secondary | ICD-10-CM | POA: Diagnosis not present

## 2018-08-18 DIAGNOSIS — I214 Non-ST elevation (NSTEMI) myocardial infarction: Secondary | ICD-10-CM | POA: Diagnosis not present

## 2018-08-18 DIAGNOSIS — I480 Paroxysmal atrial fibrillation: Secondary | ICD-10-CM | POA: Diagnosis not present

## 2018-08-18 DIAGNOSIS — M810 Age-related osteoporosis without current pathological fracture: Secondary | ICD-10-CM | POA: Diagnosis not present

## 2018-08-18 DIAGNOSIS — E039 Hypothyroidism, unspecified: Secondary | ICD-10-CM | POA: Diagnosis not present

## 2018-08-18 DIAGNOSIS — Z792 Long term (current) use of antibiotics: Secondary | ICD-10-CM | POA: Diagnosis not present

## 2018-08-18 DIAGNOSIS — Z9181 History of falling: Secondary | ICD-10-CM | POA: Diagnosis not present

## 2018-08-18 DIAGNOSIS — I5032 Chronic diastolic (congestive) heart failure: Secondary | ICD-10-CM | POA: Diagnosis not present

## 2018-08-18 NOTE — Patient Outreach (Signed)
[facility]) Care Management  08/18/2018  Kebra Lowrimore May 13, 1951 [id number]   Phone call to patient to follow up on in home help needs as well as mental health follow up. Per patient, she is currently receiving HH services through [facility]. Patient states that she is receiving PT and OT and a bath aid came this morning to help with her bath. Per patietn, she thought that she could do it on her own but was afraid that she would fall if she did not have help.  Patient is currently staying with her mother who has been diagnosed with Dementia but will eventually be going back to her own home during the day soon. Per patient , she has not been home since she was discharged from the hospital. Per patient, right now her sister and brother are staying at night with patient's mother until patient becomes stronger to resume care. Per patient, denies depressed mood at this time. She stated that she had been restricting her diet due to extreme back pain,however now that this is starting to resolve, her appetite is beginning to return. Patient state that  the PT and OT has been very helpful. Patient is hopeful that a provider can be found in [place] as she had  to arrange transportation      Plan: This Child psychotherapist will identify a in network provider in her area and will request referral from patient's provider's office.   Adriana Reams [facility] Care Management [phone]

## 2018-08-20 ENCOUNTER — Other Ambulatory Visit: Payer: Self-pay | Admitting: *Deleted

## 2018-08-20 DIAGNOSIS — I480 Paroxysmal atrial fibrillation: Secondary | ICD-10-CM | POA: Diagnosis not present

## 2018-08-20 DIAGNOSIS — M0689 Other specified rheumatoid arthritis, multiple sites: Secondary | ICD-10-CM | POA: Diagnosis not present

## 2018-08-20 DIAGNOSIS — E039 Hypothyroidism, unspecified: Secondary | ICD-10-CM | POA: Diagnosis not present

## 2018-08-20 DIAGNOSIS — I214 Non-ST elevation (NSTEMI) myocardial infarction: Secondary | ICD-10-CM | POA: Diagnosis not present

## 2018-08-20 DIAGNOSIS — M810 Age-related osteoporosis without current pathological fracture: Secondary | ICD-10-CM | POA: Diagnosis not present

## 2018-08-20 DIAGNOSIS — N390 Urinary tract infection, site not specified: Secondary | ICD-10-CM | POA: Diagnosis not present

## 2018-08-20 DIAGNOSIS — D509 Iron deficiency anemia, unspecified: Secondary | ICD-10-CM | POA: Diagnosis not present

## 2018-08-20 DIAGNOSIS — Z9181 History of falling: Secondary | ICD-10-CM | POA: Diagnosis not present

## 2018-08-20 DIAGNOSIS — I110 Hypertensive heart disease with heart failure: Secondary | ICD-10-CM | POA: Diagnosis not present

## 2018-08-20 DIAGNOSIS — I5032 Chronic diastolic (congestive) heart failure: Secondary | ICD-10-CM | POA: Diagnosis not present

## 2018-08-20 DIAGNOSIS — Z792 Long term (current) use of antibiotics: Secondary | ICD-10-CM | POA: Diagnosis not present

## 2018-08-20 NOTE — Patient Outreach (Signed)
[facility]) Care Management  08/20/2018  Shresta Risden 1950/12/29 [id number]   Patient case has been discussed with a multidisciplinary case conference.    [name], RN, [facility] Care Management,Care Management Coordinator  [phone]- Mobile [phone]- Toll Free Main Office

## 2018-08-21 ENCOUNTER — Other Ambulatory Visit: Payer: Self-pay | Admitting: *Deleted

## 2018-08-21 ENCOUNTER — Ambulatory Visit: Payer: Self-pay | Admitting: *Deleted

## 2018-08-21 DIAGNOSIS — I110 Hypertensive heart disease with heart failure: Secondary | ICD-10-CM | POA: Diagnosis not present

## 2018-08-21 DIAGNOSIS — I214 Non-ST elevation (NSTEMI) myocardial infarction: Secondary | ICD-10-CM | POA: Diagnosis not present

## 2018-08-21 DIAGNOSIS — I480 Paroxysmal atrial fibrillation: Secondary | ICD-10-CM | POA: Diagnosis not present

## 2018-08-21 DIAGNOSIS — D509 Iron deficiency anemia, unspecified: Secondary | ICD-10-CM | POA: Diagnosis not present

## 2018-08-21 DIAGNOSIS — M0689 Other specified rheumatoid arthritis, multiple sites: Secondary | ICD-10-CM | POA: Diagnosis not present

## 2018-08-21 DIAGNOSIS — M810 Age-related osteoporosis without current pathological fracture: Secondary | ICD-10-CM | POA: Diagnosis not present

## 2018-08-21 DIAGNOSIS — I5032 Chronic diastolic (congestive) heart failure: Secondary | ICD-10-CM | POA: Diagnosis not present

## 2018-08-21 DIAGNOSIS — E039 Hypothyroidism, unspecified: Secondary | ICD-10-CM | POA: Diagnosis not present

## 2018-08-21 DIAGNOSIS — Z792 Long term (current) use of antibiotics: Secondary | ICD-10-CM | POA: Diagnosis not present

## 2018-08-21 DIAGNOSIS — N390 Urinary tract infection, site not specified: Secondary | ICD-10-CM | POA: Diagnosis not present

## 2018-08-21 DIAGNOSIS — Z9181 History of falling: Secondary | ICD-10-CM | POA: Diagnosis not present

## 2018-08-21 NOTE — Patient Outreach (Signed)
[facility]) Care Management  08/21/2018  Sakai Heinle January 26, 1951 [id number]   Phone call to patient to discuss referral to [facility] in [place]. Patient agrees with this referral stating that this agency is near her home.    Plan: This Child psychotherapist will submit referral to [facility].   Adriana Reams [facility] Care Management [phone]

## 2018-08-21 NOTE — Patient Outreach (Signed)
[facility]) Care Management  08/21/2018  Katherine Baird [birth year] [id number]   Phone call to patient to follow up on referral to mental health resources. Patient was not at her mother's home but is expected to return after 2pm.  Plan: This social worker will call patient back after 2pm today.    Adriana Reams [facility] Care Management [phone]

## 2018-08-22 DIAGNOSIS — I5032 Chronic diastolic (congestive) heart failure: Secondary | ICD-10-CM | POA: Diagnosis not present

## 2018-08-22 DIAGNOSIS — I214 Non-ST elevation (NSTEMI) myocardial infarction: Secondary | ICD-10-CM | POA: Diagnosis not present

## 2018-08-22 DIAGNOSIS — M0689 Other specified rheumatoid arthritis, multiple sites: Secondary | ICD-10-CM | POA: Diagnosis not present

## 2018-08-22 DIAGNOSIS — N390 Urinary tract infection, site not specified: Secondary | ICD-10-CM | POA: Diagnosis not present

## 2018-08-22 DIAGNOSIS — E039 Hypothyroidism, unspecified: Secondary | ICD-10-CM | POA: Diagnosis not present

## 2018-08-22 DIAGNOSIS — D509 Iron deficiency anemia, unspecified: Secondary | ICD-10-CM | POA: Diagnosis not present

## 2018-08-22 DIAGNOSIS — Z792 Long term (current) use of antibiotics: Secondary | ICD-10-CM | POA: Diagnosis not present

## 2018-08-22 DIAGNOSIS — I480 Paroxysmal atrial fibrillation: Secondary | ICD-10-CM | POA: Diagnosis not present

## 2018-08-22 DIAGNOSIS — Z9181 History of falling: Secondary | ICD-10-CM | POA: Diagnosis not present

## 2018-08-22 DIAGNOSIS — M810 Age-related osteoporosis without current pathological fracture: Secondary | ICD-10-CM | POA: Diagnosis not present

## 2018-08-22 DIAGNOSIS — I110 Hypertensive heart disease with heart failure: Secondary | ICD-10-CM | POA: Diagnosis not present

## 2018-08-25 ENCOUNTER — Other Ambulatory Visit: Payer: Self-pay | Admitting: *Deleted

## 2018-08-25 DIAGNOSIS — I5032 Chronic diastolic (congestive) heart failure: Secondary | ICD-10-CM | POA: Diagnosis not present

## 2018-08-25 DIAGNOSIS — N390 Urinary tract infection, site not specified: Secondary | ICD-10-CM | POA: Diagnosis not present

## 2018-08-25 DIAGNOSIS — D509 Iron deficiency anemia, unspecified: Secondary | ICD-10-CM | POA: Diagnosis not present

## 2018-08-25 DIAGNOSIS — I214 Non-ST elevation (NSTEMI) myocardial infarction: Secondary | ICD-10-CM | POA: Diagnosis not present

## 2018-08-25 DIAGNOSIS — I480 Paroxysmal atrial fibrillation: Secondary | ICD-10-CM | POA: Diagnosis not present

## 2018-08-25 DIAGNOSIS — Z792 Long term (current) use of antibiotics: Secondary | ICD-10-CM | POA: Diagnosis not present

## 2018-08-25 DIAGNOSIS — M810 Age-related osteoporosis without current pathological fracture: Secondary | ICD-10-CM | POA: Diagnosis not present

## 2018-08-25 DIAGNOSIS — I110 Hypertensive heart disease with heart failure: Secondary | ICD-10-CM | POA: Diagnosis not present

## 2018-08-25 DIAGNOSIS — M0689 Other specified rheumatoid arthritis, multiple sites: Secondary | ICD-10-CM | POA: Diagnosis not present

## 2018-08-25 DIAGNOSIS — E039 Hypothyroidism, unspecified: Secondary | ICD-10-CM | POA: Diagnosis not present

## 2018-08-25 DIAGNOSIS — Z9181 History of falling: Secondary | ICD-10-CM | POA: Diagnosis not present

## 2018-08-25 NOTE — Patient Outreach (Addendum)
[facility]) Care Management   08/25/2018  [name] 1951/02/01 [id number]  Katherine Baird is an 67 y.o. female  Subjective: Patient discussed finding out what the problem was with her back, she states that she has a problem with the disk in her back, she discussed having a new appointment with [name].   Patient discussed being happy to be back at her home during the day, family will transport her back later to her moms home where she still stays at night and family will transport patient back to her home each morning.  Patient discussed that she is feeling better than she has been over the last few weeks, she reports some improvement in back pain .  Objective:  BP 130/80 (BP Location: Right Arm, Patient Position: Sitting, Cuff Size: Normal)   Pulse 77   Resp 18   SpO2 98%  Review of Systems  Constitutional: Negative.   HENT: Negative.   Eyes: Negative.   Respiratory: Negative.   Cardiovascular: Negative.   Gastrointestinal: Negative.   Genitourinary: Negative.   Musculoskeletal: Positive for back pain.  Skin: Negative.     Physical Exam  Constitutional: She is oriented to person, place, and time. She appears well-developed and well-nourished. She appears cachectic.  Cardiovascular: Normal rate and normal heart sounds.  Respiratory: Effort normal and breath sounds normal.  GI: Soft.  Neurological: She is alert and oriented to person, place, and time.  Skin: Skin is warm and dry.    Encounter Medications:   Outpatient Encounter Medications as of 08/25/2018  Medication Sig  . acetaminophen (TYLENOL) 500 MG tablet Take 500 mg by mouth every 8 (eight) hours as needed for moderate pain.  Marland Kitchen aspirin EC 81 MG tablet Take 81 mg by mouth daily.  . carvedilol (COREG) 6.25 MG tablet Take 6.25 mg by mouth 2 (two) times daily with a meal.   . clobetasol ointment (TEMOVATE) 0.05 % Apply 1 application topically 2 (two) times daily as needed (for irritation).    Marland Kitchen denosumab (PROLIA) 60 MG/ML SOSY injection Inject 60 mg into the skin every 6 (six) months.  . docusate sodium (COLACE) 100 MG capsule Take 100 mg by mouth daily as needed for mild constipation. Taking one to two a day  . folic acid (FOLVITE) 1 MG tablet Take 1 mg by mouth daily.  Marland Kitchen levothyroxine (SYNTHROID, LEVOTHROID) 50 MCG tablet Take 50 mcg by mouth daily before breakfast.   . mirtazapine (REMERON) 15 MG tablet Take 15 mg by mouth at bedtime.  . polyethylene glycol (MIRALAX / GLYCOLAX) packet Take 17 g by mouth daily as needed.   . traMADol (ULTRAM) 50 MG tablet Take 1 tablet (50 mg total) by mouth every 6 (six) hours as needed for moderate pain or severe pain. (Patient taking differently: Take 50 mg by mouth every 6 (six) hours as needed for moderate pain or severe pain. )   No facility-administered encounter medications on file as of 08/25/2018.     Functional Status:   In your present state of health, do you have any difficulty performing the following activities: 07/15/2018 07/02/2018  Hearing? N -  Vision? N -  Difficulty concentrating or making decisions? Y -  Walking or climbing stairs? N -  Comment chronic back pain  -  Dressing or bathing? Y -  Doing errands, shopping? Y N  Preparing Food and eating ? Y -  Comment family assist  -  Using the Toilet? N -  In the  past six months, have you accidently leaked urine? N -  Do you have problems with loss of bowel control? N -  Managing your Medications? Y -  Managing your Finances? N -  Housekeeping or managing your Housekeeping? Y -  Comment family assisting -  Some recent data might be hidden    Fall/Depression Screening:    Fall Risk  07/15/2018 07/10/2018 06/30/2018  Falls in the past year? 1 0 1  Number falls in past yr: 0 - 0  Injury with Fall? 1 - 1  Risk for fall due to : History of fall(s);Impaired balance/gait;Impaired mobility - History of fall(s);Impaired balance/gait;Medication side effect  Follow up Falls  prevention discussed - Falls evaluation completed   PHQ 2/9 Scores 07/15/2018 04/06/2018 01/13/2018 09/19/2017 05/24/2016  PHQ - 2 Score 2 0 0 0 0  PHQ- 9 Score 9 - - - -    Assessment:  Routine home visit  Back Pain Some improvement in discomfort, tolerating mobility in the home .  Patient has appointment with physiatry in the next 2 weeks.  Patient now has a medical alert system with GPS. Weight loss Patient discussed trying to increase protein in diet, states having to make her self eat. Reports only eating peanut butter and part of protein. Patient will continue to benefit from education and support on nutrition . Patient unable to recall her recent home weight. Patient reports not hearing from nutrition center for referral   Constipation  Reports improvement and having bowel movements at least every 2 days and if she doesn't she takes miralax. Continued education and support .  Recent Takotsubo MI  Denies chest pain  Mental Health  [facility] LSCW following for referral to behavioral care .  Transportation concern Assisted patient with contacting [employer] customer services regarding transportation benefit. Patient has written contact number down, and understands how many rides she has.   Plan Will plan follow up call in the next 2 weeks.  Place call to [name] office, left message with Chassidy CMA to follow up on referral to nutrition. Brandon Melnick CM Care Plan Problem One     Most Recent Value  Care Plan Problem One  High risk for readmission related to readmission to  hospital for abdominal pain, constipation,FTT, in additon to recent admission for Takotsubo cardiomyopathy    Role Documenting the Problem One  Care Management Coordinator  Care Plan for Problem One  Active  [facility] Long Term Goal   Patient will not experience a hospital readmission over the next 31 days  [facility] Long Term Goal Start Date  08/06/18 Staci Righter restarted related to admission ]  Interventions for Problem One  Long Term Goal  Home visit completed.   [facility] CM Short Term Goal #1   Patient will report attending all medical appointments over the next 30 days   [facility] CM Short Term Goal #1 Start Date  08/06/18  Interventions for Short Term Goal #1  Reviewed upcoming appointments with PCP , reviewed importance fo attending all visit , assisting with contacting [facility] transportation benefit.   [facility] CM Short Term Goal #2   Patient will be able to report participation in daily physical therapy exercises over the next 30 days   [facility] CM Short Term Goal #2 Start Date  08/06/18  Interventions for Short Term Goal #2  Encouraged continued participation in home exercise plan provided by therapy   [facility] CM Short Term Goal #3  Over the next 30 days patient will  be able to report increased protein nutriton intake   [facility] CM Short Term Goal #3 Start Date  08/06/18  Interventions for Short Tern Goal #3  Care coordination call to PCP office to follow up on nutrition referral   [facility] CM Short Term Goal #4  Over the next 30 days patient will be able to verbalize 3 measure to prevent constipation.   [facility] CM Short Term Goal #4 Start Date  08/14/18  Interventions for Short Term Goal #4  Again reviewed with patient importance of regular bowel movements taking medications as prescribed, reviewed how vegetables/fruit help with relieving constiipation, reviewed EMMI on constipation with teach back,     [facility] CM Care Plan Problem Two     Most Recent Value  Care Plan Problem Two  Chronic back pain related to arthritis   Role Documenting the Problem Two  Care Management Coordinator  Care Plan for Problem Two  Active  [facility] CM Short Term Goal #1   Patient will be able to verbalize improvemnt of back pain over the next 30 days   [facility] CM Short Term Goal #1 Start Date  08/06/18 Staci Righter restart ]  Interventions for Short Term Goal #2   Reviewed patient current pain status and improvement reinforced attending new referral appointnent with PMR      [name], RN, [facility] Care Management,Care Management Coordinator  [phone]- Mobile [phone]- Toll Free Main Office

## 2018-08-26 DIAGNOSIS — I110 Hypertensive heart disease with heart failure: Secondary | ICD-10-CM | POA: Diagnosis not present

## 2018-08-26 DIAGNOSIS — I480 Paroxysmal atrial fibrillation: Secondary | ICD-10-CM | POA: Diagnosis not present

## 2018-08-26 DIAGNOSIS — Z792 Long term (current) use of antibiotics: Secondary | ICD-10-CM | POA: Diagnosis not present

## 2018-08-26 DIAGNOSIS — E039 Hypothyroidism, unspecified: Secondary | ICD-10-CM | POA: Diagnosis not present

## 2018-08-26 DIAGNOSIS — M810 Age-related osteoporosis without current pathological fracture: Secondary | ICD-10-CM | POA: Diagnosis not present

## 2018-08-26 DIAGNOSIS — I214 Non-ST elevation (NSTEMI) myocardial infarction: Secondary | ICD-10-CM | POA: Diagnosis not present

## 2018-08-26 DIAGNOSIS — I5032 Chronic diastolic (congestive) heart failure: Secondary | ICD-10-CM | POA: Diagnosis not present

## 2018-08-26 DIAGNOSIS — Z9181 History of falling: Secondary | ICD-10-CM | POA: Diagnosis not present

## 2018-08-26 DIAGNOSIS — N390 Urinary tract infection, site not specified: Secondary | ICD-10-CM | POA: Diagnosis not present

## 2018-08-26 DIAGNOSIS — D509 Iron deficiency anemia, unspecified: Secondary | ICD-10-CM | POA: Diagnosis not present

## 2018-08-26 DIAGNOSIS — M0689 Other specified rheumatoid arthritis, multiple sites: Secondary | ICD-10-CM | POA: Diagnosis not present

## 2018-08-28 ENCOUNTER — Other Ambulatory Visit (INDEPENDENT_AMBULATORY_CARE_PROVIDER_SITE_OTHER): Payer: Medicare Other | Admitting: Internal Medicine

## 2018-08-28 DIAGNOSIS — I5032 Chronic diastolic (congestive) heart failure: Secondary | ICD-10-CM | POA: Diagnosis not present

## 2018-08-28 DIAGNOSIS — K5901 Slow transit constipation: Secondary | ICD-10-CM

## 2018-08-28 DIAGNOSIS — M359 Systemic involvement of connective tissue, unspecified: Secondary | ICD-10-CM

## 2018-08-28 DIAGNOSIS — I480 Paroxysmal atrial fibrillation: Secondary | ICD-10-CM

## 2018-08-28 DIAGNOSIS — Z9181 History of falling: Secondary | ICD-10-CM | POA: Diagnosis not present

## 2018-08-28 DIAGNOSIS — N390 Urinary tract infection, site not specified: Secondary | ICD-10-CM | POA: Diagnosis not present

## 2018-08-28 DIAGNOSIS — M810 Age-related osteoporosis without current pathological fracture: Secondary | ICD-10-CM | POA: Diagnosis not present

## 2018-08-28 DIAGNOSIS — I214 Non-ST elevation (NSTEMI) myocardial infarction: Secondary | ICD-10-CM

## 2018-08-28 DIAGNOSIS — E039 Hypothyroidism, unspecified: Secondary | ICD-10-CM | POA: Diagnosis not present

## 2018-08-28 DIAGNOSIS — M069 Rheumatoid arthritis, unspecified: Secondary | ICD-10-CM

## 2018-08-28 DIAGNOSIS — E034 Atrophy of thyroid (acquired): Secondary | ICD-10-CM

## 2018-08-28 DIAGNOSIS — I7300 Raynaud's syndrome without gangrene: Secondary | ICD-10-CM

## 2018-08-28 DIAGNOSIS — D509 Iron deficiency anemia, unspecified: Secondary | ICD-10-CM | POA: Diagnosis not present

## 2018-08-28 DIAGNOSIS — I110 Hypertensive heart disease with heart failure: Secondary | ICD-10-CM | POA: Diagnosis not present

## 2018-08-28 DIAGNOSIS — M0689 Other specified rheumatoid arthritis, multiple sites: Secondary | ICD-10-CM | POA: Diagnosis not present

## 2018-08-28 DIAGNOSIS — Z792 Long term (current) use of antibiotics: Secondary | ICD-10-CM | POA: Diagnosis not present

## 2018-08-28 DIAGNOSIS — I10 Essential (primary) hypertension: Secondary | ICD-10-CM

## 2018-08-28 NOTE — Progress Notes (Signed)
Received orders from [facility]. Start of care 07/07/18 through 09/04/18. Orders are reviewed, signed and faxed.

## 2018-08-31 DIAGNOSIS — Z9181 History of falling: Secondary | ICD-10-CM | POA: Diagnosis not present

## 2018-08-31 DIAGNOSIS — M810 Age-related osteoporosis without current pathological fracture: Secondary | ICD-10-CM | POA: Diagnosis not present

## 2018-08-31 DIAGNOSIS — I480 Paroxysmal atrial fibrillation: Secondary | ICD-10-CM | POA: Diagnosis not present

## 2018-08-31 DIAGNOSIS — M0689 Other specified rheumatoid arthritis, multiple sites: Secondary | ICD-10-CM | POA: Diagnosis not present

## 2018-08-31 DIAGNOSIS — I214 Non-ST elevation (NSTEMI) myocardial infarction: Secondary | ICD-10-CM | POA: Diagnosis not present

## 2018-08-31 DIAGNOSIS — Z792 Long term (current) use of antibiotics: Secondary | ICD-10-CM | POA: Diagnosis not present

## 2018-08-31 DIAGNOSIS — D509 Iron deficiency anemia, unspecified: Secondary | ICD-10-CM | POA: Diagnosis not present

## 2018-08-31 DIAGNOSIS — N390 Urinary tract infection, site not specified: Secondary | ICD-10-CM | POA: Diagnosis not present

## 2018-08-31 DIAGNOSIS — I110 Hypertensive heart disease with heart failure: Secondary | ICD-10-CM | POA: Diagnosis not present

## 2018-08-31 DIAGNOSIS — E039 Hypothyroidism, unspecified: Secondary | ICD-10-CM | POA: Diagnosis not present

## 2018-08-31 DIAGNOSIS — I5032 Chronic diastolic (congestive) heart failure: Secondary | ICD-10-CM | POA: Diagnosis not present

## 2018-09-01 DIAGNOSIS — K5909 Other constipation: Secondary | ICD-10-CM | POA: Diagnosis not present

## 2018-09-01 DIAGNOSIS — I252 Old myocardial infarction: Secondary | ICD-10-CM | POA: Diagnosis not present

## 2018-09-01 DIAGNOSIS — E039 Hypothyroidism, unspecified: Secondary | ICD-10-CM | POA: Diagnosis not present

## 2018-09-01 DIAGNOSIS — M1990 Unspecified osteoarthritis, unspecified site: Secondary | ICD-10-CM | POA: Diagnosis not present

## 2018-09-01 DIAGNOSIS — I480 Paroxysmal atrial fibrillation: Secondary | ICD-10-CM | POA: Diagnosis not present

## 2018-09-01 DIAGNOSIS — I5042 Chronic combined systolic (congestive) and diastolic (congestive) heart failure: Secondary | ICD-10-CM | POA: Diagnosis not present

## 2018-09-01 DIAGNOSIS — Z87440 Personal history of urinary (tract) infections: Secondary | ICD-10-CM | POA: Diagnosis not present

## 2018-09-01 DIAGNOSIS — Z9181 History of falling: Secondary | ICD-10-CM | POA: Diagnosis not present

## 2018-09-01 DIAGNOSIS — M0689 Other specified rheumatoid arthritis, multiple sites: Secondary | ICD-10-CM | POA: Diagnosis not present

## 2018-09-01 DIAGNOSIS — I7300 Raynaud's syndrome without gangrene: Secondary | ICD-10-CM | POA: Diagnosis not present

## 2018-09-01 DIAGNOSIS — I959 Hypotension, unspecified: Secondary | ICD-10-CM | POA: Diagnosis not present

## 2018-09-01 DIAGNOSIS — I110 Hypertensive heart disease with heart failure: Secondary | ICD-10-CM | POA: Diagnosis not present

## 2018-09-01 DIAGNOSIS — M8008XD Age-related osteoporosis with current pathological fracture, vertebra(e), subsequent encounter for fracture with routine healing: Secondary | ICD-10-CM | POA: Diagnosis not present

## 2018-09-01 DIAGNOSIS — D509 Iron deficiency anemia, unspecified: Secondary | ICD-10-CM | POA: Diagnosis not present

## 2018-09-02 DIAGNOSIS — I480 Paroxysmal atrial fibrillation: Secondary | ICD-10-CM | POA: Diagnosis not present

## 2018-09-02 DIAGNOSIS — M8008XD Age-related osteoporosis with current pathological fracture, vertebra(e), subsequent encounter for fracture with routine healing: Secondary | ICD-10-CM | POA: Diagnosis not present

## 2018-09-02 DIAGNOSIS — Z9181 History of falling: Secondary | ICD-10-CM | POA: Diagnosis not present

## 2018-09-02 DIAGNOSIS — Z87440 Personal history of urinary (tract) infections: Secondary | ICD-10-CM | POA: Diagnosis not present

## 2018-09-02 DIAGNOSIS — M1990 Unspecified osteoarthritis, unspecified site: Secondary | ICD-10-CM | POA: Diagnosis not present

## 2018-09-02 DIAGNOSIS — D509 Iron deficiency anemia, unspecified: Secondary | ICD-10-CM | POA: Diagnosis not present

## 2018-09-02 DIAGNOSIS — I7300 Raynaud's syndrome without gangrene: Secondary | ICD-10-CM | POA: Diagnosis not present

## 2018-09-02 DIAGNOSIS — I252 Old myocardial infarction: Secondary | ICD-10-CM | POA: Diagnosis not present

## 2018-09-02 DIAGNOSIS — K5909 Other constipation: Secondary | ICD-10-CM | POA: Diagnosis not present

## 2018-09-02 DIAGNOSIS — I959 Hypotension, unspecified: Secondary | ICD-10-CM | POA: Diagnosis not present

## 2018-09-02 DIAGNOSIS — M0689 Other specified rheumatoid arthritis, multiple sites: Secondary | ICD-10-CM | POA: Diagnosis not present

## 2018-09-02 DIAGNOSIS — I110 Hypertensive heart disease with heart failure: Secondary | ICD-10-CM | POA: Diagnosis not present

## 2018-09-02 DIAGNOSIS — I5042 Chronic combined systolic (congestive) and diastolic (congestive) heart failure: Secondary | ICD-10-CM | POA: Diagnosis not present

## 2018-09-02 DIAGNOSIS — E039 Hypothyroidism, unspecified: Secondary | ICD-10-CM | POA: Diagnosis not present

## 2018-09-03 ENCOUNTER — Other Ambulatory Visit: Payer: Self-pay | Admitting: *Deleted

## 2018-09-03 ENCOUNTER — Encounter: Payer: Medicare Other | Attending: Internal Medicine | Admitting: Dietician

## 2018-09-03 ENCOUNTER — Encounter: Payer: Self-pay | Admitting: Dietician

## 2018-09-03 VITALS — Ht 60.0 in | Wt 86.1 lb

## 2018-09-03 DIAGNOSIS — E440 Moderate protein-calorie malnutrition: Secondary | ICD-10-CM | POA: Diagnosis not present

## 2018-09-03 NOTE — Progress Notes (Signed)
Medical Nutrition Therapy: Visit start time: 1345  end time: 1445  Assessment:  Diagnosis: protein-calorie malnutrition Past medical history: arthritis, hypothyroidism Psychosocial issues/ stress concerns: none  Preferred learning method:  . Auditory . Visual . 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 Feel appetite has improved slightly but has taste changes food tastes too sweet or bitter or just "different". She feels full quickly when eating. Sister reports that patient often forgets to eat when home alone. Patient reports intolerance to milk, eggs (upset stomach). Patient is not sleeping well due to pain during the night.  Physical activity: little activity at this time due to pain  Dietary Intake:  Usual eating pattern: grazing, tries to eat every 1-2 hours. Dining out frequency: 0 meals per week.  Typical food choices include: spoonful of natural peanut butter; protein bar; fruit ie fruit cocktail; special K cereal with nuts and fruit Has limited low-carb vegetables to avoid fullness on low-cal food, although she loves most vegetables. Loves honey, does eat spoons of plain honey Beverages: water, tea with sugar.   Nutrition Care Education: Topics covered: weight gain, basic nutrition Basic nutrition: appropriate meal and snack schedule, importance of adequate protein and healthy protein sources; encouraged multivitamin when unable to eat regular balanced meals    Weight gain: ways to stimulate appetite; adding calories and protein to foods; making foods convenient; options for meals that do not require cooking/ energy to prepare; role of stress, pain, sleep on appetite and hunger.     Nutritional Diagnosis:  [place]-3.2 Unintentional weight loss As related to poor appetite and food intake.  As evidenced by patient with current BMI of 16.8, and patient report of diet history. NI-5.11.1 Predicted suboptimal nutrient intake As related to poor appetite and food intake.  As evidenced by patient with current BMI of 16.8 and patient report of diet history.  Intervention:   Instruction as noted above.  Patient has been working to increase protein intake and is willing to try most options that do not require cooking (back pain increases when she tries to stand and cook)  Encouraged patient to take a multivitamin.  Set goals with input from patient.   Education Materials given:  Marland Kitchen How to Gain Weight in a Healthy Way . High protein smoothie recipes . Goals/ instructions   Learner/ who was taught:  . Patient  . Family member: sister    Level of understanding: Marland Kitchen Verbalizes/ demonstrates competency   Demonstrated degree of understanding via:   Teach back Learning barriers: . None  Willingness to learn/ readiness for change: . Eager, change in progress  Monitoring and Evaluation:  Dietary intake, exercise, and body weight      follow up: 10/08/18

## 2018-09-03 NOTE — Patient Outreach (Signed)
[facility]) Care Management  09/03/2018  Katherine Baird 06/02/1951 [id number]   Phone call to patient to inform her of referral made to [facility] for medication management and therapy. Patient was not in, however the person that answered the phone stated that she was at a nutritionist appointment scheduled for 1 pm and would give patient the message to return this social worker's call.   Plan: Message left for a return call.   Adriana Reams [facility] Care Management [phone]

## 2018-09-03 NOTE — Patient Instructions (Signed)
   Plan to eat something every 2-3 hours during the day.   Keep snacks convenient -- out on the counter, side table, bedside to be able to eat them often.   Put a sticky note at the phone or set an alarm as a reminder to eat.   Try some microwaveable frozen dinners for easy meals.   Try making a smoothie with some fruit, protein powder, nuts and/or seeds.   Snack mixes, crackers with peanut butter, chicken salad, graham crackers with peanut butter, pudding cups, are good options to keep on hand.   Keep a diary of what and how much you eat each day.

## 2018-09-04 DIAGNOSIS — I5042 Chronic combined systolic (congestive) and diastolic (congestive) heart failure: Secondary | ICD-10-CM | POA: Diagnosis not present

## 2018-09-04 DIAGNOSIS — Z9181 History of falling: Secondary | ICD-10-CM | POA: Diagnosis not present

## 2018-09-04 DIAGNOSIS — I252 Old myocardial infarction: Secondary | ICD-10-CM | POA: Diagnosis not present

## 2018-09-04 DIAGNOSIS — I480 Paroxysmal atrial fibrillation: Secondary | ICD-10-CM | POA: Diagnosis not present

## 2018-09-04 DIAGNOSIS — Z87440 Personal history of urinary (tract) infections: Secondary | ICD-10-CM | POA: Diagnosis not present

## 2018-09-04 DIAGNOSIS — I110 Hypertensive heart disease with heart failure: Secondary | ICD-10-CM | POA: Diagnosis not present

## 2018-09-04 DIAGNOSIS — D509 Iron deficiency anemia, unspecified: Secondary | ICD-10-CM | POA: Diagnosis not present

## 2018-09-04 DIAGNOSIS — I959 Hypotension, unspecified: Secondary | ICD-10-CM | POA: Diagnosis not present

## 2018-09-04 DIAGNOSIS — K5909 Other constipation: Secondary | ICD-10-CM | POA: Diagnosis not present

## 2018-09-04 DIAGNOSIS — M0689 Other specified rheumatoid arthritis, multiple sites: Secondary | ICD-10-CM | POA: Diagnosis not present

## 2018-09-04 DIAGNOSIS — M1990 Unspecified osteoarthritis, unspecified site: Secondary | ICD-10-CM | POA: Diagnosis not present

## 2018-09-04 DIAGNOSIS — M8008XD Age-related osteoporosis with current pathological fracture, vertebra(e), subsequent encounter for fracture with routine healing: Secondary | ICD-10-CM | POA: Diagnosis not present

## 2018-09-04 DIAGNOSIS — E039 Hypothyroidism, unspecified: Secondary | ICD-10-CM | POA: Diagnosis not present

## 2018-09-04 DIAGNOSIS — I7300 Raynaud's syndrome without gangrene: Secondary | ICD-10-CM | POA: Diagnosis not present

## 2018-09-04 NOTE — Patient Outreach (Signed)
[facility]) Care Management  09/04/2018  Katherine Baird [facility] April 20, 1951 [id number]   Late Entry-Return phone call from patient. This Child psychotherapist discussed referral to outpatient mental health provider, specifically [facility]. Patient agrees to referral.She understands that [facility] will contact her to schedule the appointment. Patient further confirms that she did see the nutritionist today.    Plan: This Child psychotherapist will follow up with patient within 2 weeks regarding appointment with outpatient mental health.   Adriana Reams [facility] Care Management [phone]

## 2018-09-07 DIAGNOSIS — Z87440 Personal history of urinary (tract) infections: Secondary | ICD-10-CM | POA: Diagnosis not present

## 2018-09-07 DIAGNOSIS — E039 Hypothyroidism, unspecified: Secondary | ICD-10-CM | POA: Diagnosis not present

## 2018-09-07 DIAGNOSIS — Z9181 History of falling: Secondary | ICD-10-CM | POA: Diagnosis not present

## 2018-09-07 DIAGNOSIS — I110 Hypertensive heart disease with heart failure: Secondary | ICD-10-CM | POA: Diagnosis not present

## 2018-09-07 DIAGNOSIS — D509 Iron deficiency anemia, unspecified: Secondary | ICD-10-CM | POA: Diagnosis not present

## 2018-09-07 DIAGNOSIS — I959 Hypotension, unspecified: Secondary | ICD-10-CM | POA: Diagnosis not present

## 2018-09-07 DIAGNOSIS — M8008XD Age-related osteoporosis with current pathological fracture, vertebra(e), subsequent encounter for fracture with routine healing: Secondary | ICD-10-CM | POA: Diagnosis not present

## 2018-09-07 DIAGNOSIS — M1990 Unspecified osteoarthritis, unspecified site: Secondary | ICD-10-CM | POA: Diagnosis not present

## 2018-09-07 DIAGNOSIS — M0689 Other specified rheumatoid arthritis, multiple sites: Secondary | ICD-10-CM | POA: Diagnosis not present

## 2018-09-07 DIAGNOSIS — I5042 Chronic combined systolic (congestive) and diastolic (congestive) heart failure: Secondary | ICD-10-CM | POA: Diagnosis not present

## 2018-09-07 DIAGNOSIS — I480 Paroxysmal atrial fibrillation: Secondary | ICD-10-CM | POA: Diagnosis not present

## 2018-09-07 DIAGNOSIS — I252 Old myocardial infarction: Secondary | ICD-10-CM | POA: Diagnosis not present

## 2018-09-07 DIAGNOSIS — I7300 Raynaud's syndrome without gangrene: Secondary | ICD-10-CM | POA: Diagnosis not present

## 2018-09-07 DIAGNOSIS — K5909 Other constipation: Secondary | ICD-10-CM | POA: Diagnosis not present

## 2018-09-08 DIAGNOSIS — M5442 Lumbago with sciatica, left side: Secondary | ICD-10-CM | POA: Diagnosis not present

## 2018-09-08 DIAGNOSIS — M5126 Other intervertebral disc displacement, lumbar region: Secondary | ICD-10-CM | POA: Diagnosis not present

## 2018-09-08 DIAGNOSIS — M5136 Other intervertebral disc degeneration, lumbar region: Secondary | ICD-10-CM | POA: Diagnosis not present

## 2018-09-08 DIAGNOSIS — M5416 Radiculopathy, lumbar region: Secondary | ICD-10-CM | POA: Diagnosis not present

## 2018-09-08 DIAGNOSIS — M545 Low back pain: Secondary | ICD-10-CM | POA: Diagnosis not present

## 2018-09-09 ENCOUNTER — Other Ambulatory Visit: Payer: Self-pay | Admitting: *Deleted

## 2018-09-09 DIAGNOSIS — M8008XD Age-related osteoporosis with current pathological fracture, vertebra(e), subsequent encounter for fracture with routine healing: Secondary | ICD-10-CM | POA: Diagnosis not present

## 2018-09-09 DIAGNOSIS — I5042 Chronic combined systolic (congestive) and diastolic (congestive) heart failure: Secondary | ICD-10-CM | POA: Diagnosis not present

## 2018-09-09 DIAGNOSIS — I252 Old myocardial infarction: Secondary | ICD-10-CM | POA: Diagnosis not present

## 2018-09-09 DIAGNOSIS — M1990 Unspecified osteoarthritis, unspecified site: Secondary | ICD-10-CM | POA: Diagnosis not present

## 2018-09-09 DIAGNOSIS — M0689 Other specified rheumatoid arthritis, multiple sites: Secondary | ICD-10-CM | POA: Diagnosis not present

## 2018-09-09 DIAGNOSIS — I110 Hypertensive heart disease with heart failure: Secondary | ICD-10-CM | POA: Diagnosis not present

## 2018-09-09 DIAGNOSIS — D509 Iron deficiency anemia, unspecified: Secondary | ICD-10-CM | POA: Diagnosis not present

## 2018-09-09 DIAGNOSIS — Z87440 Personal history of urinary (tract) infections: Secondary | ICD-10-CM | POA: Diagnosis not present

## 2018-09-09 DIAGNOSIS — K5909 Other constipation: Secondary | ICD-10-CM | POA: Diagnosis not present

## 2018-09-09 DIAGNOSIS — Z9181 History of falling: Secondary | ICD-10-CM | POA: Diagnosis not present

## 2018-09-09 DIAGNOSIS — I959 Hypotension, unspecified: Secondary | ICD-10-CM | POA: Diagnosis not present

## 2018-09-09 DIAGNOSIS — I480 Paroxysmal atrial fibrillation: Secondary | ICD-10-CM | POA: Diagnosis not present

## 2018-09-09 DIAGNOSIS — I7300 Raynaud's syndrome without gangrene: Secondary | ICD-10-CM | POA: Diagnosis not present

## 2018-09-09 DIAGNOSIS — E039 Hypothyroidism, unspecified: Secondary | ICD-10-CM | POA: Diagnosis not present

## 2018-09-09 MED ORDER — DUPILUMAB 300 MG/2 ML SUBCUTANEOUS SYRINGE
SUBCUTANEOUS | 12 refills | 0.00000 days | Status: CP
Start: 2018-09-09 — End: ?

## 2018-09-09 NOTE — Patient Outreach (Signed)
[facility]) Care Management  09/09/2018  Katherine Baird 1950/10/06 [id number]   Telephone assessment follow up   Unsuccessful outreach call to patient , no answer able to leave a HIPAA compliant message for return call.   Return call from patient , she discussed not doing as good as yesterday, report yesterday was good day for her as far as pain control. She discussed overall improvement from a few weeks ago as she understands how the pain in her back really affected her eating .  Patient discussed :  Chronic back pain  Discussed visit with [name] office , reports improvement with pain after taking tramadol after visit.  Patient continues to work with home health PT discussed continued benefit   Patient denies chest pain or shortness of breath  She discussed chronic back pain is her main concern.   Nutrition She discussed recent visit to nutritionist, she discussed following some of tips to help increase nutrition and protein in her diet. Will continue to provide education and support of nutrition . Patient discussed weighing and is pleased with no weight loss and her weight being up at least 2 pounds. Reinforced attending follow up visit to nutrition clinic Patient reports that her sister continues to provide support , helping with shopping to provide suggested nutrition recommendations.   Patient discussed having regular bowel movements .   Patient asked about referral to counselor I reminded her  that [facility] LCSW has sent referral to [facility] and they will contact her for an appointment and she will follow up with her .   Appointments upcoming  Dr.Bergllund , PCP  on 1/28 Nutrition clinic on 2/13 [name] 1/29  Plan Will plan follow up call to patient in the next month, for assessment progress toward care goals. Reinforced attending all medical appointments.    [name], RN, [facility] Care Management,Care Management  Coordinator  [phone]- Mobile [phone]- Toll Free Main Office

## 2018-09-10 ENCOUNTER — Other Ambulatory Visit: Payer: Self-pay | Admitting: *Deleted

## 2018-09-10 NOTE — Patient Outreach (Signed)
[facility]) Care Management  09/10/2018  Michelene Barrientos May 09, 1951 [id number]   Phone call to [facility] to confirm that the received the referral for outpatient mental health. It was confirmed that the referral has been received and they will call patient today to schedule the initial appointment.   Plan: This Child psychotherapist will follow up with patient within 1 week to ensure that the appointment has been scheduled.    Adriana Reams [facility] Care Management [phone]

## 2018-09-10 NOTE — Patient Outreach (Signed)
[facility]) Care Management  09/10/2018  Katherine Baird Dec 27, 1950 [id number]    Patient has been dicussed in a multidisciplinary case conference .  Plan  Will plan outreach in the next month.   [name], RN, [facility] Care Management,Care Management Coordinator  [phone]- Mobile [phone]- Toll Free Main Office

## 2018-09-11 ENCOUNTER — Other Ambulatory Visit: Payer: Self-pay | Admitting: *Deleted

## 2018-09-11 NOTE — Patient Outreach (Signed)
[facility]) Care Management  09/11/2018  Katherine Baird 08/06/1951 [id number]   Phone call to patient to follow up on referral to [facility]. Per patient, she did receive a call from them, however she was confused regarding the referral needed. Per patient, they told her it would be for pain management. She  Patient states that she is feeling much better, has gained 6 pounds and has no symptoms of depression or anxiety. Patient states that if a mental health follow up is needed, then she will go.   Plan: This Child psychotherapist will contact [employer] [facility] to clarify referral.   Adriana Reams [facility] Care Management [phone]

## 2018-09-14 DIAGNOSIS — Z9181 History of falling: Secondary | ICD-10-CM | POA: Diagnosis not present

## 2018-09-14 DIAGNOSIS — I5042 Chronic combined systolic (congestive) and diastolic (congestive) heart failure: Secondary | ICD-10-CM | POA: Diagnosis not present

## 2018-09-14 DIAGNOSIS — I480 Paroxysmal atrial fibrillation: Secondary | ICD-10-CM | POA: Diagnosis not present

## 2018-09-14 DIAGNOSIS — K5909 Other constipation: Secondary | ICD-10-CM | POA: Diagnosis not present

## 2018-09-14 DIAGNOSIS — E039 Hypothyroidism, unspecified: Secondary | ICD-10-CM | POA: Diagnosis not present

## 2018-09-14 DIAGNOSIS — M1990 Unspecified osteoarthritis, unspecified site: Secondary | ICD-10-CM | POA: Diagnosis not present

## 2018-09-14 DIAGNOSIS — I7300 Raynaud's syndrome without gangrene: Secondary | ICD-10-CM | POA: Diagnosis not present

## 2018-09-14 DIAGNOSIS — M8008XD Age-related osteoporosis with current pathological fracture, vertebra(e), subsequent encounter for fracture with routine healing: Secondary | ICD-10-CM | POA: Diagnosis not present

## 2018-09-14 DIAGNOSIS — M0689 Other specified rheumatoid arthritis, multiple sites: Secondary | ICD-10-CM | POA: Diagnosis not present

## 2018-09-14 DIAGNOSIS — Z87440 Personal history of urinary (tract) infections: Secondary | ICD-10-CM | POA: Diagnosis not present

## 2018-09-14 DIAGNOSIS — I252 Old myocardial infarction: Secondary | ICD-10-CM | POA: Diagnosis not present

## 2018-09-14 DIAGNOSIS — I110 Hypertensive heart disease with heart failure: Secondary | ICD-10-CM | POA: Diagnosis not present

## 2018-09-14 DIAGNOSIS — D509 Iron deficiency anemia, unspecified: Secondary | ICD-10-CM | POA: Diagnosis not present

## 2018-09-14 DIAGNOSIS — I959 Hypotension, unspecified: Secondary | ICD-10-CM | POA: Diagnosis not present

## 2018-09-15 ENCOUNTER — Other Ambulatory Visit: Payer: Self-pay | Admitting: *Deleted

## 2018-09-15 NOTE — Patient Outreach (Signed)
[facility]) Care Management  09/15/2018  Katherine Baird Apr 08, 1951 [id number]   Phone call to [facility] to discuss referral previously faxed for medication management. Voicemail message left for a return call.   Adriana Reams [facility] Care Management [phone]

## 2018-09-16 ENCOUNTER — Other Ambulatory Visit: Payer: Self-pay | Admitting: *Deleted

## 2018-09-16 DIAGNOSIS — I252 Old myocardial infarction: Secondary | ICD-10-CM | POA: Diagnosis not present

## 2018-09-16 DIAGNOSIS — I480 Paroxysmal atrial fibrillation: Secondary | ICD-10-CM | POA: Diagnosis not present

## 2018-09-16 DIAGNOSIS — M1990 Unspecified osteoarthritis, unspecified site: Secondary | ICD-10-CM | POA: Diagnosis not present

## 2018-09-16 DIAGNOSIS — I959 Hypotension, unspecified: Secondary | ICD-10-CM | POA: Diagnosis not present

## 2018-09-16 DIAGNOSIS — I5042 Chronic combined systolic (congestive) and diastolic (congestive) heart failure: Secondary | ICD-10-CM | POA: Diagnosis not present

## 2018-09-16 DIAGNOSIS — I110 Hypertensive heart disease with heart failure: Secondary | ICD-10-CM | POA: Diagnosis not present

## 2018-09-16 DIAGNOSIS — M0689 Other specified rheumatoid arthritis, multiple sites: Secondary | ICD-10-CM | POA: Diagnosis not present

## 2018-09-16 DIAGNOSIS — E039 Hypothyroidism, unspecified: Secondary | ICD-10-CM | POA: Diagnosis not present

## 2018-09-16 DIAGNOSIS — D509 Iron deficiency anemia, unspecified: Secondary | ICD-10-CM | POA: Diagnosis not present

## 2018-09-16 DIAGNOSIS — Z87440 Personal history of urinary (tract) infections: Secondary | ICD-10-CM | POA: Diagnosis not present

## 2018-09-16 DIAGNOSIS — I7300 Raynaud's syndrome without gangrene: Secondary | ICD-10-CM | POA: Diagnosis not present

## 2018-09-16 DIAGNOSIS — Z9181 History of falling: Secondary | ICD-10-CM | POA: Diagnosis not present

## 2018-09-16 DIAGNOSIS — M8008XD Age-related osteoporosis with current pathological fracture, vertebra(e), subsequent encounter for fracture with routine healing: Secondary | ICD-10-CM | POA: Diagnosis not present

## 2018-09-16 DIAGNOSIS — K5909 Other constipation: Secondary | ICD-10-CM | POA: Diagnosis not present

## 2018-09-16 NOTE — Patient Outreach (Signed)
[facility]) Care Management  09/16/2018  Ken Otoole 1950-12-24 [id number]   Phone call to patient to discuss referral to psychiatry. Patient requested tht this social worker call her back in 30 minutes.   Return phone call to patient who again denies need to follow up with a psychiatrist, stating that her restrictive eating was due to extreme pain and not due to depression or anxiety. Patient declines referral for mental health follow up, however patient encouraged to call this social worker back if needed in the future. This Child psychotherapist will close patient to social work at this time.   Adriana Reams [facility] Care Management [phone]

## 2018-09-16 NOTE — Patient Outreach (Signed)
[facility]) Care Management  09/16/2018  Shayanne Vader July 11, 1951 [id number]   Phone call from [facility] stating that they have reached out to patient again, however was not able to engage her because the person that answered the phone would not give patient the phone without knowing more about the call. Per [facility], they were not able to provide HIPPA related information..   Plan: This Child psychotherapist will follow up with patient regarding this.   Adriana Reams [facility] Care Management [phone]

## 2018-09-17 DIAGNOSIS — I7300 Raynaud's syndrome without gangrene: Secondary | ICD-10-CM | POA: Diagnosis not present

## 2018-09-17 DIAGNOSIS — I480 Paroxysmal atrial fibrillation: Secondary | ICD-10-CM | POA: Diagnosis not present

## 2018-09-17 DIAGNOSIS — M1990 Unspecified osteoarthritis, unspecified site: Secondary | ICD-10-CM | POA: Diagnosis not present

## 2018-09-17 DIAGNOSIS — D509 Iron deficiency anemia, unspecified: Secondary | ICD-10-CM | POA: Diagnosis not present

## 2018-09-17 DIAGNOSIS — Z9181 History of falling: Secondary | ICD-10-CM | POA: Diagnosis not present

## 2018-09-17 DIAGNOSIS — E039 Hypothyroidism, unspecified: Secondary | ICD-10-CM | POA: Diagnosis not present

## 2018-09-17 DIAGNOSIS — I110 Hypertensive heart disease with heart failure: Secondary | ICD-10-CM | POA: Diagnosis not present

## 2018-09-17 DIAGNOSIS — M0689 Other specified rheumatoid arthritis, multiple sites: Secondary | ICD-10-CM | POA: Diagnosis not present

## 2018-09-17 DIAGNOSIS — I5042 Chronic combined systolic (congestive) and diastolic (congestive) heart failure: Secondary | ICD-10-CM | POA: Diagnosis not present

## 2018-09-17 DIAGNOSIS — Z87440 Personal history of urinary (tract) infections: Secondary | ICD-10-CM | POA: Diagnosis not present

## 2018-09-17 DIAGNOSIS — K5909 Other constipation: Secondary | ICD-10-CM | POA: Diagnosis not present

## 2018-09-17 DIAGNOSIS — M8008XD Age-related osteoporosis with current pathological fracture, vertebra(e), subsequent encounter for fracture with routine healing: Secondary | ICD-10-CM | POA: Diagnosis not present

## 2018-09-17 DIAGNOSIS — I959 Hypotension, unspecified: Secondary | ICD-10-CM | POA: Diagnosis not present

## 2018-09-17 DIAGNOSIS — I252 Old myocardial infarction: Secondary | ICD-10-CM | POA: Diagnosis not present

## 2018-09-21 DIAGNOSIS — I959 Hypotension, unspecified: Secondary | ICD-10-CM | POA: Diagnosis not present

## 2018-09-21 DIAGNOSIS — I480 Paroxysmal atrial fibrillation: Secondary | ICD-10-CM | POA: Diagnosis not present

## 2018-09-21 DIAGNOSIS — M0689 Other specified rheumatoid arthritis, multiple sites: Secondary | ICD-10-CM | POA: Diagnosis not present

## 2018-09-21 DIAGNOSIS — I7300 Raynaud's syndrome without gangrene: Secondary | ICD-10-CM | POA: Diagnosis not present

## 2018-09-21 DIAGNOSIS — M8008XD Age-related osteoporosis with current pathological fracture, vertebra(e), subsequent encounter for fracture with routine healing: Secondary | ICD-10-CM | POA: Diagnosis not present

## 2018-09-21 DIAGNOSIS — D509 Iron deficiency anemia, unspecified: Secondary | ICD-10-CM | POA: Diagnosis not present

## 2018-09-21 DIAGNOSIS — I252 Old myocardial infarction: Secondary | ICD-10-CM | POA: Diagnosis not present

## 2018-09-21 DIAGNOSIS — E039 Hypothyroidism, unspecified: Secondary | ICD-10-CM | POA: Diagnosis not present

## 2018-09-21 DIAGNOSIS — Z9181 History of falling: Secondary | ICD-10-CM | POA: Diagnosis not present

## 2018-09-21 DIAGNOSIS — I110 Hypertensive heart disease with heart failure: Secondary | ICD-10-CM | POA: Diagnosis not present

## 2018-09-21 DIAGNOSIS — I5042 Chronic combined systolic (congestive) and diastolic (congestive) heart failure: Secondary | ICD-10-CM | POA: Diagnosis not present

## 2018-09-21 DIAGNOSIS — Z87440 Personal history of urinary (tract) infections: Secondary | ICD-10-CM | POA: Diagnosis not present

## 2018-09-21 DIAGNOSIS — M1990 Unspecified osteoarthritis, unspecified site: Secondary | ICD-10-CM | POA: Diagnosis not present

## 2018-09-21 DIAGNOSIS — K5909 Other constipation: Secondary | ICD-10-CM | POA: Diagnosis not present

## 2018-09-22 ENCOUNTER — Ambulatory Visit (INDEPENDENT_AMBULATORY_CARE_PROVIDER_SITE_OTHER): Payer: Medicare Other | Admitting: Internal Medicine

## 2018-09-22 ENCOUNTER — Encounter: Payer: Self-pay | Admitting: Internal Medicine

## 2018-09-22 ENCOUNTER — Other Ambulatory Visit: Payer: Self-pay

## 2018-09-22 ENCOUNTER — Ambulatory Visit: Payer: Medicare Other | Admitting: Internal Medicine

## 2018-09-22 VITALS — BP 110/66 | HR 94 | Temp 97.6°F | Ht 59.0 in | Wt 81.4 lb

## 2018-09-22 DIAGNOSIS — N3001 Acute cystitis with hematuria: Secondary | ICD-10-CM | POA: Diagnosis not present

## 2018-09-22 DIAGNOSIS — I480 Paroxysmal atrial fibrillation: Secondary | ICD-10-CM | POA: Diagnosis not present

## 2018-09-22 DIAGNOSIS — I5181 Takotsubo syndrome: Secondary | ICD-10-CM | POA: Diagnosis not present

## 2018-09-22 DIAGNOSIS — M069 Rheumatoid arthritis, unspecified: Secondary | ICD-10-CM

## 2018-09-22 DIAGNOSIS — I10 Essential (primary) hypertension: Secondary | ICD-10-CM

## 2018-09-22 DIAGNOSIS — E43 Unspecified severe protein-calorie malnutrition: Secondary | ICD-10-CM

## 2018-09-22 DIAGNOSIS — Z111 Encounter for screening for respiratory tuberculosis: Secondary | ICD-10-CM | POA: Diagnosis not present

## 2018-09-22 DIAGNOSIS — F39 Unspecified mood [affective] disorder: Secondary | ICD-10-CM

## 2018-09-22 DIAGNOSIS — I5032 Chronic diastolic (congestive) heart failure: Secondary | ICD-10-CM

## 2018-09-22 DIAGNOSIS — E034 Atrophy of thyroid (acquired): Secondary | ICD-10-CM

## 2018-09-22 DIAGNOSIS — M5124 Other intervertebral disc displacement, thoracic region: Secondary | ICD-10-CM | POA: Insufficient documentation

## 2018-09-22 LAB — POCT URINALYSIS DIPSTICK
Bilirubin, UA: NEGATIVE
Glucose, UA: NEGATIVE
Ketones, UA: NEGATIVE
Nitrite, UA: NEGATIVE
Protein, UA: POSITIVE — AB
Spec Grav, UA: 1.015 (ref 1.010–1.025)
Urobilinogen, UA: 0.2 E.U./dL
pH, UA: 5 (ref 5.0–8.0)

## 2018-09-22 MED ORDER — CEPHALEXIN 500 MG PO CAPS: 500.0000 mg | ORAL_CAPSULE | Freq: Four times a day (QID) | ORAL | 0 refills | Status: AC

## 2018-09-22 NOTE — Progress Notes (Signed)
Date:  09/22/2018   Name:  Katherine Baird   DOB:  10/27/1950   MRN:  [id number]   Chief Complaint: Independent Living Forms Patient presents for forms to be completed for Independent living. HPI Urinary Tract Infection   This is a new problem. The current episode started in the past 7 days. The quality of the pain is described as burning and aching. The pain is mild. There has been no fever. Pertinent negatives include no chills, flank pain, nausea or sweats. She has tried nothing for the symptoms. and MCTD  Back Pain  This is a chronic problem. The problem occurs daily. The problem has been gradually improving since onset. The pain is present in the thoracic spine. The pain does not radiate. The pain is moderate. The pain is the same all the time. Associated symptoms include numbness and tingling. Risk factors include history of osteoporosis. She has tried analgesics (MRI showed HNP in thoracic spine) for the symptoms. The treatment provided mild relief.  Depression         This is a new problem.  The problem has been gradually improving since onset.  Associated symptoms include insomnia and appetite change.  Associated symptoms include no myalgias, not sad and no suicidal ideas.     The symptoms are aggravated by social issues.  Treatments tried: on remeron.  Compliance with treatment is good.  Risk factors include a recent illness.   (and MCTD) Extremity Weakness   The pain is present in the left hand and right hand. There has been no history of extremity trauma. The problem occurs constantly. The quality of the pain is described as aching (and numbness). The pain is mild. Associated symptoms include an inability to bear weight, joint swelling, a limited range of motion, numbness and tingling. Associated symptoms comments: Worse since hospitalization. She has tried acetaminophen for the symptoms. The treatment provided no relief. Her past medical history is significant for rheumatoid arthritis.  and MCTD  Cardiac - Takotsubo's cardiomyopathy, chronic diastolic heart failure and HTN - stable on chronic medications.  Mild shortness of breath, no edema or orthopnea.  Review of Systems  Constitutional: Positive for appetite change and fatigue. Negative for chills, diaphoresis, fever and unexpected weight change.  HENT: Negative for trouble swallowing.   Respiratory: Negative for cough, chest tightness, shortness of breath and wheezing.   Cardiovascular: Negative for chest pain, palpitations and leg swelling.  Genitourinary: Positive for dysuria, frequency and urgency.  Musculoskeletal: Positive for arthralgias, back pain, gait problem and myalgias.  Skin:       Dry skin  Allergic/Immunologic: Negative for environmental allergies.  Neurological: Negative for dizziness, light-headedness and headaches.  Hematological: Negative for adenopathy.  Psychiatric/Behavioral: Positive for dysphoric mood and sleep disturbance. Negative for suicidal ideas. The patient is not nervous/anxious.     Patient Active Problem List   Diagnosis Date Noted  . HNP (herniated nucleus pulposus), thoracic 09/22/2018  . Paroxysmal atrial fibrillation (HCC) 08/28/2018  . Mood disorder (HCC) 08/10/2018  . Severe protein-calorie malnutrition (HCC) 08/10/2018  . Late effect of lacunar infarction 08/10/2018  . Takotsubo cardiomyopathy 08/10/2018  . Constipation 07/30/2018  . Bladder infection 07/14/2018  . NSTEMI (non-ST elevated myocardial infarction) (HCC) 07/02/2018  . Acute midline thoracic back pain 06/23/2018  . Iron deficiency anemia 02/08/2018  . Fatigue 01/13/2018  . Chronic diastolic heart failure (HCC) 12/17/2017  . 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 Syncope 02/24/2017  . Hypothyroidism 05/24/2016  . HLD (hyperlipidemia) 01/27/2015  .  Paroxysmal digital cyanosis 01/27/2015  . 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 Essential (primary) hypertension 01/11/2015  . Rheumatoid  arthritis involving multiple joints (HCC) 01/11/2015  . 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 Levofloxacin Nausea And Vomiting  . Doxycycline   . Erythromycin   . Gabapentin   . Guaifenesin     "Felt sick" and worsening of symptoms  . Nitrofuran Derivatives   . Prednisone     muscle weakness/pain  . Sulfa Antibiotics   . Tetracycline   . Azithromycin Rash    Severe rash with peeling  . Carvedilol Other (See Comments)    Dose problem with higher doses    Past Surgical History:  Procedure Laterality Date  . LAPAROSCOPIC HYSTERECTOMY  2001   total  . LEFT HEART CATH N/A 07/03/2018   Procedure: Left Heart Cath with Coronary Angiography;  Surgeon: Marcina Millard, MD;  Location: [facility] INVASIVE CV LAB;  Service: Cardiovascular;  Laterality: N/A;    Social History   Tobacco Use  . Smoking status: Never Smoker  . Smokeless tobacco: Never Used  Substance Use Topics  . Alcohol use: No    Alcohol/week: 0.0 standard drinks  . Drug use: No     Medication list has been reviewed and updated.  Current Meds  Medication Sig  . acetaminophen (TYLENOL) 500 MG tablet Take 500 mg by mouth every 8 (eight) hours as needed for moderate pain.  Marland Kitchen aspirin EC 81 MG tablet Take 81 mg by mouth daily.  . carvedilol (COREG) 6.25 MG tablet Take 6.25 mg by mouth 2 (two) times daily with a meal.   . clobetasol ointment (TEMOVATE) 0.05 % Apply 1 application topically 2 (two) times daily as needed (for irritation).   Marland Kitchen denosumab (PROLIA) 60 MG/ML SOSY injection Inject 60 mg into the skin every 6 (six) months.  . docusate sodium (COLACE) 100 MG capsule Take 100 mg by mouth daily as needed for mild constipation. Taking one to two a day  . folic acid (FOLVITE) 1 MG tablet Take 1 mg by mouth daily.  Marland Kitchen levothyroxine (SYNTHROID, LEVOTHROID) 50 MCG tablet Take 50 mcg by mouth daily before breakfast.   . mirtazapine (REMERON) 15 MG tablet Take 15 mg by  mouth at bedtime.  . polyethylene glycol (MIRALAX / GLYCOLAX) packet Take 17 g by mouth daily as needed.   . traMADol (ULTRAM) 50 MG tablet Take 1 tablet (50 mg total) by mouth every 6 (six) hours as needed for moderate pain or severe pain.    PHQ 2/9 Scores 09/22/2018 09/03/2018 07/15/2018 04/06/2018  PHQ - 2 Score 0 0 2 0  PHQ- 9 Score - - 9 -    Physical Exam Vitals signs and nursing note reviewed.  Constitutional:      General: She is not in acute distress.    Appearance: She is cachectic.  HENT:     Head: Normocephalic and atraumatic.     Mouth/Throat:     Mouth: Mucous membranes are moist.  Eyes:     Extraocular Movements: Extraocular movements intact.     Pupils: Pupils are equal, round, and reactive to light.  Neck:     Musculoskeletal: Normal range of motion and neck supple.  Cardiovascular:     Rate and Rhythm: Normal rate and regular rhythm.     Pulses: Normal pulses.     Heart sounds: No murmur.  Pulmonary:     Effort: Pulmonary effort  is normal. No respiratory distress.     Breath sounds: Normal breath sounds. No wheezing or rhonchi.  Abdominal:     General: Abdomen is flat.     Palpations: Abdomen is soft.  Musculoskeletal:     Comments: RA changes both hands Thenar muscle wasting Nail changes  Lymphadenopathy:     Cervical: No cervical adenopathy.  Skin:    General: Skin is warm and dry.     Coloration: Skin is ashen.     Findings: No rash.  Neurological:     Mental Status: She is alert and oriented to person, place, and time.     Motor: Motor function is intact.     Coordination: Romberg sign positive. Heel to [facility] Test abnormal. Finger-Nose-Finger Test normal.     Gait: Gait is intact.     Deep Tendon Reflexes:     Reflex Scores:      Bicep reflexes are 1+ on the right side and 1+ on the left side.      Patellar reflexes are 2+ on the right side and 2+ on the left side. Psychiatric:        Attention and Perception: Attention normal.        Mood and  Affect: Mood normal.        Behavior: Behavior normal.        Thought Content: Thought content normal.     BP 110/66   Pulse 94   Temp 97.6 F (36.4 C)   Ht 4\' 11"  (1.499 m)   Wt 81 lb 6.4 oz (36.9 kg) Comment: wheelchair  SpO2 100%   BMI 16.44 kg/m   Assessment and Plan: 1. Chronic diastolic heart failure (HCC) Stable, continue Coreg  2. Essential (primary) hypertension controlled  3. Paroxysmal atrial fibrillation (HCC) In SR today  4. Takotsubo cardiomyopathy Improving clinically  5. 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 Rheumatoid arthritis involving multiple joints (HCC) With advancing weakness in hands especially  7. HNP (herniated nucleus pulposus), thoracic On Tramadol for pain  8. Mood disorder (HCC) Continue Remeron for mood, sleep and appetite  9. Severe protein-calorie malnutrition (HCC) Continue to advance diet  10. Acute cystitis with hematuria - POCT urinalysis dipstick - cephALEXin (KEFLEX) 500 MG capsule; Take 1 capsule (500 mg total) by mouth 4 (four) times daily for 7 days.  Dispense: 28 capsule; Refill: 0  Form will be completed for Independent Living.  Partially dictated using Animal nutritionist. Any errors are unintentional.  Bari Edward, MD [facility]  09/22/2018

## 2018-09-23 ENCOUNTER — Other Ambulatory Visit: Payer: Self-pay | Admitting: Physical Medicine and Rehabilitation

## 2018-09-23 ENCOUNTER — Other Ambulatory Visit (HOSPITAL_COMMUNITY): Payer: Self-pay | Admitting: Physical Medicine and Rehabilitation

## 2018-09-23 DIAGNOSIS — K5909 Other constipation: Secondary | ICD-10-CM | POA: Diagnosis not present

## 2018-09-23 DIAGNOSIS — I5042 Chronic combined systolic (congestive) and diastolic (congestive) heart failure: Secondary | ICD-10-CM | POA: Diagnosis not present

## 2018-09-23 DIAGNOSIS — I252 Old myocardial infarction: Secondary | ICD-10-CM | POA: Diagnosis not present

## 2018-09-23 DIAGNOSIS — M1990 Unspecified osteoarthritis, unspecified site: Secondary | ICD-10-CM | POA: Diagnosis not present

## 2018-09-23 DIAGNOSIS — M0689 Other specified rheumatoid arthritis, multiple sites: Secondary | ICD-10-CM | POA: Diagnosis not present

## 2018-09-23 DIAGNOSIS — Z87440 Personal history of urinary (tract) infections: Secondary | ICD-10-CM | POA: Diagnosis not present

## 2018-09-23 DIAGNOSIS — M5416 Radiculopathy, lumbar region: Secondary | ICD-10-CM | POA: Diagnosis not present

## 2018-09-23 DIAGNOSIS — I7300 Raynaud's syndrome without gangrene: Secondary | ICD-10-CM | POA: Diagnosis not present

## 2018-09-23 DIAGNOSIS — M5442 Lumbago with sciatica, left side: Secondary | ICD-10-CM | POA: Diagnosis not present

## 2018-09-23 DIAGNOSIS — I110 Hypertensive heart disease with heart failure: Secondary | ICD-10-CM | POA: Diagnosis not present

## 2018-09-23 DIAGNOSIS — I959 Hypotension, unspecified: Secondary | ICD-10-CM | POA: Diagnosis not present

## 2018-09-23 DIAGNOSIS — I480 Paroxysmal atrial fibrillation: Secondary | ICD-10-CM | POA: Diagnosis not present

## 2018-09-23 DIAGNOSIS — D509 Iron deficiency anemia, unspecified: Secondary | ICD-10-CM | POA: Diagnosis not present

## 2018-09-23 DIAGNOSIS — Z9181 History of falling: Secondary | ICD-10-CM | POA: Diagnosis not present

## 2018-09-23 DIAGNOSIS — M5136 Other intervertebral disc degeneration, lumbar region: Secondary | ICD-10-CM | POA: Diagnosis not present

## 2018-09-23 DIAGNOSIS — R200 Anesthesia of skin: Secondary | ICD-10-CM | POA: Diagnosis not present

## 2018-09-23 DIAGNOSIS — M5126 Other intervertebral disc displacement, lumbar region: Secondary | ICD-10-CM | POA: Diagnosis not present

## 2018-09-23 DIAGNOSIS — E039 Hypothyroidism, unspecified: Secondary | ICD-10-CM | POA: Diagnosis not present

## 2018-09-23 DIAGNOSIS — M8008XD Age-related osteoporosis with current pathological fracture, vertebra(e), subsequent encounter for fracture with routine healing: Secondary | ICD-10-CM | POA: Diagnosis not present

## 2018-09-25 DIAGNOSIS — I252 Old myocardial infarction: Secondary | ICD-10-CM | POA: Diagnosis not present

## 2018-09-25 DIAGNOSIS — Z87440 Personal history of urinary (tract) infections: Secondary | ICD-10-CM | POA: Diagnosis not present

## 2018-09-25 DIAGNOSIS — M8008XD Age-related osteoporosis with current pathological fracture, vertebra(e), subsequent encounter for fracture with routine healing: Secondary | ICD-10-CM | POA: Diagnosis not present

## 2018-09-25 DIAGNOSIS — M0689 Other specified rheumatoid arthritis, multiple sites: Secondary | ICD-10-CM | POA: Diagnosis not present

## 2018-09-25 DIAGNOSIS — M1990 Unspecified osteoarthritis, unspecified site: Secondary | ICD-10-CM | POA: Diagnosis not present

## 2018-09-25 DIAGNOSIS — I110 Hypertensive heart disease with heart failure: Secondary | ICD-10-CM | POA: Diagnosis not present

## 2018-09-25 DIAGNOSIS — I959 Hypotension, unspecified: Secondary | ICD-10-CM | POA: Diagnosis not present

## 2018-09-25 DIAGNOSIS — I5042 Chronic combined systolic (congestive) and diastolic (congestive) heart failure: Secondary | ICD-10-CM | POA: Diagnosis not present

## 2018-09-25 DIAGNOSIS — Z9181 History of falling: Secondary | ICD-10-CM | POA: Diagnosis not present

## 2018-09-25 DIAGNOSIS — I480 Paroxysmal atrial fibrillation: Secondary | ICD-10-CM | POA: Diagnosis not present

## 2018-09-25 DIAGNOSIS — D509 Iron deficiency anemia, unspecified: Secondary | ICD-10-CM | POA: Diagnosis not present

## 2018-09-25 DIAGNOSIS — K5909 Other constipation: Secondary | ICD-10-CM | POA: Diagnosis not present

## 2018-09-25 DIAGNOSIS — E039 Hypothyroidism, unspecified: Secondary | ICD-10-CM | POA: Diagnosis not present

## 2018-09-25 DIAGNOSIS — I7300 Raynaud's syndrome without gangrene: Secondary | ICD-10-CM | POA: Diagnosis not present

## 2018-09-25 NOTE — Telephone Encounter (Signed)
Scheduled AWV- I for patient on 10/14/2018 at 2:40 PM.  Trixie Rude, Care Guide.

## 2018-09-28 DIAGNOSIS — I7300 Raynaud's syndrome without gangrene: Secondary | ICD-10-CM | POA: Diagnosis not present

## 2018-09-28 DIAGNOSIS — M8008XD Age-related osteoporosis with current pathological fracture, vertebra(e), subsequent encounter for fracture with routine healing: Secondary | ICD-10-CM | POA: Diagnosis not present

## 2018-09-28 DIAGNOSIS — Z87440 Personal history of urinary (tract) infections: Secondary | ICD-10-CM | POA: Diagnosis not present

## 2018-09-28 DIAGNOSIS — K5909 Other constipation: Secondary | ICD-10-CM | POA: Diagnosis not present

## 2018-09-28 DIAGNOSIS — E039 Hypothyroidism, unspecified: Secondary | ICD-10-CM | POA: Diagnosis not present

## 2018-09-28 DIAGNOSIS — D509 Iron deficiency anemia, unspecified: Secondary | ICD-10-CM | POA: Diagnosis not present

## 2018-09-28 DIAGNOSIS — M0689 Other specified rheumatoid arthritis, multiple sites: Secondary | ICD-10-CM | POA: Diagnosis not present

## 2018-09-28 DIAGNOSIS — I252 Old myocardial infarction: Secondary | ICD-10-CM | POA: Diagnosis not present

## 2018-09-28 DIAGNOSIS — Z9181 History of falling: Secondary | ICD-10-CM | POA: Diagnosis not present

## 2018-09-28 DIAGNOSIS — I959 Hypotension, unspecified: Secondary | ICD-10-CM | POA: Diagnosis not present

## 2018-09-28 DIAGNOSIS — M1990 Unspecified osteoarthritis, unspecified site: Secondary | ICD-10-CM | POA: Diagnosis not present

## 2018-09-28 DIAGNOSIS — I110 Hypertensive heart disease with heart failure: Secondary | ICD-10-CM | POA: Diagnosis not present

## 2018-09-28 DIAGNOSIS — I480 Paroxysmal atrial fibrillation: Secondary | ICD-10-CM | POA: Diagnosis not present

## 2018-09-28 DIAGNOSIS — I5042 Chronic combined systolic (congestive) and diastolic (congestive) heart failure: Secondary | ICD-10-CM | POA: Diagnosis not present

## 2018-09-29 ENCOUNTER — Ambulatory Visit
Admission: RE | Admit: 2018-09-29 | Discharge: 2018-09-29 | Disposition: A | Discharge status: Home / Self Care | Payer: Medicare Other | Source: Ambulatory Visit | Attending: Physical Medicine and Rehabilitation | Admitting: Physical Medicine and Rehabilitation

## 2018-09-29 DIAGNOSIS — I110 Hypertensive heart disease with heart failure: Secondary | ICD-10-CM | POA: Diagnosis not present

## 2018-09-29 DIAGNOSIS — K5909 Other constipation: Secondary | ICD-10-CM | POA: Diagnosis not present

## 2018-09-29 DIAGNOSIS — Z9181 History of falling: Secondary | ICD-10-CM | POA: Diagnosis not present

## 2018-09-29 DIAGNOSIS — M545 Low back pain: Secondary | ICD-10-CM | POA: Diagnosis not present

## 2018-09-29 DIAGNOSIS — M1990 Unspecified osteoarthritis, unspecified site: Secondary | ICD-10-CM | POA: Diagnosis not present

## 2018-09-29 DIAGNOSIS — I959 Hypotension, unspecified: Secondary | ICD-10-CM | POA: Diagnosis not present

## 2018-09-29 DIAGNOSIS — M8008XD Age-related osteoporosis with current pathological fracture, vertebra(e), subsequent encounter for fracture with routine healing: Secondary | ICD-10-CM | POA: Diagnosis not present

## 2018-09-29 DIAGNOSIS — D509 Iron deficiency anemia, unspecified: Secondary | ICD-10-CM | POA: Diagnosis not present

## 2018-09-29 DIAGNOSIS — M5416 Radiculopathy, lumbar region: Secondary | ICD-10-CM | POA: Diagnosis not present

## 2018-09-29 DIAGNOSIS — M0689 Other specified rheumatoid arthritis, multiple sites: Secondary | ICD-10-CM | POA: Diagnosis not present

## 2018-09-29 DIAGNOSIS — I252 Old myocardial infarction: Secondary | ICD-10-CM | POA: Diagnosis not present

## 2018-09-29 DIAGNOSIS — Z87440 Personal history of urinary (tract) infections: Secondary | ICD-10-CM | POA: Diagnosis not present

## 2018-09-29 DIAGNOSIS — I480 Paroxysmal atrial fibrillation: Secondary | ICD-10-CM | POA: Diagnosis not present

## 2018-09-29 DIAGNOSIS — I5042 Chronic combined systolic (congestive) and diastolic (congestive) heart failure: Secondary | ICD-10-CM | POA: Diagnosis not present

## 2018-09-29 DIAGNOSIS — E039 Hypothyroidism, unspecified: Secondary | ICD-10-CM | POA: Diagnosis not present

## 2018-09-29 DIAGNOSIS — I7300 Raynaud's syndrome without gangrene: Secondary | ICD-10-CM | POA: Diagnosis not present

## 2018-09-30 ENCOUNTER — Other Ambulatory Visit (INDEPENDENT_AMBULATORY_CARE_PROVIDER_SITE_OTHER): Payer: Medicare Other | Admitting: Internal Medicine

## 2018-09-30 DIAGNOSIS — I5032 Chronic diastolic (congestive) heart failure: Secondary | ICD-10-CM

## 2018-09-30 DIAGNOSIS — I480 Paroxysmal atrial fibrillation: Secondary | ICD-10-CM

## 2018-09-30 DIAGNOSIS — I10 Essential (primary) hypertension: Secondary | ICD-10-CM

## 2018-09-30 DIAGNOSIS — M79602 Pain in left arm: Secondary | ICD-10-CM | POA: Diagnosis not present

## 2018-09-30 DIAGNOSIS — I5181 Takotsubo syndrome: Secondary | ICD-10-CM

## 2018-09-30 DIAGNOSIS — E034 Atrophy of thyroid (acquired): Secondary | ICD-10-CM

## 2018-09-30 DIAGNOSIS — I252 Old myocardial infarction: Secondary | ICD-10-CM

## 2018-09-30 DIAGNOSIS — M79601 Pain in right arm: Secondary | ICD-10-CM | POA: Diagnosis not present

## 2018-09-30 DIAGNOSIS — M8008XD Age-related osteoporosis with current pathological fracture, vertebra(e), subsequent encounter for fracture with routine healing: Secondary | ICD-10-CM | POA: Insufficient documentation

## 2018-09-30 DIAGNOSIS — M069 Rheumatoid arthritis, unspecified: Secondary | ICD-10-CM

## 2018-09-30 DIAGNOSIS — I7300 Raynaud's syndrome without gangrene: Secondary | ICD-10-CM

## 2018-09-30 DIAGNOSIS — Z9181 History of falling: Secondary | ICD-10-CM | POA: Insufficient documentation

## 2018-09-30 DIAGNOSIS — M359 Systemic involvement of connective tissue, unspecified: Secondary | ICD-10-CM

## 2018-09-30 DIAGNOSIS — D509 Iron deficiency anemia, unspecified: Secondary | ICD-10-CM

## 2018-09-30 DIAGNOSIS — K5901 Slow transit constipation: Secondary | ICD-10-CM

## 2018-09-30 NOTE — Progress Notes (Signed)
Received orders from [facility] [place] [facility]. Orders date 09/05/2018 through 11/03/2018.  Start of care 07/07/18. Orders are reviewed, signed and faxed.

## 2018-10-03 ENCOUNTER — Ambulatory Visit: Payer: Medicare Other

## 2018-10-06 ENCOUNTER — Other Ambulatory Visit: Payer: Self-pay | Admitting: Physical Medicine and Rehabilitation

## 2018-10-06 ENCOUNTER — Telehealth: Payer: Self-pay | Admitting: Dietician

## 2018-10-06 ENCOUNTER — Other Ambulatory Visit (HOSPITAL_COMMUNITY): Payer: Self-pay | Admitting: Physical Medicine and Rehabilitation

## 2018-10-06 DIAGNOSIS — M5136 Other intervertebral disc degeneration, lumbar region: Secondary | ICD-10-CM | POA: Diagnosis not present

## 2018-10-06 DIAGNOSIS — M5412 Radiculopathy, cervical region: Secondary | ICD-10-CM

## 2018-10-06 DIAGNOSIS — M5442 Lumbago with sciatica, left side: Secondary | ICD-10-CM | POA: Diagnosis not present

## 2018-10-06 DIAGNOSIS — R200 Anesthesia of skin: Secondary | ICD-10-CM | POA: Diagnosis not present

## 2018-10-06 DIAGNOSIS — M5416 Radiculopathy, lumbar region: Secondary | ICD-10-CM | POA: Diagnosis not present

## 2018-10-06 DIAGNOSIS — M5126 Other intervertebral disc displacement, lumbar region: Secondary | ICD-10-CM | POA: Diagnosis not present

## 2018-10-06 NOTE — Telephone Encounter (Signed)
Patient's sister called to cancel Ms. Duran's appointment for 10/08/18 due to other medical issues at this time.

## 2018-10-08 ENCOUNTER — Ambulatory Visit: Payer: Medicare Other | Admitting: Dietician

## 2018-10-09 ENCOUNTER — Other Ambulatory Visit: Payer: Self-pay | Admitting: *Deleted

## 2018-10-09 ENCOUNTER — Encounter: Payer: Self-pay | Admitting: Dietician

## 2018-10-09 NOTE — Patient Outreach (Signed)
[facility]) Care Management  10/09/2018  Katherine Baird 06-05-1951 [id number]   Care coordination call   Received return call from [place] at [facility] regarding home health physical therapy orders, she reports patient case has been closed in the last 2 weeks and they have not received new orders.   Placed call to [name], Physical medicine and rehab office , regarding patient report that home health services for physical therapy are to be resumed, representative states that she will follow up on that and return call to me.   Received return call from emily at [name] office that states [place] home health care has been referred per request of [name], and to begin services on 2/15. Irving Burton reports that she will notify patient also.   Plan Will update patient on communication regarding home health services and beginning of service with call on next week.    [name], RN, [facility] Care Management,Care Management Coordinator  [phone]- Mobile [phone]- Toll Free Main Office

## 2018-10-09 NOTE — Progress Notes (Signed)
Patient cancelled her follow-up appointment for 10/08/18, via phone call from her sister, Riki Sheer. Sent letter to referring provider.

## 2018-10-09 NOTE — Patient Outreach (Signed)
[facility]) Care Management  10/09/2018  Katherine Baird [birth year] [id number]   Telephone follow up call   Transition of care by PCP office Telephone follow up call   Referral received : 11/8 Referral source : [facility] Care Manager Referral reason : [facility] inpatient admission , 117-11/8 NSTEMI , cardiac cath results : normal coronary anatomy , overall preserved left ventricular function ,Takotsubo cardiomyopathy  Insurance : [facility] Medicare   PMHx noted : significant for Hypertension , Diastolic Heart failure, recent UTI , back pain,iron deficiency , osteoporosis  ED visits 11/3 Dx: Chest pain  ED visit 11/22 at [facility] ED Dx : Pyelonephritis , back pain  ED visit 11/24 at [facility] ED Dx: Abdominal pain, Constipation. Referral to [facility] spine center regarding T4 compression fracture, await phone call within a week regarding scheduling an appointment . Admission to [facility] 12/4- 12/10 Dx: Vomiting intractability, abdominal pain, dehydration, constipation, FTT, chronic/remote Lacunar infarcts.    Successful telephone outreach call to patient, she discussed not feeling real great on today, reports having a setback due to pain in her back related to disc disease. She discussed recent visit to [name] and plans for injection and to resume home health physical therapy . Patient discussed that she has not heard from home health yet.   Patient further discussed other main concerns   Nutrition/Weight  Patient reported she continues to work on eating , states that she has been forcing herself to eat as she know that she needs the nutrition and protein . She discussed not being hungry until yesterday and reports appetite improved.  Patient reports focusing on eating proteins, trying to eat small frequents amounts throughout the day.  Patient reports that her weight today is 50, she denies any recent weigh loss, she continues to focus on weight gain. Reinforced  benefit to nutrition for  Health and  Strength, how nutrition effects improvements .  Encouraged patient regarding rescheduling nutrition clinic appointment that she recently cancelled, reviewed benefit of support.   Discussed patient recent decline of mental health referral after arranged by LCSW, patient thought that she was doing much better and did not need follow up at this time. Discussed how mental health support is important with dealing with chronic conditions and dealings with recent life changes. Patient provided contact number with [facility] , [phone], as she may considering contacting them later. Fall Risk  Patient reports no recent fall , she continues to use walker per report and recently purchased rollator. Patient will continue to benefit from home therapy.   Social  Patient is still staying at her moms home during the night and returning to her home during the day, as she has transportation.  Patient discussed plans to move into independent living area in the near future when she gets stronger, she does not feel safe at this time . She discussed family will provide support at the time of move.  Medications  Patient discussed that she continues to take medications daily as prescribed and her sister continues to organize medications .   Plan Placed call to [facility] to follow up on home health orders able to leave a message for return call.  Reinforced importance of nutrition .  Fall precautions reinforced, and continued follow up appointments with pain management and keeping all PCP appointments.  Will plan return call in the next month and sooner to update of home health communication .    [name], RN, PCCN [facility]  Care Management,Care Management Coordinator  [phone]- Mobile [phone]- Toll Free Main Office

## 2018-10-10 DIAGNOSIS — I7300 Raynaud's syndrome without gangrene: Secondary | ICD-10-CM | POA: Diagnosis not present

## 2018-10-10 DIAGNOSIS — Z79891 Long term (current) use of opiate analgesic: Secondary | ICD-10-CM | POA: Diagnosis not present

## 2018-10-10 DIAGNOSIS — M3500 Sicca syndrome, unspecified: Secondary | ICD-10-CM | POA: Diagnosis not present

## 2018-10-10 DIAGNOSIS — I429 Cardiomyopathy, unspecified: Secondary | ICD-10-CM | POA: Diagnosis not present

## 2018-10-10 DIAGNOSIS — M5115 Intervertebral disc disorders with radiculopathy, thoracolumbar region: Secondary | ICD-10-CM | POA: Diagnosis not present

## 2018-10-10 DIAGNOSIS — M4185 Other forms of scoliosis, thoracolumbar region: Secondary | ICD-10-CM | POA: Diagnosis not present

## 2018-10-10 DIAGNOSIS — M810 Age-related osteoporosis without current pathological fracture: Secondary | ICD-10-CM | POA: Diagnosis not present

## 2018-10-10 DIAGNOSIS — Z9181 History of falling: Secondary | ICD-10-CM | POA: Diagnosis not present

## 2018-10-10 DIAGNOSIS — K219 Gastro-esophageal reflux disease without esophagitis: Secondary | ICD-10-CM | POA: Diagnosis not present

## 2018-10-10 DIAGNOSIS — E785 Hyperlipidemia, unspecified: Secondary | ICD-10-CM | POA: Diagnosis not present

## 2018-10-10 DIAGNOSIS — I10 Essential (primary) hypertension: Secondary | ICD-10-CM | POA: Diagnosis not present

## 2018-10-10 DIAGNOSIS — M1990 Unspecified osteoarthritis, unspecified site: Secondary | ICD-10-CM | POA: Diagnosis not present

## 2018-10-10 DIAGNOSIS — G629 Polyneuropathy, unspecified: Secondary | ICD-10-CM | POA: Diagnosis not present

## 2018-10-10 DIAGNOSIS — Z7982 Long term (current) use of aspirin: Secondary | ICD-10-CM | POA: Diagnosis not present

## 2018-10-10 DIAGNOSIS — L309 Dermatitis, unspecified: Secondary | ICD-10-CM | POA: Diagnosis not present

## 2018-10-10 DIAGNOSIS — M069 Rheumatoid arthritis, unspecified: Secondary | ICD-10-CM | POA: Diagnosis not present

## 2018-10-12 ENCOUNTER — Other Ambulatory Visit: Payer: Self-pay | Admitting: *Deleted

## 2018-10-12 ENCOUNTER — Telehealth: Payer: Self-pay

## 2018-10-12 DIAGNOSIS — E785 Hyperlipidemia, unspecified: Secondary | ICD-10-CM | POA: Diagnosis not present

## 2018-10-12 DIAGNOSIS — M3500 Sicca syndrome, unspecified: Secondary | ICD-10-CM | POA: Diagnosis not present

## 2018-10-12 DIAGNOSIS — M069 Rheumatoid arthritis, unspecified: Secondary | ICD-10-CM | POA: Diagnosis not present

## 2018-10-12 DIAGNOSIS — M1990 Unspecified osteoarthritis, unspecified site: Secondary | ICD-10-CM | POA: Diagnosis not present

## 2018-10-12 DIAGNOSIS — M5115 Intervertebral disc disorders with radiculopathy, thoracolumbar region: Secondary | ICD-10-CM | POA: Diagnosis not present

## 2018-10-12 DIAGNOSIS — L309 Dermatitis, unspecified: Secondary | ICD-10-CM | POA: Diagnosis not present

## 2018-10-12 DIAGNOSIS — Z79891 Long term (current) use of opiate analgesic: Secondary | ICD-10-CM | POA: Diagnosis not present

## 2018-10-12 DIAGNOSIS — Z9181 History of falling: Secondary | ICD-10-CM | POA: Diagnosis not present

## 2018-10-12 DIAGNOSIS — I10 Essential (primary) hypertension: Secondary | ICD-10-CM | POA: Diagnosis not present

## 2018-10-12 DIAGNOSIS — M810 Age-related osteoporosis without current pathological fracture: Secondary | ICD-10-CM | POA: Diagnosis not present

## 2018-10-12 DIAGNOSIS — I429 Cardiomyopathy, unspecified: Secondary | ICD-10-CM | POA: Diagnosis not present

## 2018-10-12 DIAGNOSIS — K219 Gastro-esophageal reflux disease without esophagitis: Secondary | ICD-10-CM | POA: Diagnosis not present

## 2018-10-12 DIAGNOSIS — I7300 Raynaud's syndrome without gangrene: Secondary | ICD-10-CM | POA: Diagnosis not present

## 2018-10-12 DIAGNOSIS — G629 Polyneuropathy, unspecified: Secondary | ICD-10-CM | POA: Diagnosis not present

## 2018-10-12 DIAGNOSIS — Z7982 Long term (current) use of aspirin: Secondary | ICD-10-CM | POA: Diagnosis not present

## 2018-10-12 DIAGNOSIS — M4185 Other forms of scoliosis, thoracolumbar region: Secondary | ICD-10-CM | POA: Diagnosis not present

## 2018-10-12 DIAGNOSIS — M533 Sacrococcygeal disorders, not elsewhere classified: Secondary | ICD-10-CM | POA: Diagnosis not present

## 2018-10-12 NOTE — Telephone Encounter (Signed)
[facility] Care Coordinator called for patient saying she did have a fall yesterday while putting clothes from washer into the dryer in her home. She said she didn't have her walker and that's why she fell.  Patient reports she feels fine, no dizziness. Will come see Dr Judithann Graves if she feels its necessary. Patient fell on her tailbone but did not hit her head or anything other body part besides her tailbone.  [facility] also reminded her of her appt with for MAW this weekend.   Pt verbalized understanding of this.

## 2018-10-12 NOTE — Patient Outreach (Signed)
[facility]) Care Management  10/12/2018  Katherine Baird 04/01/1951 [id number]  Care Coordination call  Placed follow up call to patient to verify home health services, she reports , she communicated [facility] as previous follow up with [name] office. She states [facility] visited on 2/15 for initial visit and plans to resume therapy this week.   During call patient reported having a fall this morning , hard  onto her tail bone , she reports that she was putting clothes from washer to dryer, she was not using her walker at the time, she denies dizziness was able to get up on her own . Patient continues to complain of pain but reports , she has been able to tolerate mobility in the home since then using her walker .  Patient reports that she did notify Dr.Kernolde office of fall with recommendations to go to walk in clinic or PCP , she also notified Dr. Yves Dill office, that she has a procedure injection with this week as well as MRI.   Patient states that her sister in law is with her at this time.  Patient discussed that she has  not been able to sleep much over the last week.  Fall preventions measures reviewed, stressed importance of using walker.   Encouraged again importance of nutrition patient reports that she is working hard on that.  Patient discussed that now that she understands the reason for mental health follow up she plans to give them a call to scheduled visit, again provided patient with contact [facility] care.   Reminded patient of medicare wellness visit at Dr.Beglund office on this week, patient states that she had forgotten and wrote date and time down and will discuss with her sister that provides transportation.   Plan Placed call to Chassidy at [name] office to notify of patient fall on today, discussed patient call to Dr.Kernodle and recommendation of walk in clinic or PCP .Per Chassidy she is  discuss with [name] .  Will plan follow up call to patient in the next week.  Reinforced fall precautions and notifying MD of occurrences.    [name], RN, [facility] Care Management,Care Management Coordinator  [phone]- Mobile [phone]- Toll Free Main Office

## 2018-10-13 ENCOUNTER — Ambulatory Visit
Admission: RE | Admit: 2018-10-13 | Discharge: 2018-10-13 | Disposition: A | Discharge status: Home / Self Care | Payer: Medicare Other | Source: Ambulatory Visit | Attending: Physical Medicine and Rehabilitation | Admitting: Physical Medicine and Rehabilitation

## 2018-10-13 DIAGNOSIS — M542 Cervicalgia: Secondary | ICD-10-CM | POA: Diagnosis not present

## 2018-10-13 DIAGNOSIS — M5412 Radiculopathy, cervical region: Secondary | ICD-10-CM | POA: Insufficient documentation

## 2018-10-14 ENCOUNTER — Ambulatory Visit: Payer: Medicare Other

## 2018-10-14 DIAGNOSIS — M810 Age-related osteoporosis without current pathological fracture: Secondary | ICD-10-CM | POA: Diagnosis not present

## 2018-10-14 DIAGNOSIS — M069 Rheumatoid arthritis, unspecified: Secondary | ICD-10-CM | POA: Diagnosis not present

## 2018-10-14 DIAGNOSIS — M1990 Unspecified osteoarthritis, unspecified site: Secondary | ICD-10-CM | POA: Diagnosis not present

## 2018-10-14 DIAGNOSIS — L309 Dermatitis, unspecified: Secondary | ICD-10-CM | POA: Diagnosis not present

## 2018-10-14 DIAGNOSIS — I7300 Raynaud's syndrome without gangrene: Secondary | ICD-10-CM | POA: Diagnosis not present

## 2018-10-14 DIAGNOSIS — Z9181 History of falling: Secondary | ICD-10-CM | POA: Diagnosis not present

## 2018-10-14 DIAGNOSIS — G629 Polyneuropathy, unspecified: Secondary | ICD-10-CM | POA: Diagnosis not present

## 2018-10-14 DIAGNOSIS — Z7982 Long term (current) use of aspirin: Secondary | ICD-10-CM | POA: Diagnosis not present

## 2018-10-14 DIAGNOSIS — M4185 Other forms of scoliosis, thoracolumbar region: Secondary | ICD-10-CM | POA: Diagnosis not present

## 2018-10-14 DIAGNOSIS — I10 Essential (primary) hypertension: Secondary | ICD-10-CM | POA: Diagnosis not present

## 2018-10-14 DIAGNOSIS — E785 Hyperlipidemia, unspecified: Secondary | ICD-10-CM | POA: Diagnosis not present

## 2018-10-14 DIAGNOSIS — K219 Gastro-esophageal reflux disease without esophagitis: Secondary | ICD-10-CM | POA: Diagnosis not present

## 2018-10-14 DIAGNOSIS — I429 Cardiomyopathy, unspecified: Secondary | ICD-10-CM | POA: Diagnosis not present

## 2018-10-14 DIAGNOSIS — M5115 Intervertebral disc disorders with radiculopathy, thoracolumbar region: Secondary | ICD-10-CM | POA: Diagnosis not present

## 2018-10-14 DIAGNOSIS — Z79891 Long term (current) use of opiate analgesic: Secondary | ICD-10-CM | POA: Diagnosis not present

## 2018-10-14 DIAGNOSIS — M3500 Sicca syndrome, unspecified: Secondary | ICD-10-CM | POA: Diagnosis not present

## 2018-10-16 DIAGNOSIS — M4185 Other forms of scoliosis, thoracolumbar region: Secondary | ICD-10-CM | POA: Diagnosis not present

## 2018-10-16 DIAGNOSIS — Z9181 History of falling: Secondary | ICD-10-CM | POA: Diagnosis not present

## 2018-10-16 DIAGNOSIS — M810 Age-related osteoporosis without current pathological fracture: Secondary | ICD-10-CM | POA: Diagnosis not present

## 2018-10-16 DIAGNOSIS — M3500 Sicca syndrome, unspecified: Secondary | ICD-10-CM | POA: Diagnosis not present

## 2018-10-16 DIAGNOSIS — M5126 Other intervertebral disc displacement, lumbar region: Secondary | ICD-10-CM | POA: Diagnosis not present

## 2018-10-16 DIAGNOSIS — I10 Essential (primary) hypertension: Secondary | ICD-10-CM | POA: Diagnosis not present

## 2018-10-16 DIAGNOSIS — M5416 Radiculopathy, lumbar region: Secondary | ICD-10-CM | POA: Diagnosis not present

## 2018-10-16 DIAGNOSIS — E785 Hyperlipidemia, unspecified: Secondary | ICD-10-CM | POA: Diagnosis not present

## 2018-10-16 DIAGNOSIS — K219 Gastro-esophageal reflux disease without esophagitis: Secondary | ICD-10-CM | POA: Diagnosis not present

## 2018-10-16 DIAGNOSIS — Z7982 Long term (current) use of aspirin: Secondary | ICD-10-CM | POA: Diagnosis not present

## 2018-10-16 DIAGNOSIS — M1990 Unspecified osteoarthritis, unspecified site: Secondary | ICD-10-CM | POA: Diagnosis not present

## 2018-10-16 DIAGNOSIS — I7300 Raynaud's syndrome without gangrene: Secondary | ICD-10-CM | POA: Diagnosis not present

## 2018-10-16 DIAGNOSIS — M069 Rheumatoid arthritis, unspecified: Secondary | ICD-10-CM | POA: Diagnosis not present

## 2018-10-16 DIAGNOSIS — G629 Polyneuropathy, unspecified: Secondary | ICD-10-CM | POA: Diagnosis not present

## 2018-10-16 DIAGNOSIS — L309 Dermatitis, unspecified: Secondary | ICD-10-CM | POA: Diagnosis not present

## 2018-10-16 DIAGNOSIS — I429 Cardiomyopathy, unspecified: Secondary | ICD-10-CM | POA: Diagnosis not present

## 2018-10-16 DIAGNOSIS — Z79891 Long term (current) use of opiate analgesic: Secondary | ICD-10-CM | POA: Diagnosis not present

## 2018-10-16 DIAGNOSIS — M5115 Intervertebral disc disorders with radiculopathy, thoracolumbar region: Secondary | ICD-10-CM | POA: Diagnosis not present

## 2018-10-19 DIAGNOSIS — Z79891 Long term (current) use of opiate analgesic: Secondary | ICD-10-CM | POA: Diagnosis not present

## 2018-10-19 DIAGNOSIS — M3500 Sicca syndrome, unspecified: Secondary | ICD-10-CM | POA: Diagnosis not present

## 2018-10-19 DIAGNOSIS — M1990 Unspecified osteoarthritis, unspecified site: Secondary | ICD-10-CM | POA: Diagnosis not present

## 2018-10-19 DIAGNOSIS — Z9181 History of falling: Secondary | ICD-10-CM | POA: Diagnosis not present

## 2018-10-19 DIAGNOSIS — M069 Rheumatoid arthritis, unspecified: Secondary | ICD-10-CM | POA: Diagnosis not present

## 2018-10-19 DIAGNOSIS — G629 Polyneuropathy, unspecified: Secondary | ICD-10-CM | POA: Diagnosis not present

## 2018-10-19 DIAGNOSIS — Z7982 Long term (current) use of aspirin: Secondary | ICD-10-CM | POA: Diagnosis not present

## 2018-10-19 DIAGNOSIS — I429 Cardiomyopathy, unspecified: Secondary | ICD-10-CM | POA: Diagnosis not present

## 2018-10-19 DIAGNOSIS — M810 Age-related osteoporosis without current pathological fracture: Secondary | ICD-10-CM | POA: Diagnosis not present

## 2018-10-19 DIAGNOSIS — L309 Dermatitis, unspecified: Secondary | ICD-10-CM | POA: Diagnosis not present

## 2018-10-19 DIAGNOSIS — E785 Hyperlipidemia, unspecified: Secondary | ICD-10-CM | POA: Diagnosis not present

## 2018-10-19 DIAGNOSIS — M4185 Other forms of scoliosis, thoracolumbar region: Secondary | ICD-10-CM | POA: Diagnosis not present

## 2018-10-19 DIAGNOSIS — K219 Gastro-esophageal reflux disease without esophagitis: Secondary | ICD-10-CM | POA: Diagnosis not present

## 2018-10-19 DIAGNOSIS — M5115 Intervertebral disc disorders with radiculopathy, thoracolumbar region: Secondary | ICD-10-CM | POA: Diagnosis not present

## 2018-10-19 DIAGNOSIS — I7300 Raynaud's syndrome without gangrene: Secondary | ICD-10-CM | POA: Diagnosis not present

## 2018-10-19 DIAGNOSIS — I10 Essential (primary) hypertension: Secondary | ICD-10-CM | POA: Diagnosis not present

## 2018-10-20 ENCOUNTER — Other Ambulatory Visit: Payer: Self-pay | Admitting: *Deleted

## 2018-10-20 NOTE — Patient Outreach (Signed)
[facility]) Care Management  10/20/2018  Amylynn Tindol [birth year] [id number]   Referral received : 11/8 Referral source : [facility] Care Manager Referral reason : [facility] inpatient admission , 117-11/8 NSTEMI , cardiac cath results : normal coronary anatomy , overall preserved left ventricular function ,Takotsubo cardiomyopathy  Insurance : [facility] Medicare   PMHx noted : significant for Hypertension , Diastolic Heart failure, recent UTI , back pain,iron deficiency , osteoporosis  ED visits 11/3 Dx: Chest pain  ED visit 11/22 at [facility] ED Dx : Pyelonephritis , back pain  ED visit 11/24 at [facility] ED Dx: Abdominal pain, Constipation. Referral to [facility] spine center regarding T4 compression fracture, await phone call within a week regarding scheduling an appointment . Admission to [facility] 12/4- 12/10 Dx: Vomiting intractability, abdominal pain, dehydration, constipation, FTT, chronic/remote Lacunar infarcts.   Unsuccessful outreach call to patient home number , no answer able to leave a HIPAA compliant message for return call . Attempted call to alternate number at home of patient mother where she stays some during the day, person answering the phone states that patient is at her home.   Plan  Will await return call, if no response will schedule return call in the next 4 business days.   [name], RN, [facility] Care Management,Care Management Coordinator  [phone]- Mobile [phone]- Toll Free Main Office

## 2018-10-21 ENCOUNTER — Other Ambulatory Visit: Payer: Self-pay | Admitting: *Deleted

## 2018-10-21 DIAGNOSIS — I10 Essential (primary) hypertension: Secondary | ICD-10-CM | POA: Diagnosis not present

## 2018-10-21 DIAGNOSIS — I7300 Raynaud's syndrome without gangrene: Secondary | ICD-10-CM | POA: Diagnosis not present

## 2018-10-21 DIAGNOSIS — M4185 Other forms of scoliosis, thoracolumbar region: Secondary | ICD-10-CM | POA: Diagnosis not present

## 2018-10-21 DIAGNOSIS — Z9181 History of falling: Secondary | ICD-10-CM | POA: Diagnosis not present

## 2018-10-21 DIAGNOSIS — G629 Polyneuropathy, unspecified: Secondary | ICD-10-CM | POA: Diagnosis not present

## 2018-10-21 DIAGNOSIS — M5115 Intervertebral disc disorders with radiculopathy, thoracolumbar region: Secondary | ICD-10-CM | POA: Diagnosis not present

## 2018-10-21 DIAGNOSIS — I429 Cardiomyopathy, unspecified: Secondary | ICD-10-CM | POA: Diagnosis not present

## 2018-10-21 DIAGNOSIS — Z7982 Long term (current) use of aspirin: Secondary | ICD-10-CM | POA: Diagnosis not present

## 2018-10-21 DIAGNOSIS — L309 Dermatitis, unspecified: Secondary | ICD-10-CM | POA: Diagnosis not present

## 2018-10-21 DIAGNOSIS — M3500 Sicca syndrome, unspecified: Secondary | ICD-10-CM | POA: Diagnosis not present

## 2018-10-21 DIAGNOSIS — Z79891 Long term (current) use of opiate analgesic: Secondary | ICD-10-CM | POA: Diagnosis not present

## 2018-10-21 DIAGNOSIS — K219 Gastro-esophageal reflux disease without esophagitis: Secondary | ICD-10-CM | POA: Diagnosis not present

## 2018-10-21 DIAGNOSIS — M069 Rheumatoid arthritis, unspecified: Secondary | ICD-10-CM | POA: Diagnosis not present

## 2018-10-21 DIAGNOSIS — M810 Age-related osteoporosis without current pathological fracture: Secondary | ICD-10-CM | POA: Diagnosis not present

## 2018-10-21 DIAGNOSIS — E785 Hyperlipidemia, unspecified: Secondary | ICD-10-CM | POA: Diagnosis not present

## 2018-10-21 DIAGNOSIS — M1990 Unspecified osteoarthritis, unspecified site: Secondary | ICD-10-CM | POA: Diagnosis not present

## 2018-10-21 NOTE — Patient Outreach (Signed)
[facility]) Care Management  10/21/2018  Katherine Baird 1951-08-15 [id number]   Telephone follow up call   Referral received : 11/8 Referral source : [place] Referral reason : [facility] inpatient admission , 117-11/8 NSTEMI , cardiac cath results : normal coronary anatomy , overall preserved left ventricular function ,Takotsubo cardiomyopathy  Insurance : [facility] Medicare   PMHx noted : significant for Hypertension , Diastolic Heart failure, recent UTI , back pain,iron deficiency , osteoporosis  ED visits 11/3 Dx: Chest pain  ED visit 11/22 at [facility] ED Dx : Pyelonephritis , back pain  ED visit 11/24 at [facility] ED Dx: Abdominal pain, Constipation. Referral to [facility] spine center regarding T4 compression fracture, await phone call within a week regarding scheduling an appointment . Admission to [facility] 12/4- 12/10 Dx: Vomiting intractability, abdominal pain, dehydration, constipation, FTT, chronic/remote Lacunar infarcts.     Received incoming return call from outreach call on yesterday.   Patient discussed that she is still having difficulty with neuropathy, she reports being started on gabapentin recently at a low dose and plans to discuss with MD making adjustments in medication.   Patient further discussed: Chronic Back pain/falls Patient discussed having epidural injection done on last week and was feeling better, but she also had a fall afterwards. Patient discussed that she was using her walker at the time of the fall, she was able to assist herself up from the fall, she was wearing her medical alert button.  Patient reports that she did notify [name] office of the fall. She discussed that she has been able to work with therapy since fall, and has home visit planned for today. Again reinforced fall prevention measures.  Nutrition  Patient reports that she continues to work on getting more protein in , but she is not gaining weight, she  states today's weight is 84.  Patient discussed not having much of an appetite at times, no feeling like cooking, but has quick easy foods to get to. Patient declines need for rescheduling nutrition visit, stating she got all the information she needed at visit she just has to continue to work on it. She discussed planning to get protein powder for use in making shakes.  Appointment  Patient cancelled wellness visit reinforced rescheduling.  Follow up discussion on scheduling counselor appointment at [place] , patient states that she will do today.  Nex visit with rheumatology on 3/3.    Plan Will continue to follow patient in care planning goals and progress.  Reviewed fall prevention measures, will send EMMI on getting up from falls.  Will send EMMI on high calorie, protein diet.  Will plan return call in the next month.   [facility]N CM Care Plan Problem One     Most Recent Value  Care Plan Problem One  High risk for readmission related to readmission to  hospital for abdominal pain, constipation,FTT, in additon to recent admission for Takotsubo cardiomyopathy    Role Documenting the Problem One  Care Management [place] for Problem One  Active  [facility] Long Term Goal   Patient will not experience a hospital readmission over the next 90 days  THN Long Term Goal Start Date  08/06/18 [name] updated ]  Interventions for Problem One Long Term Goal  Review of current clinical state, advised keeping all appointments, notifying MD of recurrent falls, for follow up. will send EMMI on getting up from fall. Reinforced nutrition benefits of health , pain  management .   [facility]N CM Short Term Goal #1   Patient will report attending all medical appointments over the next 30 days   THN CM Short Term Goal #1 Start Date  08/06/18  [facility] CM Short Term Goal #1 Met Date  09/09/18  THN CM Short Term Goal #2   Patient will be able to report participation in daily physical therapy exercises over  the next 30 days   THN CM Short Term Goal #2 Start Date  08/06/18  [facility] CM Short Term Goal #2 Met Date  09/09/18  THN CM Short Term Goal #3  Over the next 30 days patient will be able to report increased protein nutriton intake   THN CM Short Term Goal #3 Start Date  10/09/18 [restart date to reach goal ]  Interventions for Short Tern Goal #3  Reviewed recommendations for nutrition, keeping nutrition snacks available, having microwave meals on hand , consider having easy to prepare meals/snacks protein shakes, will send emmin on high protein , calorie meals.   THN CM Short Term Goal #4  Over the next 30 days patient will be able to verbalize 3 measure to prevent constipation.   THN CM Short Term Goal #4 Start Date  08/14/18  [facility]N CM Short Term Goal #4 Met Date  09/09/18  [facility]N CM Short Term Goal #5   Patient will be able to report weight gain of 2 pounds in the next 30 days   THN CM Short Term Goal #5 Start Date  10/09/18  Interventions for Short Term Goal #5  Reviewed current weight reinforced importance of nutriition on health  results . encouraged to continue to track weights at home, discussed considering rescheduling cancelled visit to nutritionist, for support .     THN CM Care Plan Problem Two     Most Recent Value  Care Plan Problem Two  Chronic back pain related to arthritis   Role Documenting the Problem Two  Care Management Coordinator  Care Plan for Problem Two  Active  [facility]N CM Short Term Goal #1   Patient will be able to verbalize improvemnt of back pain over the next 30 days   THN CM Short Term Goal #1 Start Date  10/09/18  Interventions for Short Term Goal #2   Advised regarding notifying MD of worsening of discomfort , keeping all follow up appointments       Joylene Draft, RN, [place] Management Coordinator  [phone]- Mobile [phone]- [place]

## 2018-10-23 ENCOUNTER — Ambulatory Visit: Payer: Self-pay | Admitting: *Deleted

## 2018-10-23 DIAGNOSIS — R296 Repeated falls: Secondary | ICD-10-CM | POA: Diagnosis not present

## 2018-10-23 DIAGNOSIS — M792 Neuralgia and neuritis, unspecified: Secondary | ICD-10-CM | POA: Diagnosis not present

## 2018-10-23 DIAGNOSIS — E538 Deficiency of other specified B group vitamins: Secondary | ICD-10-CM | POA: Diagnosis not present

## 2018-10-23 DIAGNOSIS — E559 Vitamin D deficiency, unspecified: Secondary | ICD-10-CM | POA: Diagnosis not present

## 2018-10-23 DIAGNOSIS — R2689 Other abnormalities of gait and mobility: Secondary | ICD-10-CM | POA: Diagnosis not present

## 2018-10-23 DIAGNOSIS — E43 Unspecified severe protein-calorie malnutrition: Secondary | ICD-10-CM | POA: Diagnosis not present

## 2018-10-23 DIAGNOSIS — G5793 Unspecified mononeuropathy of bilateral lower limbs: Secondary | ICD-10-CM | POA: Diagnosis not present

## 2018-10-23 LAB — CBC AND DIFFERENTIAL
Hemoglobin: 10.6 — AB (ref 12.0–16.0)
WBC: 5.9

## 2018-10-23 LAB — VITAMIN B12: Vitamin B-12: 156

## 2018-10-23 LAB — IRON,TIBC AND FERRITIN PANEL: Ferritin: 22

## 2018-10-23 LAB — HEMOGLOBIN A1C: Hemoglobin A1C: 5.3

## 2018-10-26 DIAGNOSIS — G629 Polyneuropathy, unspecified: Secondary | ICD-10-CM | POA: Diagnosis not present

## 2018-10-26 DIAGNOSIS — M3500 Sicca syndrome, unspecified: Secondary | ICD-10-CM | POA: Diagnosis not present

## 2018-10-26 DIAGNOSIS — M5115 Intervertebral disc disorders with radiculopathy, thoracolumbar region: Secondary | ICD-10-CM | POA: Diagnosis not present

## 2018-10-26 DIAGNOSIS — Z9181 History of falling: Secondary | ICD-10-CM | POA: Diagnosis not present

## 2018-10-26 DIAGNOSIS — Z79891 Long term (current) use of opiate analgesic: Secondary | ICD-10-CM | POA: Diagnosis not present

## 2018-10-26 DIAGNOSIS — M810 Age-related osteoporosis without current pathological fracture: Secondary | ICD-10-CM | POA: Diagnosis not present

## 2018-10-26 DIAGNOSIS — Z7982 Long term (current) use of aspirin: Secondary | ICD-10-CM | POA: Diagnosis not present

## 2018-10-26 DIAGNOSIS — I429 Cardiomyopathy, unspecified: Secondary | ICD-10-CM | POA: Diagnosis not present

## 2018-10-26 DIAGNOSIS — L309 Dermatitis, unspecified: Secondary | ICD-10-CM | POA: Diagnosis not present

## 2018-10-26 DIAGNOSIS — M4185 Other forms of scoliosis, thoracolumbar region: Secondary | ICD-10-CM | POA: Diagnosis not present

## 2018-10-26 DIAGNOSIS — M1990 Unspecified osteoarthritis, unspecified site: Secondary | ICD-10-CM | POA: Diagnosis not present

## 2018-10-26 DIAGNOSIS — K219 Gastro-esophageal reflux disease without esophagitis: Secondary | ICD-10-CM | POA: Diagnosis not present

## 2018-10-26 DIAGNOSIS — I7300 Raynaud's syndrome without gangrene: Secondary | ICD-10-CM | POA: Diagnosis not present

## 2018-10-26 DIAGNOSIS — E785 Hyperlipidemia, unspecified: Secondary | ICD-10-CM | POA: Diagnosis not present

## 2018-10-26 DIAGNOSIS — I10 Essential (primary) hypertension: Secondary | ICD-10-CM | POA: Diagnosis not present

## 2018-10-26 DIAGNOSIS — M069 Rheumatoid arthritis, unspecified: Secondary | ICD-10-CM | POA: Diagnosis not present

## 2018-10-27 DIAGNOSIS — M1990 Unspecified osteoarthritis, unspecified site: Secondary | ICD-10-CM | POA: Diagnosis not present

## 2018-10-27 DIAGNOSIS — Z79891 Long term (current) use of opiate analgesic: Secondary | ICD-10-CM | POA: Diagnosis not present

## 2018-10-27 DIAGNOSIS — I429 Cardiomyopathy, unspecified: Secondary | ICD-10-CM | POA: Diagnosis not present

## 2018-10-27 DIAGNOSIS — M545 Low back pain: Secondary | ICD-10-CM | POA: Diagnosis not present

## 2018-10-27 DIAGNOSIS — Z9181 History of falling: Secondary | ICD-10-CM | POA: Diagnosis not present

## 2018-10-27 DIAGNOSIS — I10 Essential (primary) hypertension: Secondary | ICD-10-CM | POA: Diagnosis not present

## 2018-10-27 DIAGNOSIS — I7300 Raynaud's syndrome without gangrene: Secondary | ICD-10-CM | POA: Diagnosis not present

## 2018-10-27 DIAGNOSIS — M4185 Other forms of scoliosis, thoracolumbar region: Secondary | ICD-10-CM | POA: Diagnosis not present

## 2018-10-27 DIAGNOSIS — G8929 Other chronic pain: Secondary | ICD-10-CM | POA: Diagnosis not present

## 2018-10-27 DIAGNOSIS — G629 Polyneuropathy, unspecified: Secondary | ICD-10-CM | POA: Diagnosis not present

## 2018-10-27 DIAGNOSIS — E785 Hyperlipidemia, unspecified: Secondary | ICD-10-CM | POA: Diagnosis not present

## 2018-10-27 DIAGNOSIS — M5115 Intervertebral disc disorders with radiculopathy, thoracolumbar region: Secondary | ICD-10-CM | POA: Diagnosis not present

## 2018-10-27 DIAGNOSIS — K219 Gastro-esophageal reflux disease without esophagitis: Secondary | ICD-10-CM | POA: Diagnosis not present

## 2018-10-27 DIAGNOSIS — E538 Deficiency of other specified B group vitamins: Secondary | ICD-10-CM | POA: Diagnosis not present

## 2018-10-27 DIAGNOSIS — M810 Age-related osteoporosis without current pathological fracture: Secondary | ICD-10-CM | POA: Diagnosis not present

## 2018-10-27 DIAGNOSIS — L309 Dermatitis, unspecified: Secondary | ICD-10-CM | POA: Diagnosis not present

## 2018-10-27 DIAGNOSIS — Z7982 Long term (current) use of aspirin: Secondary | ICD-10-CM | POA: Diagnosis not present

## 2018-10-27 DIAGNOSIS — M069 Rheumatoid arthritis, unspecified: Secondary | ICD-10-CM | POA: Diagnosis not present

## 2018-10-27 DIAGNOSIS — M3500 Sicca syndrome, unspecified: Secondary | ICD-10-CM | POA: Diagnosis not present

## 2018-10-29 DIAGNOSIS — I10 Essential (primary) hypertension: Secondary | ICD-10-CM | POA: Diagnosis not present

## 2018-10-29 DIAGNOSIS — E785 Hyperlipidemia, unspecified: Secondary | ICD-10-CM | POA: Diagnosis not present

## 2018-10-29 DIAGNOSIS — L309 Dermatitis, unspecified: Secondary | ICD-10-CM | POA: Diagnosis not present

## 2018-10-29 DIAGNOSIS — M3500 Sicca syndrome, unspecified: Secondary | ICD-10-CM | POA: Diagnosis not present

## 2018-10-29 DIAGNOSIS — G629 Polyneuropathy, unspecified: Secondary | ICD-10-CM | POA: Diagnosis not present

## 2018-10-29 DIAGNOSIS — M4185 Other forms of scoliosis, thoracolumbar region: Secondary | ICD-10-CM | POA: Diagnosis not present

## 2018-10-29 DIAGNOSIS — Z7982 Long term (current) use of aspirin: Secondary | ICD-10-CM | POA: Diagnosis not present

## 2018-10-29 DIAGNOSIS — I7300 Raynaud's syndrome without gangrene: Secondary | ICD-10-CM | POA: Diagnosis not present

## 2018-10-29 DIAGNOSIS — Z9181 History of falling: Secondary | ICD-10-CM | POA: Diagnosis not present

## 2018-10-29 DIAGNOSIS — M5115 Intervertebral disc disorders with radiculopathy, thoracolumbar region: Secondary | ICD-10-CM | POA: Diagnosis not present

## 2018-10-29 DIAGNOSIS — M1990 Unspecified osteoarthritis, unspecified site: Secondary | ICD-10-CM | POA: Diagnosis not present

## 2018-10-29 DIAGNOSIS — I429 Cardiomyopathy, unspecified: Secondary | ICD-10-CM | POA: Diagnosis not present

## 2018-10-29 DIAGNOSIS — Z79891 Long term (current) use of opiate analgesic: Secondary | ICD-10-CM | POA: Diagnosis not present

## 2018-10-29 DIAGNOSIS — M069 Rheumatoid arthritis, unspecified: Secondary | ICD-10-CM | POA: Diagnosis not present

## 2018-10-29 DIAGNOSIS — M810 Age-related osteoporosis without current pathological fracture: Secondary | ICD-10-CM | POA: Diagnosis not present

## 2018-10-29 DIAGNOSIS — K219 Gastro-esophageal reflux disease without esophagitis: Secondary | ICD-10-CM | POA: Diagnosis not present

## 2018-10-30 ENCOUNTER — Other Ambulatory Visit: Payer: Self-pay | Admitting: Internal Medicine

## 2018-10-30 DIAGNOSIS — M4185 Other forms of scoliosis, thoracolumbar region: Secondary | ICD-10-CM | POA: Diagnosis not present

## 2018-10-30 DIAGNOSIS — I10 Essential (primary) hypertension: Secondary | ICD-10-CM | POA: Diagnosis not present

## 2018-10-30 DIAGNOSIS — M810 Age-related osteoporosis without current pathological fracture: Secondary | ICD-10-CM | POA: Diagnosis not present

## 2018-10-30 DIAGNOSIS — E785 Hyperlipidemia, unspecified: Secondary | ICD-10-CM | POA: Diagnosis not present

## 2018-10-30 DIAGNOSIS — K219 Gastro-esophageal reflux disease without esophagitis: Secondary | ICD-10-CM | POA: Diagnosis not present

## 2018-10-30 DIAGNOSIS — M069 Rheumatoid arthritis, unspecified: Secondary | ICD-10-CM | POA: Diagnosis not present

## 2018-10-30 DIAGNOSIS — I7300 Raynaud's syndrome without gangrene: Secondary | ICD-10-CM | POA: Diagnosis not present

## 2018-10-30 DIAGNOSIS — G629 Polyneuropathy, unspecified: Secondary | ICD-10-CM | POA: Diagnosis not present

## 2018-10-30 DIAGNOSIS — I429 Cardiomyopathy, unspecified: Secondary | ICD-10-CM | POA: Diagnosis not present

## 2018-10-30 DIAGNOSIS — Z7982 Long term (current) use of aspirin: Secondary | ICD-10-CM | POA: Diagnosis not present

## 2018-10-30 DIAGNOSIS — M3500 Sicca syndrome, unspecified: Secondary | ICD-10-CM | POA: Diagnosis not present

## 2018-10-30 DIAGNOSIS — M5115 Intervertebral disc disorders with radiculopathy, thoracolumbar region: Secondary | ICD-10-CM | POA: Diagnosis not present

## 2018-10-30 DIAGNOSIS — L309 Dermatitis, unspecified: Secondary | ICD-10-CM | POA: Diagnosis not present

## 2018-10-30 DIAGNOSIS — Z9181 History of falling: Secondary | ICD-10-CM | POA: Diagnosis not present

## 2018-10-30 DIAGNOSIS — M1990 Unspecified osteoarthritis, unspecified site: Secondary | ICD-10-CM | POA: Diagnosis not present

## 2018-10-30 DIAGNOSIS — Z79891 Long term (current) use of opiate analgesic: Secondary | ICD-10-CM | POA: Diagnosis not present

## 2018-11-03 ENCOUNTER — Ambulatory Visit (INDEPENDENT_AMBULATORY_CARE_PROVIDER_SITE_OTHER): Payer: Medicare Other | Admitting: Internal Medicine

## 2018-11-03 ENCOUNTER — Other Ambulatory Visit: Payer: Self-pay

## 2018-11-03 ENCOUNTER — Encounter: Payer: Self-pay | Admitting: Internal Medicine

## 2018-11-03 VITALS — BP 98/60 | HR 77 | Ht 59.0 in | Wt 85.0 lb

## 2018-11-03 DIAGNOSIS — E43 Unspecified severe protein-calorie malnutrition: Secondary | ICD-10-CM

## 2018-11-03 DIAGNOSIS — F39 Unspecified mood [affective] disorder: Secondary | ICD-10-CM | POA: Diagnosis not present

## 2018-11-03 DIAGNOSIS — I5032 Chronic diastolic (congestive) heart failure: Secondary | ICD-10-CM

## 2018-11-03 DIAGNOSIS — E538 Deficiency of other specified B group vitamins: Secondary | ICD-10-CM | POA: Diagnosis not present

## 2018-11-03 DIAGNOSIS — M069 Rheumatoid arthritis, unspecified: Secondary | ICD-10-CM

## 2018-11-03 DIAGNOSIS — I5181 Takotsubo syndrome: Secondary | ICD-10-CM

## 2018-11-03 MED ORDER — CYANOCOBALAMIN 1000 MCG/ML IJ SOLN
1000.0000 ug | Freq: Once | INTRAMUSCULAR | Status: AC
  Administered 2018-11-03: 1000 ug via INTRAMUSCULAR

## 2018-11-03 NOTE — Progress Notes (Signed)
Date:  11/03/2018   Name:  Katherine Baird   DOB:  01/25/1951   MRN:  [id number]  Here today with her sister, Katherine Baird, who is POA. Chief Complaint: Weight Loss  Depression         This is a chronic problem.The problem is unchanged.  Associated symptoms include fatigue.  Associated symptoms include no headaches.  Past treatments include other medications.  Compliance with treatment is good.  Previous treatment provided moderate relief. Back Pain  This is a recurrent problem. The problem is unchanged. The pain is present in the lumbar spine. Pertinent negatives include no abdominal pain, chest pain, dysuria, fever or headaches. Treatments tried: seen by Rheum and Neuro - increased dose of gabapentin.  malnutrition - she has not lost past 81 lbs, is trying to eat better, using protein bars and just started Ensure supplements.  She uses a walker for ambulation.  She feels very weak and wonders about PTx. B12 def - with mild anemia noted on recent labs at [facility].  She has been taking oral B12 with no benefit so injections were recommended. Cardiomyopathy - has improved with last EF 55%.  She continues on aspirin and coreg.  She denies chest pain, SOB and edema.  Lab Results  Component Value Date   VITAMINB12 156 10/23/2018   Lab Results  Component Value Date   WBC 5.9 10/23/2018   HGB 10.6 (A) 10/23/2018   HCT 42.7 07/08/2018   MCV 93.6 07/08/2018   PLT 287 07/08/2018   Lab Results  Component Value Date   HGBA1C 5.3 10/23/2018     Review of Systems  Constitutional: Positive for fatigue. Negative for chills and fever.  Respiratory: Negative for cough and chest tightness.   Cardiovascular: Negative for chest pain and leg swelling.  Gastrointestinal: Negative for abdominal pain, diarrhea and nausea.  Genitourinary: Negative for dysuria.  Musculoskeletal: Positive for arthralgias and back pain.  Skin: Negative for rash.  Neurological: Negative for dizziness and headaches.    Psychiatric/Behavioral: Positive for depression. Negative for dysphoric mood and sleep disturbance.    Patient Active Problem List   Diagnosis Date Noted  . Vitamin B12 nutritional deficiency 11/03/2018  . Age-related osteoporosis with current pathological fracture of vertebra with routine healing 09/30/2018  . Personal history of fall 09/30/2018  . HNP (herniated nucleus pulposus), thoracic 09/22/2018  . Paroxysmal atrial fibrillation (HCC) 08/28/2018  . Mood disorder (HCC) 08/10/2018  . Severe protein-calorie malnutrition (HCC) 08/10/2018  . Late effect of lacunar infarction 08/10/2018  . Takotsubo cardiomyopathy 08/10/2018  . Constipation 07/30/2018  . Bladder infection 07/14/2018  . Old myocardial infarction 07/02/2018  . Iron deficiency anemia 02/08/2018  . Fatigue 01/13/2018  . Chronic diastolic heart failure (HCC) 12/17/2017  . 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 Syncope 02/24/2017  . Hypothyroidism 05/24/2016  . HLD (hyperlipidemia) 01/27/2015  . Paroxysmal digital cyanosis 01/27/2015  . Essential (primary) hypertension 01/11/2015  . Rheumatoid arthritis involving multiple joints (HCC) 01/11/2015  . 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 Levofloxacin Nausea And Vomiting  . Doxycycline   . Erythromycin   . Gabapentin   . Guaifenesin     "Felt sick" and worsening of symptoms  . Nitrofuran Derivatives   . Prednisone     muscle weakness/pain  . Sulfa Antibiotics   . Tetracycline   . Azithromycin Rash    Severe rash with peeling  . Carvedilol Other (See Comments)  Dose problem with higher doses    Past Surgical History:  Procedure Laterality Date  . LAPAROSCOPIC HYSTERECTOMY  2001   total  . LEFT HEART CATH N/A 07/03/2018   Procedure: Left Heart Cath with Coronary Angiography;  Surgeon: Marcina Millard, MD;  Location: [facility] INVASIVE CV LAB;  Service: Cardiovascular;  Laterality: N/A;    Social History    Tobacco Use  . Smoking status: Never Smoker  . Smokeless tobacco: Never Used  Substance Use Topics  . Alcohol use: No    Alcohol/week: 0.0 standard drinks  . Drug use: No     Medication list has been reviewed and updated.  Current Meds  Medication Sig  . acetaminophen (TYLENOL) 500 MG tablet Take 500 mg by mouth every 8 (eight) hours as needed for moderate pain.  Marland Kitchen aspirin EC 81 MG tablet Take 81 mg by mouth daily.  . carvedilol (COREG) 6.25 MG tablet Take 6.25 mg by mouth 2 (two) times daily with a meal.   . clobetasol ointment (TEMOVATE) 0.05 % Apply 1 application topically 2 (two) times daily as needed (for irritation).   Marland Kitchen denosumab (PROLIA) 60 MG/ML SOSY injection Inject 60 mg into the skin every 6 (six) months.  . docusate sodium (COLACE) 100 MG capsule Take 100 mg by mouth daily as needed for mild constipation. Taking one to two a day  . Dupilumab 300 MG/2ML SOSY INJECT THE CONTENTS OF 1 SYRINGE (300MG ) EVERY 2 WEEKS  . folic acid (FOLVITE) 1 MG tablet Take 1 mg by mouth daily.  Marland Kitchen gabapentin (NEURONTIN) 100 MG capsule Take 100 mg by mouth 4 (four) times daily.   Marland Kitchen levothyroxine (SYNTHROID, LEVOTHROID) 50 MCG tablet TAKE 1 TABLET BY MOUTH  DAILY  . mirtazapine (REMERON) 15 MG tablet Take 15 mg by mouth at bedtime.  . polyethylene glycol (MIRALAX / GLYCOLAX) packet Take 17 g by mouth daily as needed.   . traMADol (ULTRAM) 50 MG tablet Take 1 tablet (50 mg total) by mouth every 6 (six) hours as needed for moderate pain or severe pain. (Patient taking differently: Take 50 mg by mouth 3 (three) times daily. )    PHQ 2/9 Scores 11/03/2018 09/22/2018 09/03/2018 07/15/2018  PHQ - 2 Score 0 0 0 2  PHQ- 9 Score - - - 9    Physical Exam Vitals signs and nursing note reviewed.  Constitutional:      General: She is not in acute distress.    Appearance: She is cachectic.     Comments: Temporal wasting noted  HENT:     Head: Normocephalic and atraumatic.  Cardiovascular:     Rate  and Rhythm: Normal rate and regular rhythm.     Pulses: Normal pulses.     Heart sounds: No murmur.  Pulmonary:     Effort: Pulmonary effort is normal. No respiratory distress.     Breath sounds: Normal breath sounds and air entry.  Chest:     Breasts:        Right: No mass or nipple discharge.        Left: No mass or nipple discharge.  Abdominal:     General: Abdomen is scaphoid. Bowel sounds are normal.     Palpations: Abdomen is soft.     Tenderness: There is no abdominal tenderness.  Musculoskeletal:        General: Tenderness present.     Right lower leg: No edema.     Left lower leg: No edema.  Skin:  General: Skin is warm and dry.     Findings: Erythema and rash present. Rash is scaling.     Comments: Chronic dry skin of face noted  Neurological:     Mental Status: She is alert and oriented to person, place, and time.  Psychiatric:        Behavior: Behavior normal.        Thought Content: Thought content normal.     Wt Readings from Last 3 Encounters:  11/03/18 85 lb (38.6 kg)  09/22/18 81 lb 6.4 oz (36.9 kg)  09/03/18 86 lb 1.6 oz (39.1 kg)    BP 98/60   Pulse 77   Ht 4\' 11"  (1.499 m)   Wt 85 lb (38.6 kg)   SpO2 99%   BMI 17.17 kg/m   Assessment and Plan: 1. Severe protein-calorie malnutrition (HCC) Continue multiple small meals per day Ensure or similar at least twice a day between meals Monitor weight at home Follow up in 2 months  2. Vitamin B12 nutritional deficiency Begin weekly injections x 4 then monthly - cyanocobalamin ((VITAMIN B-12)) injection 1,000 mcg  3. Rheumatoid arthritis involving multiple joints (HCC) Seen by Rheum Back improved with higher dose gabapentin  4. Mood disorder (HCC) Improved; continue Remeron  5. Chronic diastolic heart failure (HCC) Last EF improved Symptoms stabilized on Coreg  6. Takotsubo cardiomyopathy Follow up with Cardiology   Partially dictated using Dragon software. Any errors are  unintentional.  Bari EdwardLaura Neilson Oehlert, MD [facility]  11/03/2018

## 2018-11-04 DIAGNOSIS — I10 Essential (primary) hypertension: Secondary | ICD-10-CM | POA: Diagnosis not present

## 2018-11-04 DIAGNOSIS — I429 Cardiomyopathy, unspecified: Secondary | ICD-10-CM | POA: Diagnosis not present

## 2018-11-04 DIAGNOSIS — I7300 Raynaud's syndrome without gangrene: Secondary | ICD-10-CM | POA: Diagnosis not present

## 2018-11-04 DIAGNOSIS — M810 Age-related osteoporosis without current pathological fracture: Secondary | ICD-10-CM | POA: Diagnosis not present

## 2018-11-04 DIAGNOSIS — M1990 Unspecified osteoarthritis, unspecified site: Secondary | ICD-10-CM | POA: Diagnosis not present

## 2018-11-04 DIAGNOSIS — M3500 Sicca syndrome, unspecified: Secondary | ICD-10-CM | POA: Diagnosis not present

## 2018-11-04 DIAGNOSIS — G629 Polyneuropathy, unspecified: Secondary | ICD-10-CM | POA: Diagnosis not present

## 2018-11-04 DIAGNOSIS — Z9181 History of falling: Secondary | ICD-10-CM | POA: Diagnosis not present

## 2018-11-04 DIAGNOSIS — K219 Gastro-esophageal reflux disease without esophagitis: Secondary | ICD-10-CM | POA: Diagnosis not present

## 2018-11-04 DIAGNOSIS — Z79891 Long term (current) use of opiate analgesic: Secondary | ICD-10-CM | POA: Diagnosis not present

## 2018-11-04 DIAGNOSIS — Z7982 Long term (current) use of aspirin: Secondary | ICD-10-CM | POA: Diagnosis not present

## 2018-11-04 DIAGNOSIS — E785 Hyperlipidemia, unspecified: Secondary | ICD-10-CM | POA: Diagnosis not present

## 2018-11-04 DIAGNOSIS — M5115 Intervertebral disc disorders with radiculopathy, thoracolumbar region: Secondary | ICD-10-CM | POA: Diagnosis not present

## 2018-11-04 DIAGNOSIS — L309 Dermatitis, unspecified: Secondary | ICD-10-CM | POA: Diagnosis not present

## 2018-11-04 DIAGNOSIS — M069 Rheumatoid arthritis, unspecified: Secondary | ICD-10-CM | POA: Diagnosis not present

## 2018-11-04 DIAGNOSIS — M4185 Other forms of scoliosis, thoracolumbar region: Secondary | ICD-10-CM | POA: Diagnosis not present

## 2018-11-05 ENCOUNTER — Other Ambulatory Visit: Payer: Self-pay

## 2018-11-05 ENCOUNTER — Other Ambulatory Visit: Payer: Self-pay | Admitting: *Deleted

## 2018-11-05 DIAGNOSIS — Z79891 Long term (current) use of opiate analgesic: Secondary | ICD-10-CM | POA: Diagnosis not present

## 2018-11-05 DIAGNOSIS — Z7982 Long term (current) use of aspirin: Secondary | ICD-10-CM | POA: Diagnosis not present

## 2018-11-05 DIAGNOSIS — K219 Gastro-esophageal reflux disease without esophagitis: Secondary | ICD-10-CM | POA: Diagnosis not present

## 2018-11-05 DIAGNOSIS — G629 Polyneuropathy, unspecified: Secondary | ICD-10-CM | POA: Diagnosis not present

## 2018-11-05 DIAGNOSIS — I429 Cardiomyopathy, unspecified: Secondary | ICD-10-CM | POA: Diagnosis not present

## 2018-11-05 DIAGNOSIS — M4185 Other forms of scoliosis, thoracolumbar region: Secondary | ICD-10-CM | POA: Diagnosis not present

## 2018-11-05 DIAGNOSIS — I10 Essential (primary) hypertension: Secondary | ICD-10-CM | POA: Diagnosis not present

## 2018-11-05 DIAGNOSIS — I7300 Raynaud's syndrome without gangrene: Secondary | ICD-10-CM | POA: Diagnosis not present

## 2018-11-05 DIAGNOSIS — M3500 Sicca syndrome, unspecified: Secondary | ICD-10-CM | POA: Diagnosis not present

## 2018-11-05 DIAGNOSIS — M1990 Unspecified osteoarthritis, unspecified site: Secondary | ICD-10-CM | POA: Diagnosis not present

## 2018-11-05 DIAGNOSIS — E785 Hyperlipidemia, unspecified: Secondary | ICD-10-CM | POA: Diagnosis not present

## 2018-11-05 DIAGNOSIS — Z9181 History of falling: Secondary | ICD-10-CM | POA: Diagnosis not present

## 2018-11-05 DIAGNOSIS — M5115 Intervertebral disc disorders with radiculopathy, thoracolumbar region: Secondary | ICD-10-CM | POA: Diagnosis not present

## 2018-11-05 DIAGNOSIS — M810 Age-related osteoporosis without current pathological fracture: Secondary | ICD-10-CM | POA: Diagnosis not present

## 2018-11-05 DIAGNOSIS — M069 Rheumatoid arthritis, unspecified: Secondary | ICD-10-CM | POA: Diagnosis not present

## 2018-11-05 DIAGNOSIS — L309 Dermatitis, unspecified: Secondary | ICD-10-CM | POA: Diagnosis not present

## 2018-11-05 NOTE — Patient Outreach (Signed)
[facility]) Care Management  11/05/2018  Katherine Baird 24-Jun-1951 [id number]  Telephone follow up call    PMHx noted : significant for Hypertension , Diastolic Heart failure, recent UTI , back pain,iron deficiency , osteoporosis  ED visits 11/3 Dx: Chest pain  ED visit 11/22 at [facility] ED Dx : Pyelonephritis , back pain  ED visit 11/24 at [facility] ED Dx: Abdominal pain, Constipation. Referral to [facility] spine center regarding T4 compression fracture, await phone call within a week regarding scheduling an appointment . Admission to [facility]- 12/10 Dx: Vomiting intractability, abdominal pain, dehydration, constipation, FTT, concern for depressive illness  chronic/remote Lacunar infarcts.  Successful outreach call to patient, she reports feeling a fairly good.  She reports recent visit PCP.   She further discussed :  Falls risk/Falls  Denies recent falls, continues to be followed by home health physical therapy.  She report using her walker and has a grabber for use .  Nutrition/weight loss  Patient reports her recent weight is 85, reports no decrease in weight.  Patient discussed drinking protein drink twice a day between meals, states family being very supportive and encouraging her regarding nutrition and focusing on protein.  Discussed with patient follow up with nutritionist and benefits to help with improving nutrition   she again states that she has all the information that she needs and does not feel it will benefit her. Chronic Back pain  Reports some improvements with pain since on increased gabapentin, Patient has followed up with neurology, rheumatoid appointments.  Mental Health  Patient states that she has followed up with [facility] health and no appointments available she now states that she may need something closer. Discussed [name] [facility] care insurance benefits may have a telephonic program for behavorial health, patient  is agreeable if appropriate.  Patient sister Katherine Baird is visiting patient on this week, patient offered for me to her. Katherine Baird discussed concerns of  Social Plans for patient to move to her independent living apartment at [facility] in [place],  in the next month but she is concerned that patient may still need someone to check on her during the day , help with ADl's patient is not safe to shower without assistance due to fall risk and general weakness.  She discussed that patient has the financial resources to pay to help and would be agreeable .  Family is making sure that there mother that has Alzheimers is taken care of , as well as working on jobs daily,  patient still stays at her mom's home during the night so does a family member because patient would not be able to assist the mother.  Discussed behavorial health follow up, Katherine Baird is concerned that patient has scheduled and not kept appointments at [place] she discussed that if telephone program was not available  maybe getting counselor in the [place] area would be a next option, explained patient agreeable to attempting telephonic program through [facility] is still available and patient eligible.   Assessment  Patient will benefit from ongoing complex case management, for care coordination, Education and support of medical conditions.   Plan  Will look into [facility] behavorial health telephonic resources and complete referral form and follow up with patient.  Will place [facility] social worker consult for resources on personal care assistance.  Will plan follow up call to patient in the next 2 weeks.   [facility] CM Care Plan Problem One     Most  Recent Value  Care Plan Problem One  High risk for readmission related to malnutrition , high fall risk   Role Documenting the Problem One  Care Management [place] for Problem One  Active  [facility]N Long Term Goal   Patient will not experience a hospital readmission over the next  30 days  THN Long Term Goal Start Date  11/05/18  [facility] Long Term Goal Met Date  11/05/18  Interventions for Problem One Long Term Goal  Discussed current clinical state reinforced importance of taking medicaitons, notiifying MD of new or worsen concerns for pain , falls   THN CM Short Term Goal #1   Patient will not experience a fall in the next 30 days   THN CM Short Term Goal #1 Start Date  11/05/18  [facility] CM Short Term Goal #1 Met Date  09/09/18  Interventions for Short Term Goal #1  Reinforced using walker at all times, avoid bending over use grabber, continue to participate in home therapy , review with how to get up from a fall and to notify MD of falls with injury or increase in falls   THN CM Short Term Goal #2   Patient will be able to report weight gain of 1 pound over the next 30 days   THN CM Short Term Goal #2 Start Date  11/05/18  [facility] CM Short Term Goal #2 Met Date  09/09/18  Interventions for Short Term Goal #2  discussed benefiit of nutritionist follow up for reinforcment of educaiton , encouraged weighing at least 3 days a week and keep a record, discussed best time of day to weigh in the morning , but getting dressed for most accurate weights .   THN CM Short Term Goal #3  Over the next 30 days patient will be able to report continuing to drink protien drink twice daily between meals   THN CM Short Term Goal #3 Start Date  11/05/18  Interventions for Short Tern Goal #3  REviewed importance of protein in diet , review of emmi on protein sources. . stressed to use protein drink as a supplement instead of a meal   THN CM Short Term Goal #4  Over the next 30 days patient will be able to verbalize 3 measure to prevent constipation.   THN CM Short Term Goal #4 Start Date  08/14/18  [facility]N CM Short Term Goal #4 Met Date  09/09/18  THN CM Short Term Goal #5   Patient will be able to report weight gain of 1 pounds in the next 30 days   THN CM Short Term Goal #5 Start Date  11/05/18   Interventions for Short Term Goal #5  Advised regarding benefit of nutritionist follow up the help with improved nutrition and to avoid weight loss , reviewd current weight and nutrition intake, reviewed current nutrition intake and suggestions of food with increased protein .     THN CM Care Plan Problem Two     Most Recent Value  Care Plan Problem Two  Chronic back pain related to arthritis   Role Documenting the Problem Two  Care Management Coordinator  Care Plan for Problem Two  Active  THN CM Short Term Goal #1   Patient will be able to verbalize improvemnt of back pain over the next 30 days   THN CM Short Term Goal #1 Start Date  10/09/18  [facility] CM Short Term Goal #1 Met Date   11/05/18  Joylene Draft, RN, [place] Management Coordinator  [phone]- Mobile [phone]- Toll Free Main Office

## 2018-11-06 DIAGNOSIS — M5115 Intervertebral disc disorders with radiculopathy, thoracolumbar region: Secondary | ICD-10-CM | POA: Diagnosis not present

## 2018-11-06 DIAGNOSIS — Z79891 Long term (current) use of opiate analgesic: Secondary | ICD-10-CM | POA: Diagnosis not present

## 2018-11-06 DIAGNOSIS — Z9181 History of falling: Secondary | ICD-10-CM | POA: Diagnosis not present

## 2018-11-06 DIAGNOSIS — M1990 Unspecified osteoarthritis, unspecified site: Secondary | ICD-10-CM | POA: Diagnosis not present

## 2018-11-06 DIAGNOSIS — K219 Gastro-esophageal reflux disease without esophagitis: Secondary | ICD-10-CM | POA: Diagnosis not present

## 2018-11-06 DIAGNOSIS — M810 Age-related osteoporosis without current pathological fracture: Secondary | ICD-10-CM | POA: Diagnosis not present

## 2018-11-06 DIAGNOSIS — I10 Essential (primary) hypertension: Secondary | ICD-10-CM | POA: Diagnosis not present

## 2018-11-06 DIAGNOSIS — M4185 Other forms of scoliosis, thoracolumbar region: Secondary | ICD-10-CM | POA: Diagnosis not present

## 2018-11-06 DIAGNOSIS — I7300 Raynaud's syndrome without gangrene: Secondary | ICD-10-CM | POA: Diagnosis not present

## 2018-11-06 DIAGNOSIS — I429 Cardiomyopathy, unspecified: Secondary | ICD-10-CM | POA: Diagnosis not present

## 2018-11-06 DIAGNOSIS — Z7982 Long term (current) use of aspirin: Secondary | ICD-10-CM | POA: Diagnosis not present

## 2018-11-06 DIAGNOSIS — G629 Polyneuropathy, unspecified: Secondary | ICD-10-CM | POA: Diagnosis not present

## 2018-11-06 DIAGNOSIS — E785 Hyperlipidemia, unspecified: Secondary | ICD-10-CM | POA: Diagnosis not present

## 2018-11-06 DIAGNOSIS — M3500 Sicca syndrome, unspecified: Secondary | ICD-10-CM | POA: Diagnosis not present

## 2018-11-06 DIAGNOSIS — M069 Rheumatoid arthritis, unspecified: Secondary | ICD-10-CM | POA: Diagnosis not present

## 2018-11-06 DIAGNOSIS — L309 Dermatitis, unspecified: Secondary | ICD-10-CM | POA: Diagnosis not present

## 2018-11-10 ENCOUNTER — Ambulatory Visit (INDEPENDENT_AMBULATORY_CARE_PROVIDER_SITE_OTHER): Payer: Medicare Other

## 2018-11-10 ENCOUNTER — Other Ambulatory Visit: Payer: Self-pay

## 2018-11-10 DIAGNOSIS — E538 Deficiency of other specified B group vitamins: Secondary | ICD-10-CM | POA: Diagnosis not present

## 2018-11-10 MED ORDER — CYANOCOBALAMIN 1000 MCG/ML IJ SOLN
1000.0000 ug | INTRAMUSCULAR | Status: AC
  Administered 2018-11-10 – 2018-11-27 (×2): 1000 ug via INTRAMUSCULAR

## 2018-11-11 DIAGNOSIS — I7300 Raynaud's syndrome without gangrene: Secondary | ICD-10-CM | POA: Diagnosis not present

## 2018-11-11 DIAGNOSIS — M3500 Sicca syndrome, unspecified: Secondary | ICD-10-CM | POA: Diagnosis not present

## 2018-11-11 DIAGNOSIS — I429 Cardiomyopathy, unspecified: Secondary | ICD-10-CM | POA: Diagnosis not present

## 2018-11-11 DIAGNOSIS — M1990 Unspecified osteoarthritis, unspecified site: Secondary | ICD-10-CM | POA: Diagnosis not present

## 2018-11-11 DIAGNOSIS — E785 Hyperlipidemia, unspecified: Secondary | ICD-10-CM | POA: Diagnosis not present

## 2018-11-11 DIAGNOSIS — M5126 Other intervertebral disc displacement, lumbar region: Secondary | ICD-10-CM | POA: Diagnosis not present

## 2018-11-11 DIAGNOSIS — M5416 Radiculopathy, lumbar region: Secondary | ICD-10-CM | POA: Diagnosis not present

## 2018-11-11 DIAGNOSIS — L309 Dermatitis, unspecified: Secondary | ICD-10-CM | POA: Diagnosis not present

## 2018-11-11 DIAGNOSIS — Z9181 History of falling: Secondary | ICD-10-CM | POA: Diagnosis not present

## 2018-11-11 DIAGNOSIS — G629 Polyneuropathy, unspecified: Secondary | ICD-10-CM | POA: Diagnosis not present

## 2018-11-11 DIAGNOSIS — Z79891 Long term (current) use of opiate analgesic: Secondary | ICD-10-CM | POA: Diagnosis not present

## 2018-11-11 DIAGNOSIS — M4185 Other forms of scoliosis, thoracolumbar region: Secondary | ICD-10-CM | POA: Diagnosis not present

## 2018-11-11 DIAGNOSIS — K219 Gastro-esophageal reflux disease without esophagitis: Secondary | ICD-10-CM | POA: Diagnosis not present

## 2018-11-11 DIAGNOSIS — M810 Age-related osteoporosis without current pathological fracture: Secondary | ICD-10-CM | POA: Diagnosis not present

## 2018-11-11 DIAGNOSIS — Z7982 Long term (current) use of aspirin: Secondary | ICD-10-CM | POA: Diagnosis not present

## 2018-11-11 DIAGNOSIS — I10 Essential (primary) hypertension: Secondary | ICD-10-CM | POA: Diagnosis not present

## 2018-11-11 DIAGNOSIS — M069 Rheumatoid arthritis, unspecified: Secondary | ICD-10-CM | POA: Diagnosis not present

## 2018-11-11 DIAGNOSIS — M5115 Intervertebral disc disorders with radiculopathy, thoracolumbar region: Secondary | ICD-10-CM | POA: Diagnosis not present

## 2018-11-13 ENCOUNTER — Other Ambulatory Visit (INDEPENDENT_AMBULATORY_CARE_PROVIDER_SITE_OTHER): Payer: Medicare Other | Admitting: Internal Medicine

## 2018-11-13 ENCOUNTER — Telehealth: Payer: Self-pay | Admitting: Internal Medicine

## 2018-11-13 DIAGNOSIS — M069 Rheumatoid arthritis, unspecified: Secondary | ICD-10-CM

## 2018-11-13 DIAGNOSIS — I5032 Chronic diastolic (congestive) heart failure: Secondary | ICD-10-CM

## 2018-11-13 DIAGNOSIS — I252 Old myocardial infarction: Secondary | ICD-10-CM

## 2018-11-13 DIAGNOSIS — K5901 Slow transit constipation: Secondary | ICD-10-CM

## 2018-11-13 DIAGNOSIS — E034 Atrophy of thyroid (acquired): Secondary | ICD-10-CM

## 2018-11-13 DIAGNOSIS — I480 Paroxysmal atrial fibrillation: Secondary | ICD-10-CM

## 2018-11-13 DIAGNOSIS — D509 Iron deficiency anemia, unspecified: Secondary | ICD-10-CM

## 2018-11-13 DIAGNOSIS — I7300 Raynaud's syndrome without gangrene: Secondary | ICD-10-CM

## 2018-11-13 NOTE — Progress Notes (Signed)
Received orders from [facility]. Start of care 07/07/18. Re-certification dates 09/05/18 through 11/03/18. Orders are reviewed, signed and faxed.

## 2018-11-14 NOTE — Telephone Encounter (Signed)
Error

## 2018-11-16 ENCOUNTER — Other Ambulatory Visit: Payer: Self-pay | Admitting: *Deleted

## 2018-11-16 DIAGNOSIS — K219 Gastro-esophageal reflux disease without esophagitis: Secondary | ICD-10-CM | POA: Diagnosis not present

## 2018-11-16 DIAGNOSIS — L309 Dermatitis, unspecified: Secondary | ICD-10-CM | POA: Diagnosis not present

## 2018-11-16 DIAGNOSIS — M810 Age-related osteoporosis without current pathological fracture: Secondary | ICD-10-CM | POA: Diagnosis not present

## 2018-11-16 DIAGNOSIS — M5115 Intervertebral disc disorders with radiculopathy, thoracolumbar region: Secondary | ICD-10-CM | POA: Diagnosis not present

## 2018-11-16 DIAGNOSIS — I10 Essential (primary) hypertension: Secondary | ICD-10-CM | POA: Diagnosis not present

## 2018-11-16 DIAGNOSIS — I7300 Raynaud's syndrome without gangrene: Secondary | ICD-10-CM | POA: Diagnosis not present

## 2018-11-16 DIAGNOSIS — Z9181 History of falling: Secondary | ICD-10-CM | POA: Diagnosis not present

## 2018-11-16 DIAGNOSIS — Z79891 Long term (current) use of opiate analgesic: Secondary | ICD-10-CM | POA: Diagnosis not present

## 2018-11-16 DIAGNOSIS — M4185 Other forms of scoliosis, thoracolumbar region: Secondary | ICD-10-CM | POA: Diagnosis not present

## 2018-11-16 DIAGNOSIS — M3500 Sicca syndrome, unspecified: Secondary | ICD-10-CM | POA: Diagnosis not present

## 2018-11-16 DIAGNOSIS — G629 Polyneuropathy, unspecified: Secondary | ICD-10-CM | POA: Diagnosis not present

## 2018-11-16 DIAGNOSIS — M1990 Unspecified osteoarthritis, unspecified site: Secondary | ICD-10-CM | POA: Diagnosis not present

## 2018-11-16 DIAGNOSIS — M069 Rheumatoid arthritis, unspecified: Secondary | ICD-10-CM | POA: Diagnosis not present

## 2018-11-16 DIAGNOSIS — I429 Cardiomyopathy, unspecified: Secondary | ICD-10-CM | POA: Diagnosis not present

## 2018-11-16 DIAGNOSIS — E785 Hyperlipidemia, unspecified: Secondary | ICD-10-CM | POA: Diagnosis not present

## 2018-11-16 DIAGNOSIS — Z7982 Long term (current) use of aspirin: Secondary | ICD-10-CM | POA: Diagnosis not present

## 2018-11-16 NOTE — Patient Outreach (Addendum)
[facility]) Care Management  11/16/2018  Katherine Baird 05/04/1951 [id number]   Telephone follow up call   PMHx noted : significant for Hypertension , Diastolic Heart failure, recent UTI , back pain,iron deficiency , osteoporosis  ED visits 11/3 Dx: Chest pain  ED visit 11/22 at [facility] ED Dx : Pyelonephritis , back pain  ED visit 11/24 at [facility] ED Dx: Abdominal pain, Constipation. Referral to [facility] spine center regarding T4 compression fracture, await phone call within a week regarding scheduling an appointment . Admission to [facility] 12/4- 12/10 Dx: Vomiting intractability, abdominal pain, dehydration, constipation, FTT, concern for depressive illness  chronic/remote Lacunar infarcts.  Noted received  test message on Friday, 3/20, after 6:30 PM    from patient sister Katherine Baird, Katherine Baird, requesting  to return call on Monday.  Placed call to Katherine Baird , she discussed concerns relating to care of  Patient :  Katherine Baird discussed Pam continues to stay at her moms home during the night,she has done this for the past few years since her husband passed. Due to patient declined condition she is not able to assist her mom any, so another sibling stays at night also. Katherine Baird discussed that her Mom alzheimer's condition it stresses the mom to have the home health services for patient coming in. Patient usually goes to her home during the day, transportation by family/friend.   Katherine Baird discussed difficulty patient has managing her medications , and making sure that she eats, family is assisting with this now, she expressed idea of patient having a short stay in rehab for intense therapy and daily care. Sister discussed having a heart to heart with patient on last week.  She also discussed patient moving  to her independent care apartment,with hired help to assist her with bathing, meals. She reports that the independent setting does have some safety measures in place,  nurse in place 24 hours a day, dining services. She discussed she believes that patient would be willing to go but is afraid. Family has already begun to move furniture items into apartments   [place]  Patient continues to have falls about once a week. She discussed patient limits in being able to provide to shower independently.   Care Coordination call to [facility] home health able to speak with Katherine Baird, care manager following patient that does supervisory visits. He states that he also received a phone call from patient sister Katherine Baird, regarding patient level of care needs,. Discussed patient functional deficits, psychological state, unsure if patient has skilled care need. He discussed patient continues with home health PT twice weekly and he has supervisory visit in the next 1 to 2 weeks.      Plan Will discuss  with [facility] LCSW Katherine Baird, regarding care options,  resources for personal care and support . Will plan follow up call to patient in the next 3 business days to discuss her care concerns .   [name], RN, [facility] Care Management,Care Management Coordinator  [phone]- Mobile [phone]- Toll Free Main Office

## 2018-11-16 NOTE — Patient Outreach (Signed)
[facility]) Care Management  11/16/2018  Katherine Baird [birth year] [id number]   CSW was able to make initial contact with pt's sister/HCPOA, [name], today by phone. CSW confirmed pt's identity and introduced self and reason for call. Per [name], pt has been staying with their mother who is in her 49's and has Alzheimer's.  Family feels that the pt needs to move out of mother's home and move into an apartment which they have arranged at "[facility]  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   After a lengthy conversation with pt's sister/HCPOA, CSW made suggestions for them to help in expediting pt's needs to include: Family planning to meet with an Attorney to get pt's legal matters in place and will inquire about Guardianship as well.  Communicate with [facility] apartments where pt has a place and determine what steps to take to complete the move; as well ask about private duty caregivers they may have to suggest for in home (Apartment) care for pt.   Pt has a history, per sister, of mental health challenges. She reports she was at [facility] and opted not to follow up with arranged outpatient support.  CSW discussed with pt's sister/HCPOA the steps to take if they feel she is a danger to self or others.   Pt is not eligible for Medicaid and per sister the pt can pay privately for her care.  Getting Durable POA and/or Guardianship completed will help family to be able to manage things for pt.   At this time, CSW plans to attempt to outreach pt for further assessment and to follow up with sister/HCPOA for further assistance and support.   CSW updated [facility] RNCM of above.    [name], MSW, LCSW Clinical Social Worker  [facility] [employer] [phone]

## 2018-11-17 ENCOUNTER — Other Ambulatory Visit: Payer: Self-pay | Admitting: *Deleted

## 2018-11-17 DIAGNOSIS — I7300 Raynaud's syndrome without gangrene: Secondary | ICD-10-CM | POA: Diagnosis not present

## 2018-11-17 DIAGNOSIS — M3500 Sicca syndrome, unspecified: Secondary | ICD-10-CM | POA: Diagnosis not present

## 2018-11-17 DIAGNOSIS — G629 Polyneuropathy, unspecified: Secondary | ICD-10-CM | POA: Diagnosis not present

## 2018-11-17 DIAGNOSIS — M5115 Intervertebral disc disorders with radiculopathy, thoracolumbar region: Secondary | ICD-10-CM | POA: Diagnosis not present

## 2018-11-17 DIAGNOSIS — I429 Cardiomyopathy, unspecified: Secondary | ICD-10-CM | POA: Diagnosis not present

## 2018-11-17 DIAGNOSIS — M069 Rheumatoid arthritis, unspecified: Secondary | ICD-10-CM | POA: Diagnosis not present

## 2018-11-17 DIAGNOSIS — E785 Hyperlipidemia, unspecified: Secondary | ICD-10-CM | POA: Diagnosis not present

## 2018-11-17 DIAGNOSIS — M810 Age-related osteoporosis without current pathological fracture: Secondary | ICD-10-CM | POA: Diagnosis not present

## 2018-11-17 DIAGNOSIS — I10 Essential (primary) hypertension: Secondary | ICD-10-CM | POA: Diagnosis not present

## 2018-11-17 DIAGNOSIS — M4185 Other forms of scoliosis, thoracolumbar region: Secondary | ICD-10-CM | POA: Diagnosis not present

## 2018-11-17 DIAGNOSIS — M1990 Unspecified osteoarthritis, unspecified site: Secondary | ICD-10-CM | POA: Diagnosis not present

## 2018-11-17 DIAGNOSIS — L309 Dermatitis, unspecified: Secondary | ICD-10-CM | POA: Diagnosis not present

## 2018-11-17 DIAGNOSIS — K219 Gastro-esophageal reflux disease without esophagitis: Secondary | ICD-10-CM | POA: Diagnosis not present

## 2018-11-17 NOTE — Patient Outreach (Signed)
[facility]) Care Management  11/17/2018  Naja Ruge 1951-03-01 [id number]  Telephone outreach follow up   PMHx noted : significant for Hypertension , Diastolic Heart failure, recent UTI , back pain,iron deficiency , osteoporosis  ED visits 11/3 Dx: Chest pain  ED visit 11/22 at [facility] ED Dx : Pyelonephritis , back pain  ED visit 11/24 at [facility] ED Dx: Abdominal pain, Constipation. Referral to [facility] spine center regarding T4 compression fracture, await phone call within a week regarding scheduling an appointment . Admission to [facility] 12/4- 12/10 Dx: Vomiting intractability, abdominal pain, dehydration, constipation, FTT, concern for depressive illness  chronic/remote Lacunar infarcts  Successful outreach call to patient , she discussed feeling fair today, remarked that it has been a busy day. Patient left her mom's home this morning to return to her own home where she had home physical therapy visit.  Patient discussed that she just recently returned back to her mom's home.   Patient further discussed : Fall/safety Patient report having recent fall in the last week, reports that she was using her walker at time, she did not hit her head fell onto her bottom.  Patient states that she uses her walker at all times now. Patient reports that while she was at home today, she as able to do some laundry just being very careful.  Patient continues to be active with home health therapy .  Pain  Patient reports improved pain control has improved  with gabapentin and tramadol. Patient states that she is almost out of tramadol and has called Dr. Sherryll Burger Neurology for refill.  Patient discussed following up with neurology and physical medicine and rehab, reports some improvement with back pain after epidural injections at Dr .Yves Dill office. Patient discussed over the last 2 months she is beginning to get a little relief but discussed pain really set her back for a  while .   MalNutrition Patient reports that she is eating better has a appetite . She discussed continuing to drinking boost daily , she was drinking 4 daily now she drinking 2 a day ,due thought that it may be contributing to constipation that is a chronic problem for her. Discussed measures for constipation prevention, continuing to take miralax, drinking adequate water daily 6 to 8 glasses a day eating fruits vegetables .  Patient reports that her weight is still about 85 lbs the last time she weighed , reports that he scales does not always work right , she will ask family to check it, she reports she does have difficulty balancing on the scales. Advised having someone nearby when she steps on the scales.  Discussed recent MD recommendations for nutrition counseling patient continues to decline need, emphasized with patient  benefits of all support layers to help with improving her nutrition health. Patient discussed low vitamin B12 and receiving injection at office x 4 weeks, had visit on last week., states she did not have a visit on this week , patient states that she will call the office.  Mental health Referral has been sent to [facility] telephonic behavorial health program , patient states that she has not had follow up yet. Patient discussed current concerns regarding current virus situation and states that telephone visit may be a good option for her .   Will follow up .  Social Discussed with patient call from her sister regarding her current care concerns and progress.  Patient discussed having independent  apartment at senior living at [facility] ,  she report her family is helping with getting furniture in place . Discussed with patient her thoughts on moving forward with plan to move to apartment that she has been holding for the at least a month. Patient discussed  senior apartments programs, dining rooms ( meals currently served in rooms)  transportation,  Patient discussed she would need  someone to help when she decides to move and would be able to hire some help, her encouraged her to discuss her desires with family and that is currently supporting in care.   Plan Will plan follow up call to patient/sister in the next week and continue communication with [facility] LCSW.  Will follow up with patient/sister HCPOA in the next 2  days  to verify she was able to get refill  on her tramadol and vitamin B12 injection appointment.   Will plan follow up [facility] for [facility] behav[facility] health program eligibility.  Will send PCP telephone visit note , quarterly update .   THN CM Care Plan Problem One     Most Recent Value  Care Plan Problem One  High risk for readmission related to malnutrition , high fall risk   Role Documenting the Problem One  Care Management Coordinator  Care Plan for Problem One  Active  THN Long Term Goal   Patient will not experience a hospital readmission over the next 31 days  THN Long Term Goal Start Date  11/05/18  Interventions for Problem One Long Term Goal  Reviewed current clinical state, will follow with HCPOA regarding patient concerns , folllow on mental health, medication refill and vitamin B12.   THN CM Short Term Goal #1   Patient will not experience a fall in the next 30 days   THN CM Short Term Goal #1 Start Date  11/05/18  Interventions for Short Term Goal #1  Reinforced fall prevention encouraged to notify MD of recurrent falls    THN CM Short Term Goal #2   Patient will be able to report weight gain of 1 pound over the next 30 days   THN CM Short Term Goal #2 Start Date  11/05/18  Interventions for Short Term Goal #2  Advised regarding continued reinforcment and encouragement of nutrition , eating frequent small meals, discussed food likes, and benefits of protein in diet for muscle strength and energy   THN CM Short Term Goal #3  Over the next 30 days patient will be able to report continuing to drink protien drink twice daily  between meals   THN CM Short Term Goal #3 Start Date  11/05/18  Interventions for Short Tern Goal #3  reinforced continued use of protein drink, reviewed measures to prevent constipation      Egbert GaribaldiKimberly Glover, RN, [facility] Care Management,Care Management Coordinator  [phone]- Mobile [phone]- Toll Free Main Office

## 2018-11-18 ENCOUNTER — Other Ambulatory Visit: Payer: Self-pay | Admitting: *Deleted

## 2018-11-18 DIAGNOSIS — M3500 Sicca syndrome, unspecified: Secondary | ICD-10-CM | POA: Diagnosis not present

## 2018-11-18 DIAGNOSIS — I7300 Raynaud's syndrome without gangrene: Secondary | ICD-10-CM | POA: Diagnosis not present

## 2018-11-18 DIAGNOSIS — M810 Age-related osteoporosis without current pathological fracture: Secondary | ICD-10-CM | POA: Diagnosis not present

## 2018-11-18 DIAGNOSIS — K219 Gastro-esophageal reflux disease without esophagitis: Secondary | ICD-10-CM | POA: Diagnosis not present

## 2018-11-18 DIAGNOSIS — M4185 Other forms of scoliosis, thoracolumbar region: Secondary | ICD-10-CM | POA: Diagnosis not present

## 2018-11-18 DIAGNOSIS — I429 Cardiomyopathy, unspecified: Secondary | ICD-10-CM | POA: Diagnosis not present

## 2018-11-18 DIAGNOSIS — M5115 Intervertebral disc disorders with radiculopathy, thoracolumbar region: Secondary | ICD-10-CM | POA: Diagnosis not present

## 2018-11-18 DIAGNOSIS — E785 Hyperlipidemia, unspecified: Secondary | ICD-10-CM | POA: Diagnosis not present

## 2018-11-18 DIAGNOSIS — L309 Dermatitis, unspecified: Secondary | ICD-10-CM | POA: Diagnosis not present

## 2018-11-18 DIAGNOSIS — G629 Polyneuropathy, unspecified: Secondary | ICD-10-CM | POA: Diagnosis not present

## 2018-11-18 DIAGNOSIS — M069 Rheumatoid arthritis, unspecified: Secondary | ICD-10-CM | POA: Diagnosis not present

## 2018-11-18 DIAGNOSIS — I10 Essential (primary) hypertension: Secondary | ICD-10-CM | POA: Diagnosis not present

## 2018-11-18 DIAGNOSIS — M1990 Unspecified osteoarthritis, unspecified site: Secondary | ICD-10-CM | POA: Diagnosis not present

## 2018-11-18 NOTE — Patient Outreach (Signed)
[facility]) Care Management  11/18/2018  Katherine Baird 02/24/1951 [id number]   CSW spoke with pt's sister by phone. They are continuing to work towards moving pt to her own independent living/retirement community, "[facility] They have ordered a mattress and are working on arrangements for private duty care for pt to have at [facility].   CSW also made contact with pt who confirmed her identity. She also reports plans to secure furniture (bed, etc) and move to the [facility] [place] apartments. She is uncertain when this will be finalized but anticipates soon. Pt denies any past mental health hospitalizations as well as outpatient support. CSW made her aware of services and will plan to mail info on [place] and other options for mental health care/counseling.   CSW inquired with pt about her B12 shots as well as her attempt to get her Tramadol refilled. She stated she has not been successful with getting either coordinated. CSW encouraged her to call her PCP as soon as possible to get these things resolved.  CSW also updated [facility] RNCM who plans to follow up with pt and PCP.  CSW will plan to follow up with pt and her sister next week for updates and further needs and support.    [name], MSW, LCSW Clinical Social Worker  [facility] [employer] [phone]

## 2018-11-19 ENCOUNTER — Encounter: Payer: Self-pay | Admitting: *Deleted

## 2018-11-19 ENCOUNTER — Other Ambulatory Visit: Payer: Self-pay | Admitting: *Deleted

## 2018-11-19 ENCOUNTER — Ambulatory Visit: Payer: Medicare Other | Admitting: *Deleted

## 2018-11-19 ENCOUNTER — Other Ambulatory Visit: Payer: Self-pay

## 2018-11-19 DIAGNOSIS — M5115 Intervertebral disc disorders with radiculopathy, thoracolumbar region: Secondary | ICD-10-CM | POA: Diagnosis not present

## 2018-11-19 DIAGNOSIS — G629 Polyneuropathy, unspecified: Secondary | ICD-10-CM | POA: Diagnosis not present

## 2018-11-19 DIAGNOSIS — M4185 Other forms of scoliosis, thoracolumbar region: Secondary | ICD-10-CM | POA: Diagnosis not present

## 2018-11-19 DIAGNOSIS — M3500 Sicca syndrome, unspecified: Secondary | ICD-10-CM | POA: Diagnosis not present

## 2018-11-19 DIAGNOSIS — M810 Age-related osteoporosis without current pathological fracture: Secondary | ICD-10-CM | POA: Diagnosis not present

## 2018-11-19 DIAGNOSIS — I7300 Raynaud's syndrome without gangrene: Secondary | ICD-10-CM | POA: Diagnosis not present

## 2018-11-19 DIAGNOSIS — K219 Gastro-esophageal reflux disease without esophagitis: Secondary | ICD-10-CM | POA: Diagnosis not present

## 2018-11-19 DIAGNOSIS — I429 Cardiomyopathy, unspecified: Secondary | ICD-10-CM | POA: Diagnosis not present

## 2018-11-19 DIAGNOSIS — L309 Dermatitis, unspecified: Secondary | ICD-10-CM | POA: Diagnosis not present

## 2018-11-19 DIAGNOSIS — I10 Essential (primary) hypertension: Secondary | ICD-10-CM | POA: Diagnosis not present

## 2018-11-19 DIAGNOSIS — M069 Rheumatoid arthritis, unspecified: Secondary | ICD-10-CM | POA: Diagnosis not present

## 2018-11-19 DIAGNOSIS — M1990 Unspecified osteoarthritis, unspecified site: Secondary | ICD-10-CM | POA: Diagnosis not present

## 2018-11-19 DIAGNOSIS — E785 Hyperlipidemia, unspecified: Secondary | ICD-10-CM | POA: Diagnosis not present

## 2018-11-19 NOTE — Patient Outreach (Addendum)
[facility]) Care Management  11/19/2018  Mariko Rudge September 29, 1950 [id number]   Care Coordination   Follow up email to [facility] regarding previous referral to telephonic behavioral support program.  Unsuccessful telephone call to patient , place call to patient at her mothers home , person answering reports patient is at her home Placed call to patient home number , no answer able to leave a HIPAA compliant message for return call.   1530  Return call to patient she report resting now she has just returned from her home to her moms Patient voice hoarse and low states that she has laryngitis beginning today, voice comes and goes thinks it is related to allergies.  She denies fever, cough, sore throat, shortness of breath  reports tolerating  eat and drinking on today , she complains of feeling tired. Discussed current virus and symptoms to notify MD of .   Discussed with patient regarding whether she has been able to get refill on Tramadol .  Discussed following up patient B12 injection ,patient states that she had made an appointment with nurse at PCP office in the morning. Patient states that her sister will provide transportation.   Discussed whether she has heard from, [facility] behavorial health program she states that she hasn't .  Reviewed with patient, [facility] emotional support line , she is not able to take number at this time .   Plan  Reviewed worsening symptoms to notify Md of shortness of breath , fever, cough, increased weakness .  Will plan follow up call to patient in the next week.  Will update LCSW on visit today.    [name], RN, [facility] Care Management,Care Management Coordinator  [phone]- Mobile [phone]- Toll Free Main Office

## 2018-11-20 ENCOUNTER — Ambulatory Visit (INDEPENDENT_AMBULATORY_CARE_PROVIDER_SITE_OTHER): Payer: Medicare Other

## 2018-11-20 ENCOUNTER — Ambulatory Visit: Payer: Medicare Other | Admitting: *Deleted

## 2018-11-20 DIAGNOSIS — E538 Deficiency of other specified B group vitamins: Secondary | ICD-10-CM | POA: Diagnosis not present

## 2018-11-20 MED ORDER — CYANOCOBALAMIN 1000 MCG/ML IJ SOLN
1000.0000 ug | Freq: Once | INTRAMUSCULAR | Status: AC
  Administered 2018-11-20: 1000 ug via INTRAMUSCULAR

## 2018-11-20 NOTE — Progress Notes (Signed)
cyan 

## 2018-11-23 DIAGNOSIS — I429 Cardiomyopathy, unspecified: Secondary | ICD-10-CM | POA: Diagnosis not present

## 2018-11-23 DIAGNOSIS — M5115 Intervertebral disc disorders with radiculopathy, thoracolumbar region: Secondary | ICD-10-CM | POA: Diagnosis not present

## 2018-11-23 DIAGNOSIS — M1990 Unspecified osteoarthritis, unspecified site: Secondary | ICD-10-CM | POA: Diagnosis not present

## 2018-11-23 DIAGNOSIS — L309 Dermatitis, unspecified: Secondary | ICD-10-CM | POA: Diagnosis not present

## 2018-11-23 DIAGNOSIS — G629 Polyneuropathy, unspecified: Secondary | ICD-10-CM | POA: Diagnosis not present

## 2018-11-23 DIAGNOSIS — K219 Gastro-esophageal reflux disease without esophagitis: Secondary | ICD-10-CM | POA: Diagnosis not present

## 2018-11-23 DIAGNOSIS — E785 Hyperlipidemia, unspecified: Secondary | ICD-10-CM | POA: Diagnosis not present

## 2018-11-23 DIAGNOSIS — M069 Rheumatoid arthritis, unspecified: Secondary | ICD-10-CM | POA: Diagnosis not present

## 2018-11-23 DIAGNOSIS — I10 Essential (primary) hypertension: Secondary | ICD-10-CM | POA: Diagnosis not present

## 2018-11-23 DIAGNOSIS — M4185 Other forms of scoliosis, thoracolumbar region: Secondary | ICD-10-CM | POA: Diagnosis not present

## 2018-11-23 DIAGNOSIS — I7300 Raynaud's syndrome without gangrene: Secondary | ICD-10-CM | POA: Diagnosis not present

## 2018-11-23 DIAGNOSIS — M810 Age-related osteoporosis without current pathological fracture: Secondary | ICD-10-CM | POA: Diagnosis not present

## 2018-11-23 DIAGNOSIS — M3500 Sicca syndrome, unspecified: Secondary | ICD-10-CM | POA: Diagnosis not present

## 2018-11-24 DIAGNOSIS — K219 Gastro-esophageal reflux disease without esophagitis: Secondary | ICD-10-CM | POA: Diagnosis not present

## 2018-11-24 DIAGNOSIS — L309 Dermatitis, unspecified: Secondary | ICD-10-CM | POA: Diagnosis not present

## 2018-11-24 DIAGNOSIS — M810 Age-related osteoporosis without current pathological fracture: Secondary | ICD-10-CM | POA: Diagnosis not present

## 2018-11-24 DIAGNOSIS — M4185 Other forms of scoliosis, thoracolumbar region: Secondary | ICD-10-CM | POA: Diagnosis not present

## 2018-11-24 DIAGNOSIS — I10 Essential (primary) hypertension: Secondary | ICD-10-CM | POA: Diagnosis not present

## 2018-11-24 DIAGNOSIS — I7300 Raynaud's syndrome without gangrene: Secondary | ICD-10-CM | POA: Diagnosis not present

## 2018-11-24 DIAGNOSIS — I429 Cardiomyopathy, unspecified: Secondary | ICD-10-CM | POA: Diagnosis not present

## 2018-11-24 DIAGNOSIS — M5115 Intervertebral disc disorders with radiculopathy, thoracolumbar region: Secondary | ICD-10-CM | POA: Diagnosis not present

## 2018-11-24 DIAGNOSIS — G629 Polyneuropathy, unspecified: Secondary | ICD-10-CM | POA: Diagnosis not present

## 2018-11-24 DIAGNOSIS — M069 Rheumatoid arthritis, unspecified: Secondary | ICD-10-CM | POA: Diagnosis not present

## 2018-11-24 DIAGNOSIS — E785 Hyperlipidemia, unspecified: Secondary | ICD-10-CM | POA: Diagnosis not present

## 2018-11-24 DIAGNOSIS — M1990 Unspecified osteoarthritis, unspecified site: Secondary | ICD-10-CM | POA: Diagnosis not present

## 2018-11-24 DIAGNOSIS — M3500 Sicca syndrome, unspecified: Secondary | ICD-10-CM | POA: Diagnosis not present

## 2018-11-25 ENCOUNTER — Other Ambulatory Visit: Payer: Self-pay | Admitting: *Deleted

## 2018-11-25 NOTE — Patient Outreach (Signed)
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     ED visits 11/3 Dx: Chest pain  ED visit 11/22 at [facility] ED Dx : Pyelonephritis , back pain  ED visit 11/24 at [facility] ED Dx: Abdominal pain, Constipation. Referral to [facility] spine center regarding T4 compression fracture, await phone call within a week regarding scheduling an appointment . Admission to [facility] 12/4- 12/10 Dx: Vomiting intractability, abdominal pain, dehydration, constipation, FTT,concern for depressive illnesschronic/remote Lacunar infarcts  Received text message from patient sister Katherine Baird, regarding concerns for patient needed level of care. Katherine Baird discussed that patient informed her/brother patient was not well enough to move at this time. Katherine Baird asking about options for care available, if assisted living was an option.   Outreach call to Katherine Baird , [facility] of patient , she dicussed concerns of patient being able to manage on her own if she moved into an independent living setting. She discussed concern regarding patient continues to experience a fall about once a week.  She discussed she has spoken with someone about being able to provide some personal assistance with patient if/when she moves to independent care but not until after the covid 19 crisis is relieved . Katherine Baird discussed  patient has independent living apartment at [facility] ,that she has been paying on the last 2 months and unsure if she will be able to stay at that level due to her functional needs.  Katherine Baird discussed that patient needs  someone to oversee her daily, with reminding about eating , taking her medication  , she also voiced concern with patient memory .   Placed call to [facility] home health to follow up with Katherine Baird regarding patient progress , functional deficits and care needs able to leave a message for return call.   Fall Risk  Patient dicussed that she had another fall in the next week while at her brothers home reports lose of balance. Patient continues to complain of leg just giving away at times as well as loosing feeling in her hands and just drops items that she is holding in her hand,  reports it being related to neuropathy.  Patient still followed by home physical therapy , and visit on yesterday.   Nutrition  Patient has not weighed at home recently, but states she does not believe she has gained any weight . Patient again states that she is drinking boost about twice daily instead of 4 times reports that it is causing her diarrhea. Patient reports eating small frequent meals trying to eat something every 2 hours.   Social  Patient discussed recent visit with therapy that discussed this may not be a good time to move due to the virus. Patient discussed being nervous about moving to apartment that she may not be in good enough shape to do it now she reports that her family is still helping her get things ready at her apartment. Patient discussed assisted living option as she has thought about that , but her goal would be to be at her apartment, states that she is agreeable to hiring someone to come in and help her   Appointments Patient  for visit with nurse at PCP office for Vitamin B12 injection.   Plan Will communicate with Katherine Baird [facility] CSW regarding patient care needs.  Will plan return call to patient in the next week, and return [facility]  in next 2 business days if no response on today.  Provided patient with [facility] member emotional support line number.  Will plan  communication with provider regarding patient continued  concerns of falls.   Katherine Garibaldi, RN, [facility] Care Management,Care Management Coordinator  [phone]- Mobile [phone]- Toll Free Main Office

## 2018-11-26 ENCOUNTER — Other Ambulatory Visit: Payer: Self-pay | Admitting: *Deleted

## 2018-11-26 DIAGNOSIS — I10 Essential (primary) hypertension: Secondary | ICD-10-CM | POA: Diagnosis not present

## 2018-11-26 DIAGNOSIS — M3500 Sicca syndrome, unspecified: Secondary | ICD-10-CM | POA: Diagnosis not present

## 2018-11-26 DIAGNOSIS — M4185 Other forms of scoliosis, thoracolumbar region: Secondary | ICD-10-CM | POA: Diagnosis not present

## 2018-11-26 DIAGNOSIS — M810 Age-related osteoporosis without current pathological fracture: Secondary | ICD-10-CM | POA: Diagnosis not present

## 2018-11-26 DIAGNOSIS — M1990 Unspecified osteoarthritis, unspecified site: Secondary | ICD-10-CM | POA: Diagnosis not present

## 2018-11-26 DIAGNOSIS — K219 Gastro-esophageal reflux disease without esophagitis: Secondary | ICD-10-CM | POA: Diagnosis not present

## 2018-11-26 DIAGNOSIS — M5115 Intervertebral disc disorders with radiculopathy, thoracolumbar region: Secondary | ICD-10-CM | POA: Diagnosis not present

## 2018-11-26 DIAGNOSIS — I429 Cardiomyopathy, unspecified: Secondary | ICD-10-CM | POA: Diagnosis not present

## 2018-11-26 DIAGNOSIS — G629 Polyneuropathy, unspecified: Secondary | ICD-10-CM | POA: Diagnosis not present

## 2018-11-26 DIAGNOSIS — L309 Dermatitis, unspecified: Secondary | ICD-10-CM | POA: Diagnosis not present

## 2018-11-26 DIAGNOSIS — E785 Hyperlipidemia, unspecified: Secondary | ICD-10-CM | POA: Diagnosis not present

## 2018-11-26 DIAGNOSIS — I7300 Raynaud's syndrome without gangrene: Secondary | ICD-10-CM | POA: Diagnosis not present

## 2018-11-26 DIAGNOSIS — M069 Rheumatoid arthritis, unspecified: Secondary | ICD-10-CM | POA: Diagnosis not present

## 2018-11-26 NOTE — Patient Outreach (Signed)
[facility]) Care Management  11/26/2018  Katherine Baird 25-Aug-1951 [id number]   CSW has had several phone conversations with pt's sister,[name], patient and [facility] RNCM, [name], regarding pt and plans.  Pt has reserved and is paying to hold a room at [facility]. They have been working to get a new bed and other items for her to move in.  There has been some conversation this week about if pt is appropriate for this type of setting or if she may need a higher level of care; ALF or SNF.  CSW has discussed this with pt and her sister- the pt is agreeable to making a move but does not seem to be taking steps to finalize the plan or set a date to move, etc.   CSW can continue to offer support and assistance however pt will have to make decisions and act upon them.   CSW will continue to outreach pt, sister and others in hopes of an appropriate transition for pt soon.    [name], MSW, LCSW Clinical Social Worker  [facility] [employer] [phone]

## 2018-11-27 ENCOUNTER — Ambulatory Visit (INDEPENDENT_AMBULATORY_CARE_PROVIDER_SITE_OTHER): Payer: Medicare Other

## 2018-11-27 ENCOUNTER — Other Ambulatory Visit: Payer: Self-pay | Admitting: *Deleted

## 2018-11-27 ENCOUNTER — Ambulatory Visit: Payer: Self-pay | Admitting: *Deleted

## 2018-11-27 ENCOUNTER — Other Ambulatory Visit: Payer: Self-pay

## 2018-11-27 DIAGNOSIS — E538 Deficiency of other specified B group vitamins: Secondary | ICD-10-CM | POA: Diagnosis not present

## 2018-11-27 NOTE — Patient Outreach (Signed)
[facility]) Care Management  11/27/2018  Katherine Baird July 27, 1951 [id number]   CSW attempted to reach pt today but was unsuccessful. CSW was advised by [facility] RNCM of family plans to meet this weekend to plan for going forward with plans for pt (ALF, apartment, etc)  CSW will plan an outreach call Monday for updates.   [name], MSW, LCSW Clinical Social Worker  [facility] [employer] [phone]

## 2018-11-27 NOTE — Patient Outreach (Signed)
[facility]) Care Management  11/27/2018  Katherine Baird November 03, 1950 [id number]   Care coordination    Outreach call to [facility] home health , able to speak with physical therapist Tammy Sours, that had visit with patient on yesterday. Physical therapist discussed patient progress, they have done all they can do,as far a functional standpoint, patient is able to ambulate independently, but continues to be high risk , having frequent fall. Patient has been completed home physical therapy , she has discharge visit with OT on 4/6.  Discussed patient chronic conditions that contribute to fall risk. malnutrition, depression , DDD.   Care coordination call to [employer] referral regarding referral form sent for telephonic  behavorial program, representative referred to online site [employer] [facility] [employer] behavorial mental health services that patient or family can search for provider for telephone/virtual  visits without need for provider referral , she states due to covid 19 many doctors, therapist are doing virtual visits .   Placed call to patient sister Cleon Dew , [facility] regarding follow up on mental health counselor,provided her with [employer] [facility] , online information for setting up a virtual visit. Also discussed with her that I have provided patient with [facility] member free emotional support line contact number [phone]. Discuss that patient may  need support reinforcement with follow up on mental health counseling, sister will visit on this weekend and assist.   Patient sister discussed that she has also checked on ALF in [place] that may have an opening in the next 2 weeks, she reports that they have a family meeting with patient on this weekend to discuss best safest plan, patient goals, as patient continues to be a high fall risk and needs assistance with personal care,and supervision.   Plan  Will place return call to patient/sister within the next 2 weeks.  Will update  [name], LCSW of conversation on today . Will communicate with PCP   [name], RN, [facility] Care Management,Care Management Coordinator  [phone]- Mobile [phone]- Toll Free Main Office

## 2018-11-30 ENCOUNTER — Other Ambulatory Visit: Payer: Self-pay | Admitting: *Deleted

## 2018-11-30 DIAGNOSIS — M5115 Intervertebral disc disorders with radiculopathy, thoracolumbar region: Secondary | ICD-10-CM | POA: Diagnosis not present

## 2018-11-30 DIAGNOSIS — I10 Essential (primary) hypertension: Secondary | ICD-10-CM | POA: Diagnosis not present

## 2018-11-30 DIAGNOSIS — K219 Gastro-esophageal reflux disease without esophagitis: Secondary | ICD-10-CM | POA: Diagnosis not present

## 2018-11-30 DIAGNOSIS — M810 Age-related osteoporosis without current pathological fracture: Secondary | ICD-10-CM | POA: Diagnosis not present

## 2018-11-30 DIAGNOSIS — G629 Polyneuropathy, unspecified: Secondary | ICD-10-CM | POA: Diagnosis not present

## 2018-11-30 DIAGNOSIS — L309 Dermatitis, unspecified: Secondary | ICD-10-CM | POA: Diagnosis not present

## 2018-11-30 DIAGNOSIS — M3500 Sicca syndrome, unspecified: Secondary | ICD-10-CM | POA: Diagnosis not present

## 2018-11-30 DIAGNOSIS — I7300 Raynaud's syndrome without gangrene: Secondary | ICD-10-CM | POA: Diagnosis not present

## 2018-11-30 DIAGNOSIS — M1990 Unspecified osteoarthritis, unspecified site: Secondary | ICD-10-CM | POA: Diagnosis not present

## 2018-11-30 DIAGNOSIS — M069 Rheumatoid arthritis, unspecified: Secondary | ICD-10-CM | POA: Diagnosis not present

## 2018-11-30 DIAGNOSIS — E785 Hyperlipidemia, unspecified: Secondary | ICD-10-CM | POA: Diagnosis not present

## 2018-11-30 DIAGNOSIS — I429 Cardiomyopathy, unspecified: Secondary | ICD-10-CM | POA: Diagnosis not present

## 2018-11-30 DIAGNOSIS — M4185 Other forms of scoliosis, thoracolumbar region: Secondary | ICD-10-CM | POA: Diagnosis not present

## 2018-11-30 NOTE — Patient Outreach (Signed)
[facility]) Care Management  11/30/2018  Katherine Baird Apr 05, 1951 [id number]  CSW made contact with pt's sister who reports the family plans to meet tomorrow evening to discuss and make plans for pt. She also shares the [facility] OT indicated she would be ending her HH therapies. Sister concerned she needs the therapist for motivation and continued strengthening, balance, etc. CSW discussed that if she is not eligible for that going forward right now, that they may want to think about a hired caregiver that could assist and assist with exercises, etc.    CSW will plan a follow up call Wednesday after the family meeting for updates and further planning.     [name], MSW, LCSW Clinical Social Worker  [facility] [employer] [phone]

## 2018-12-02 ENCOUNTER — Telehealth: Payer: Self-pay

## 2018-12-02 ENCOUNTER — Other Ambulatory Visit: Payer: Self-pay | Admitting: *Deleted

## 2018-12-02 NOTE — Telephone Encounter (Signed)
She just had OT and PT with home health.  I signed orders 2 weeks ago.

## 2018-12-02 NOTE — Telephone Encounter (Signed)
Asher Muir is working on this one.

## 2018-12-02 NOTE — Telephone Encounter (Signed)
Patient will be going to a senior home soon and is asking that she be able to get some PT before going there as she will need to be more active and independent.

## 2018-12-02 NOTE — Patient Outreach (Signed)
[facility]) Care Management  12/02/2018  Katherine Baird 14-Jul-1951 [id number]   Care coordination call    Unsuccessful outreach call to patient,sister Katherine Baird, POA, to follow up on concerns discussed with LCSW [name] on this am, regarding patient need for nutrition education support, and mental health follow up.   Returned call from Katherine Baird ,discussed patient recent decision to proceed with moving to senior apartment and family assisting patient with arranging personal care worker. Katherine Baird discussed patient is actually making contact with someone that she has worked with in the past about staying with patient at least 4 hours in the morning and afternoon.  Sister discussed many safety features in apartment, medical alert bracelet , call bells through out the apartment . She discussed concern regarding patient continued fall risk , she discussed  patient contacting PCP office regarding resuming OT therapy for when she moves to her an apartment . Sister believes the extra support in the home to help with daily care will be a benefit .  Discussed concern regarding patient nutrition , patient does well when someone is there to remind her to eat meals as well as drink boost., discussed having hired assistance will be good for patient for reminding and encouraging, sister discussed waiting until patient in new apartment before attending nutrition class again if patient agreeable   Discussed with sister follow up on contacting [employer] [facility] [place] health care  , for assisting patient with arranging   tele mental health follow up for grief and depression follow up , sister is in town this week and plans to assist patient and contact me if problems with contacting.   Plan  Will plan follow up call to patient as scheduled  in the next week.  Reinforced notifying MD of increased falls or injury.   [name], RN, [facility] Care  Management,Care Management Coordinator  [phone]- Mobile [phone]- Toll Free Main Office

## 2018-12-02 NOTE — Patient Outreach (Signed)
Katherine Baird [facility]) Care Management  12/02/2018  Katherine Baird 05-20-1951 [id number]   CSW spoke with pt's sister who reports they met as a family and pt has agreed to moving to the apartment with caregivers arranged to help. They anticipate the move to happen in the next 2 weeks.  Pt's sister is planning to contact PCP to request resumption of HHPT services- CSW will follow up with community based mental health support (grief counseling,etc) and attempt to meet with pt in person at a later time.  Pt's sister is concerned about pt's weight (loss) and is hopeful she can be connected with some Nutrition support. CSW has briefed [facility] RNCM, Katherine Baird, to above and will also share with PCP.  CSW will plan follow up call in 7-10 days.  Katherine Baird, MSW, [place] Worker  [place] [phone]

## 2018-12-06 ENCOUNTER — Emergency Department
Admission: EM | Admit: 2018-12-06 | Discharge: 2018-12-06 | Disposition: A | Discharge status: Home / Self Care | Payer: Medicare Other | Attending: Emergency Medicine | Admitting: Emergency Medicine

## 2018-12-06 ENCOUNTER — Emergency Department: Payer: Medicare Other

## 2018-12-06 ENCOUNTER — Encounter: Payer: Self-pay | Admitting: Emergency Medicine

## 2018-12-06 DIAGNOSIS — W0110XA Fall on same level from slipping, tripping and stumbling with subsequent striking against unspecified object, initial encounter: Secondary | ICD-10-CM | POA: Insufficient documentation

## 2018-12-06 DIAGNOSIS — M533 Sacrococcygeal disorders, not elsewhere classified: Secondary | ICD-10-CM | POA: Insufficient documentation

## 2018-12-06 DIAGNOSIS — S51811A Laceration without foreign body of right forearm, initial encounter: Secondary | ICD-10-CM | POA: Diagnosis not present

## 2018-12-06 DIAGNOSIS — I110 Hypertensive heart disease with heart failure: Secondary | ICD-10-CM | POA: Diagnosis not present

## 2018-12-06 DIAGNOSIS — E039 Hypothyroidism, unspecified: Secondary | ICD-10-CM | POA: Insufficient documentation

## 2018-12-06 DIAGNOSIS — S3992XA Unspecified injury of lower back, initial encounter: Secondary | ICD-10-CM | POA: Diagnosis not present

## 2018-12-06 DIAGNOSIS — Y999 Unspecified external cause status: Secondary | ICD-10-CM | POA: Insufficient documentation

## 2018-12-06 DIAGNOSIS — Z7982 Long term (current) use of aspirin: Secondary | ICD-10-CM | POA: Insufficient documentation

## 2018-12-06 DIAGNOSIS — R0902 Hypoxemia: Secondary | ICD-10-CM | POA: Diagnosis not present

## 2018-12-06 DIAGNOSIS — I5032 Chronic diastolic (congestive) heart failure: Secondary | ICD-10-CM | POA: Diagnosis not present

## 2018-12-06 DIAGNOSIS — Z79899 Other long term (current) drug therapy: Secondary | ICD-10-CM | POA: Diagnosis not present

## 2018-12-06 DIAGNOSIS — R001 Bradycardia, unspecified: Secondary | ICD-10-CM | POA: Diagnosis not present

## 2018-12-06 DIAGNOSIS — W19XXXA Unspecified fall, initial encounter: Secondary | ICD-10-CM | POA: Diagnosis not present

## 2018-12-06 DIAGNOSIS — Y929 Unspecified place or not applicable: Secondary | ICD-10-CM | POA: Insufficient documentation

## 2018-12-06 DIAGNOSIS — Y939 Activity, unspecified: Secondary | ICD-10-CM | POA: Diagnosis not present

## 2018-12-06 DIAGNOSIS — M5489 Other dorsalgia: Secondary | ICD-10-CM | POA: Diagnosis not present

## 2018-12-06 MED ORDER — ACETAMINOPHEN 325 MG PO TABS
650.0000 mg | ORAL_TABLET | Freq: Once | ORAL | Status: AC
  Administered 2018-12-06: 13:00:00 650 mg via ORAL
  Filled 2018-12-06: qty 2

## 2018-12-06 NOTE — ED Notes (Signed)
Pt able to ambulate with walker. Pt uses walker at home. Pt called brother and he will come and pick her up.

## 2018-12-06 NOTE — ED Triage Notes (Signed)
Pt to ED by EMS after mechanical fall. Pt fell backwards and now has c/o sacral pain and skin tear to right arm. Pt denies hitting head and LOC.

## 2018-12-06 NOTE — Discharge Instructions (Addendum)
To the emergency room for any new or worrisome symptoms take Tylenol as directed for this discomfort.  Return to the emergency room for numbness weakness increased pain or other concerns.  Keep the area of the wound clean and intact.  You may consider putting bacitracin on it every day and changing the dressing on a daily basis.

## 2018-12-06 NOTE — ED Provider Notes (Addendum)
[facility] Emergency Department Provider Note  ____________________________________________   I have reviewed the triage vital signs and the nursing notes. Where available I have reviewed prior notes and, if possible and indicated, outside hospital notes.    HISTORY  Chief Complaint Fall    HPI Katherine Baird is a 68 y.o. female who states that she fell today.  She did not syncopal fall while walking tripped.  Remembers falling did not hit her head landed on her bottom has pain to her coccyx no other complaints or injury aside from a mild skin tear to the right arm with no complaint of bony tenderness numbness or weakness.  He did not have any prodrome chest pain shortness of breath she had no abdominal pain she did not hit her head she does not feel numb or weak.  She is completely recollecting the entire event and she feels otherwise at her baseline.   Past Medical History:  Diagnosis Date  . Acute midline thoracic back pain 06/23/2018  . CHF (congestive heart failure) (HCC)   . Hypertension   . Osteoporosis   . Thyroid disease     Patient Active Problem List   Diagnosis Date Noted  . Vitamin B12 nutritional deficiency 11/03/2018  . Age-related osteoporosis with current pathological fracture of vertebra with routine healing 09/30/2018  . Personal history of fall 09/30/2018  . HNP (herniated nucleus pulposus), thoracic 09/22/2018  . Paroxysmal atrial fibrillation (HCC) 08/28/2018  . Mood disorder (HCC) 08/10/2018  . Severe protein-calorie malnutrition (HCC) 08/10/2018  . Late effect of lacunar infarction 08/10/2018  . Takotsubo cardiomyopathy 08/10/2018  . Constipation 07/30/2018  . Bladder infection 07/14/2018  . Old myocardial infarction 07/02/2018  . Iron deficiency anemia 02/08/2018  . Fatigue 01/13/2018  . Chronic diastolic heart failure (HCC) 12/17/2017  . 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 Syncope 02/24/2017  . Hypothyroidism 05/24/2016  .  HLD (hyperlipidemia) 01/27/2015  . Paroxysmal digital cyanosis 01/27/2015  . Essential (primary) hypertension 01/11/2015  . Rheumatoid arthritis involving multiple joints (HCC) 01/11/2015  . 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 LAPAROSCOPIC HYSTERECTOMY  2001   total  . LEFT HEART CATH N/A 07/03/2018   Procedure: Left Heart Cath with Coronary Angiography;  Surgeon: Marcina Millard, MD;  Location: [facility] INVASIVE CV LAB;  Service: Cardiovascular;  Laterality: N/A;    Prior to Admission medications   Medication Sig Start Date End Date Taking? Authorizing Provider  acetaminophen (TYLENOL) 500 MG tablet Take 500 mg by mouth every 8 (eight) hours as needed for moderate pain.    [provider]  aspirin EC 81 MG tablet Take 81 mg by mouth daily.    [provider]  carvedilol (COREG) 6.25 MG tablet Take 6.25 mg by mouth 2 (two) times daily with a meal.     [provider]  clobetasol ointment (TEMOVATE) 0.05 % Apply 1 application topically 2 (two) times daily as needed (for irritation).     [provider]  denosumab (PROLIA) 60 MG/ML SOSY injection Inject 60 mg into the skin every 6 (six) months.    [provider]  docusate sodium (COLACE) 100 MG capsule Take 100 mg by mouth daily as needed for mild constipation. Taking one to two a day    [provider]  Dupilumab 300 MG/2ML SOSY INJECT THE CONTENTS OF 1 SYRINGE (300MG ) EVERY 2 WEEKS 09/09/18   [provider]  folic acid (  FOLVITE) 1 MG tablet Take 1 mg by mouth daily.    [provider]  gabapentin (NEURONTIN) 100 MG capsule Take 100 mg by mouth 4 (four) times daily.  10/27/18   [provider]  levothyroxine (SYNTHROID, LEVOTHROID) 50 MCG tablet TAKE 1 TABLET BY MOUTH  DAILY 10/30/18   Reubin MilanBerglund, Laura H, MD  mirtazapine (REMERON) 15 MG tablet Take 15 mg by mouth at bedtime. 08/04/18    [provider]  polyethylene glycol (MIRALAX / GLYCOLAX) packet Take 17 g by mouth daily as needed.  08/04/18   [provider]  traMADol (ULTRAM) 50 MG tablet Take 1 tablet (50 mg total) by mouth every 6 (six) hours as needed for moderate pain or severe pain. Patient taking differently: Take 50 mg by mouth 3 (three) times daily.  07/03/18   Houston SirenSainani, Vivek J, MD    Allergies Levofloxacin; Doxycycline; Erythromycin; Gabapentin; Guaifenesin; Nitrofuran derivatives; Prednisone; Sulfa antibiotics; Tetracycline; Azithromycin; and Carvedilol  Family History  Problem Relation Age of Onset  . Arthritis Mother   . Dementia Mother   . Heart disease Father   . Mesothelioma Father   . Kidney cancer Sister   . Heart disease Sister   . Arthritis Sister   . COPD Brother   . Arthritis Brother   . Arthritis Brother   . COPD Sister     Social History Social History   Tobacco Use  . Smoking status: Never Smoker  . Smokeless tobacco: Never Used  Substance Use Topics  . Alcohol use: No    Alcohol/week: 0.0 standard drinks  . Drug use: No    Review of Systems Constitutional: No fever/chills Eyes: No visual changes. ENT: No sore throat. No stiff neck no neck pain Cardiovascular: Denies chest pain. Respiratory: Denies shortness of breath. Gastrointestinal:   no vomiting.  No diarrhea.  No constipation. Genitourinary: Negative for dysuria. Musculoskeletal: Negative lower extremity swelling Skin: Negative for rash. Neurological: Negative for severe headaches, focal weakness or numbness.   ____________________________________________   PHYSICAL EXAM:  VITAL SIGNS: ED Triage Vitals  Enc Vitals Group     BP 12/06/18 1019 108/67     Pulse Rate 12/06/18 1019 71     Resp --      Temp 12/06/18 1019 97.6 F (36.4 C)     Temp Source 12/06/18 1019 Oral     SpO2 12/06/18 1019 100 %     Weight 12/06/18 1020 79 lb (35.8 kg)     Height 12/06/18 1020 4\' 10"  (1.473 m)      Head Circumference --      Peak Flow --      Pain Score 12/06/18 1020 7     Pain Loc --      Pain Edu? --      Excl. in GC? --     Constitutional: Alert and oriented. Well appearing and in no acute distress.  Cachectic lady in no acute distress Eyes: Conjunctivae are normal Head: Atraumatic HEENT: No congestion/rhinnorhea. Mucous membranes are moist.  Oropharynx non-erythematous Neck:   Nontender with no meningismus, no masses, no stridor Cardiovascular: Normal rate, regular rhythm. Grossly normal heart sounds.  Good peripheral circulation. Respiratory: Normal respiratory effort.  No retractions. Lungs CTAB. Abdominal: Soft and nontender. No distention. No guarding no rebound Back:  There is no focal tenderness or step off.  there is no midline tenderness there are no lesions noted. there is no CVA tenderness, she does have some tenderness to palpation to her coccyx.  Musculoskeletal: No lower extremity tenderness, no upper extremity tenderness. No joint effusions, no DVT signs strong distal pulses no edema Neurologic:  Normal speech and language. No gross focal neurologic deficits are appreciated.  Skin:  Skin is warm, dry and intact.  Skin tear noted to the right forearm.  No bony tenderness noted. Psychiatric: Mood and affect are normal. Speech and behavior are normal.  ____________________________________________   LABS (all labs ordered are listed, but only abnormal results are displayed)  Labs Reviewed - No data to display  Pertinent labs  results that were available during my care of the patient were reviewed by me and considered in my medical decision making (see chart for details). ____________________________________________  EKG  I personally interpreted any EKGs ordered by me or triage  ____________________________________________  RADIOLOGY  Pertinent labs & imaging results that were available during my care of the patient were reviewed by me and considered in my  medical decision making (see chart for details). If possible, patient and/or family made aware of any abnormal findings.  No results found. ____________________________________________    PROCEDURES  Procedure(s) performed: None  Procedures  Critical Care performed: None  ____________________________________________   INITIAL IMPRESSION / ASSESSMENT AND PLAN / ED COURSE  Pertinent labs & imaging results that were available during my care of the patient were reviewed by me and considered in my medical decision making (see chart for details).  Here after a non-syncopal fall with coccygeal discomfort.  No head injury clinically or by history.  Does not want a CT scan.  Clinically, it is not indicated at this time.  There is no evidence of bony fracture anywhere except for possibly her coccyx at this time.  We will obtain a pelvis and a coccygeal film.  She has no hip pain or tenderness full range of motion.  We will continue to evaluate there is no evidence however that the patient had a syncopal event.  ----------------------------------------- 12:27 PM on 12/06/2018 -----------------------------------------  Patient ambulatory with no difficulty, no evidence of fracture or neurologic decline, no evidence of closed head injury or head bleed, she appears well and we will discharge.  Superficial skin tear was treated and we have given her return instructions for all these problems and good follow-up    ____________________________________________   FINAL CLINICAL IMPRESSION(S) / ED DIAGNOSES  Final diagnoses:  None      This chart was dictated using voice recognition software.  Despite best efforts to proofread,  errors can occur which can change meaning.      Jeanmarie Plant, MD 12/06/18 1105    Jeanmarie Plant, MD 12/06/18 1227

## 2018-12-06 NOTE — ED Notes (Signed)
Wound cleaned and redressed

## 2018-12-07 ENCOUNTER — Other Ambulatory Visit: Payer: Self-pay | Admitting: *Deleted

## 2018-12-07 NOTE — Patient Outreach (Signed)
[facility]) Care Management  12/07/2018  Katherine Baird 11-04-1950 [id number]  Telephone assessment     Incoming telephone call from patient sister , Katherine Baird   She discussed having concern regarding patient recurrent falls, she discussed making contact with personal care sitter she had previously spoken with regarding increasing hours that they  will be able to stay with patient in her apartment . Patient sister is uncertain if patient agreeable to or can afford 24 hour hired assistance or if another level of care would be best for her,as they previously discussed assisted living and patient prefers the senior apartment.  Outreach call to patient, she discussed recent fall , states she  fell hard on  Her back,she reports having her walker, denies feeling dizzy , all of a sudden she couldn't balance with the walker, reports she was walking into the other room to tell she sister good bye.  Patient discussed being frustrated by the frequent fall, using her walker. Discussed with patient fall prevention measures, using bedside commode at night, she declines , has bsc over commode in bathroom.  She discussed taking tylenol and prn tramadol for pain that is helping some, she reports resting more on today.  Patient reports that she is still working in eating every few hours during the day, family and friends make sure easy to access food is in  home. Patient discussed that she plans to move to the senior  apartment , in a few weeks after family get everything ready and discussed with patient if that is her goal it may be beneficial to have extra support in home until she get adjusted.  Discussed again patient thoughts on seeking a mental health  counselor as previously recommended by medical team,  patient states she is agreeable to speaking with someone over the phone since she can not go out now.    Placed call to [name] office to discuss , office is closed  To discuss   concern regarding patient recurrent falls, most recent visit to ED on 4/12, and to discuss patient tentative plans  to move into independent senior  apartment with hired personal care assistance, family working on arranging increased hours up to 24 hours if possible ,  they have also discussed assisted living at this time patient wants to pursue moving into apartment that she had been on the waiting list for a while.    Plan Will plan return call to Dr.Beglund office on next business day.  Will plan follow up call to patient within this week.  Will update Katherine Baird , LCSW with this call.    [name], RN, [facility] Care Management,Care Management Coordinator  [phone]- Mobile [phone]- Toll Free Main Office

## 2018-12-08 ENCOUNTER — Other Ambulatory Visit: Payer: Self-pay | Admitting: *Deleted

## 2018-12-08 ENCOUNTER — Telehealth: Payer: Self-pay

## 2018-12-08 NOTE — Patient Outreach (Signed)
[facility]) Care Management  12/08/2018  Shunita Ruch 1951/02/10 [id number]    Care Coordination call  Placed call to Dr. Judithann Graves office , spoke with Asher Muir nurse to discuss concern regarding patient recent fall and ongoing concern for frequent falls. Also to discuss patient plans to move to senior independent with family assisting with arranging personal care sitter to be with patient . Received return call from Asher Muir at [facility] office , discussed Independent senior apartment will be allowed as long as patient has life alert, personal care until patient meets medicare diagnosis for assisted living or other changes in conditions that may be covered.   In basket message from [place] regarding patient previous request of home health  therapy , discussed that patient may benefit from this due to recent falls and ED visit related to fall .   Received message from patient sister [name] , [facility] regarding sending email  contact with counselor in [place] area [place]  on behalf of patient . Discussed that I will notify [name], [facility] LCSW regarding contact being made for follow up.    Also Discussed with patient sister recommended follow up with Dr. Judithann Graves after recent ED visit , she will discuss with her sister in town  regarding arranging follow up.   Plan  Will plan follow up with patient/sister within a week.  Communicate update with [name], LCSW ,[facility]  [name], RN, [facility] Care Management,Care Management Coordinator  [phone]- Mobile [phone]- Toll Free Main Office

## 2018-12-08 NOTE — Telephone Encounter (Signed)
[facility] called to get PCP opinion on patient with high fall risk still wanting to go to independent senior home. She has falls when people are there helping her and when alone. Her family is working to hire personal care rep to come out and will pay out of pocket so not sure how often someone will come or be there. Please see [facility] last Telephone note.I will cal [facility] back after you let me know your opinion on this matter.

## 2018-12-08 NOTE — Telephone Encounter (Signed)
I already completed the paperwork for independent living in January.  Unfortunately, I do not think that frequent falls would necessitate a need for Assisted living.  She was advised to get a Life Alert as well.

## 2018-12-08 NOTE — Progress Notes (Signed)
Sent this to Kim with [facility] to see if she feels this is needed since [facility] is being set up via family.

## 2018-12-08 NOTE — Telephone Encounter (Signed)
Correction: [facility] Caseworker: [name] not Olegario Messier. I spoke to [name] regarding PCP having signed orders for [employer] in January. Does not feel Frequent Falls will be a covered diagnosis that will allow for Assisted Living/Long Term Care. Advised we should allow this as long as she has the Life Alert until patient and family and Medicare will declare Assisted Living under frequent falls orother diagnosis that may be covered.

## 2018-12-09 ENCOUNTER — Other Ambulatory Visit: Payer: Self-pay | Admitting: *Deleted

## 2018-12-09 ENCOUNTER — Ambulatory Visit: Payer: Self-pay | Admitting: *Deleted

## 2018-12-09 DIAGNOSIS — M5416 Radiculopathy, lumbar region: Secondary | ICD-10-CM | POA: Diagnosis not present

## 2018-12-09 DIAGNOSIS — M5126 Other intervertebral disc displacement, lumbar region: Secondary | ICD-10-CM | POA: Diagnosis not present

## 2018-12-09 DIAGNOSIS — M5136 Other intervertebral disc degeneration, lumbar region: Secondary | ICD-10-CM | POA: Diagnosis not present

## 2018-12-09 NOTE — Patient Outreach (Signed)
[facility]) Care Management  12/09/2018  Katherine Baird 1950/11/02 [id number]   CSW spoke with pt by phone who mentioned her fall over the weekend; states she is still having some leg issues and using a wheelchair. She is staying at her mothers because of the fall. CSW inquired with pt about her plans to move into the apartment to which she states; "It's on hold because of my fall until I get stronger".   CSW also spoke with pt's sister, [name], who shared they have completely moved [name]'s belongings into the apartment and have it set up for her.  Sister shared with CSW that they were awaiting input from PCP to which CSW was able to share Epic notes about not being ALF level.  Pt and sister report pt has a life alert system and the new apartment community also has a system (with wrist band) for emergency alerts.   Pt is also awaiting contact with a mental health counselor and private care "team" being arranged for in home/apartment care.   CSW encouraged both pt and sister to continue to work on plans for pt's move and to call with concerns, questions or needs.   CSW will update [facility] RNCM and PCP of above and plans f/u call next week.    [name], MSW, LCSW Clinical Social Worker  [facility] [employer] [phone]

## 2018-12-11 ENCOUNTER — Other Ambulatory Visit: Payer: Self-pay | Admitting: *Deleted

## 2018-12-11 NOTE — Patient Outreach (Signed)
[facility]) Care Management  12/11/2018  Katherine Baird 11-30-1950 [id number]  Care Coordination call   Outreach call to patient to follow up on PCP appointment after ED visit, Unsuccessful attempt , received busy signal x 2 call attempts.   Plan  If no return call today will plan  outreach call in the next 2 business days.   [name], RN, [facility] Care Management,Care Management Coordinator  [phone]- Mobile [phone]- Toll Free Main Office

## 2018-12-11 NOTE — Patient Outreach (Signed)
[facility]) Care Management  12/11/2018  Katherine Baird 22-Oct-1950 [id number]   CSW spoke with pt's sister by phone who is also feeling that pt should go ahead and make the move to the apartment setting that is being paid for and is furnished and ready. Per sister, she has messaged all her siblings who live locally and encouraged them to take steps and put effort towards pushing, encouraging/motivating [name] to move.  CSW reassured pt's sister of plans to follow along and reach out to pt next week.  CSW updated [name], [facility] RNCM, to above as well.  [name], MSW, LCSW Clinical Social Worker  [facility] [employer] [phone]

## 2018-12-11 NOTE — Patient Outreach (Signed)
[facility]) Care Management  12/11/2018  Katherine Baird 1950-10-26 [id number]   CSW spoke with pt who is still reluctant to make the move- stating she does not want to move into the apartment yet because of her fall and needing to use a wheelchair to get around due to pain. CSW reminded pt the apartment has been set up and all moved in per her sister and caregiver support is being arranged.  CSW left message for her sister and await callback.  CSW encouraged to call her PCP if needs (pain from fall) continue.  Pt has not heard from the behavioral health provider with her Insurance company yet.   CSW will update after contact made with pt's sister.   [name], MSW, LCSW Clinical Social Worker  [facility] [phone]

## 2018-12-14 ENCOUNTER — Ambulatory Visit: Payer: Self-pay | Admitting: *Deleted

## 2018-12-14 ENCOUNTER — Other Ambulatory Visit: Payer: Self-pay | Admitting: *Deleted

## 2018-12-14 ENCOUNTER — Telehealth: Payer: Self-pay

## 2018-12-14 NOTE — Patient Outreach (Signed)
[facility]) Care Management  12/14/2018  Katherine Baird 09-21-1950 [id number]   CSW was unable to reach pt today by phone. CSW will try again later this week.   [name], MSW, LCSW Clinical Social Worker  [facility] [employer] [phone]

## 2018-12-14 NOTE — Telephone Encounter (Signed)
Patient called LM asking for PT again. I called back and left detailed message stating that unless she is admitted she will not qualify for more PT. I told her she may be able to get another assessment after she moves but unsure if that kind of change will do it since we signed papers based on her saying she could do so many things on her own.

## 2018-12-14 NOTE — Telephone Encounter (Signed)
-----   Message from Georgette Dover, RN sent at 12/08/2018 11:36 AM EDT ----- Regarding: RE: Katherine Baird at [facility] No they will not provide home health services, It may be a good idea  for reevaluation given recent falls and if they can follow up at her new environment.   Kim  ----- Message ----- From: Letta Median, CMA Sent: 12/08/2018  11:15 AM EDT To: Georgette Dover, RN Subject: Katherine Baird at [facility]                        I just saw something. Does patient stil need Physical Therapy through [facility] as was requested by her a few weeks ago and PCP sent me note asking that I contact [facility] but if patient is finding Home Services should we still ask about this or `will they provide PT?

## 2018-12-15 ENCOUNTER — Other Ambulatory Visit: Payer: Self-pay | Admitting: *Deleted

## 2018-12-15 NOTE — Patient Outreach (Signed)
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     ED visit 11/22 at [facility] ED Dx : Pyelonephritis , back pain  ED visit 11/24 at [facility] ED Dx: Abdominal pain, Constipation. Referral to [facility] spine center regarding T4 compression fracture, await phone call within a week regarding scheduling an appointment . Admission to [facility] 12/4- 12/10 Dx: Vomiting intractability, abdominal pain, dehydration, constipation, FTT,concern for depressive illnesschronic/remote Lacunar infarcts ED visit 12/06/18 Fall , skin tear right forearm .Marland Kitchen  Outreach call to patient mobile number, no answer , mailbox full unable to leave a message.   Placed call to patient sister , Katherine Baird, [facility], discussed being unable to contact patient due to mailbox being full, she shared that patient has difficulty due to limited  feeling in fingers it is difficult to clear her phone mailbox.  [name] discussed wanting to share some good news, patient is now going to her apartment at [facility] independent living every morning and staying until 4 pm, when she returns to her moms home, patient brother is providing transportation.  Patient has hired personal care 8am to 4 pm they are able to help her with personal care daily, remind of meal intake.  Sister discussed that they are hopeful that once patient gets adjusted to being at apartment she will be able to stay at night, mentioning that patient has never liked  staying at night alone.  Patient sister discussed patient has multiple food choices in home, including  boost and hopeful with someone with her during meal times patient will increase intake.   Patient sister also discussed attempting to make contact regarding mental health, with therapist [place]  she also attempting contact Live and work well through [place] health care  and states patient has to make contact complete form online she was not able to make contact as her HCPOA.   Patient sister suggest attempting to contact patient later today, when she returns to her moms home.   1630 Placed return call to patient mobile number no answer able to leave a HIPAA compliant message for return call.  Unable to address care plan goals at this time.   Plan  Will update [name] , LCSW involved in patient care . Will plan return call to patient a the week.    [name], RN, [facility] Care Management,Care Management Coordinator  [phone]- Mobile [phone]- Toll Free Main Office

## 2018-12-16 DIAGNOSIS — M5126 Other intervertebral disc displacement, lumbar region: Secondary | ICD-10-CM | POA: Diagnosis not present

## 2018-12-16 DIAGNOSIS — M5416 Radiculopathy, lumbar region: Secondary | ICD-10-CM | POA: Diagnosis not present

## 2018-12-17 ENCOUNTER — Other Ambulatory Visit: Payer: Self-pay | Admitting: *Deleted

## 2018-12-17 ENCOUNTER — Ambulatory Visit: Payer: Self-pay | Admitting: *Deleted

## 2018-12-17 NOTE — Patient Outreach (Signed)
[facility]) Care Management  12/17/2018  Katherine Baird 1951/04/10 [id number]   CSW was unable to reach pt today by phone. CSW left voice message and will try again next week.   [name], MSW, LCSW Clinical Social Worker  [facility] [phone]

## 2018-12-18 ENCOUNTER — Other Ambulatory Visit: Payer: Self-pay | Admitting: *Deleted

## 2018-12-18 NOTE — Patient Outreach (Signed)
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  ED visit 11/22 at [facility] ED Dx : Pyelonephritis , back pain  ED visit 11/24 at [facility] ED Dx: Abdominal pain, Constipation. Referral to [facility] spine center regarding T4 compression fracture, await phone call within a week regarding scheduling an appointment . Admission to [facility] 12/4- 12/10 Dx: Vomiting intractability, abdominal pain, dehydration, constipation, FTT,concern for depressive illnesschronic/remote Lacunar infarcts ED visit 12/06/18 Fall , skin tear right forearm .Marland Kitchen  Successful outreach call to patient , she discussed feeling some better. Patient sounds very strong in her voice, upbeat. She reports she has been working hard on getting back to walking.  Social Patient reports that she is now at her apartment staying during the day from about 7am to 4 pm. Patient states that she has Biomedical engineer with her during the  time she is at her apartment , discussed that it is good having the company with her. Patient discussed her goal of being able to stay at her apartment 24 hours a day as she gets stronger, taking it day to day. Patient discussed needing to be a more self sufficient to stay alone.  Nutrition  Patient reports she feels as if she is taking in more nutrition, she refers to sitters as her cheerleaders reminding her about eating, bringing her food in the morning with more carbs and focusing on protein. Patient discussed boost she was previously drinking causing constipation when she was drinking up to 4 a  day, discussed starting back with one a day, being mindful of drinking adequate water daily as she states that she does.  Patient has not weighed recently, her scales are still at her moms and plans to bring to her home, feels like she has made some progress.  Fall Risk No recent fall, using walker and has wheelchair available, with the help with sitters she has been working on exercises taught by therapist to get back to walking . Patient goal is to be able to walk to dining room at apartment when allowed.  Pain Patient reports improved pain control, recent steroid epidural injection on this week.   Plan  Will schedule return call to patient in the next month for, for progression of patient care goals.  Will update Eduard Clos , LCSW regarding patient call on today, also provided patient with Marcie Bal phone number.  THN CM Care Plan Problem One     Most Recent Value  Care Plan Problem One  High risk for readmission related to malnutrition , high fall risk   Role Documenting the Problem One  Care Management [place] for Problem One  Active  THN Long Term Goal   Patient will not experience a hospital readmission over the next 65 days  THN Long Term Goal Start Date  11/05/18  Interventions for Problem One Long Term Goal  Discussed current clinic state , positive reinforcement on meeting her goal of moving her apartment. reinforced to notfify MD of falls or increased pain .   THN CM Short Term Goal #1   Patient will report no falls over the next 30 days  [goal  adjusted ]  THN CM Short Term Goal #1 Start Date  12/18/18  Interventions for Short Term Goal #1  Reinforced use of walker, discussed fall measures/equipment in apartment.   THN CM Short Term Goal #2   Patient will be able to report weight gain of 1 pound over the next 30 days  [goal reset ]  THN CM Short Term Goal #2 Start Date  12/18/18  Interventions for Short Term Goal #2  Discussed with patient how monitoring weight  weekly will be able to provide positive reforcement with progress   THN CM Short Term Goal #3  Over the next 30 days patient will be able to report continuing to drink protien drink twice daily between meals   THN CM Short Term Goal #3 Start Date  11/05/18  [facility] CM Short Term Goal #3 Met Date  11/25/18  THN CM Short Term Goal #4  Over the next 30 days patient will be able to report eating something  at least every 2 to 3 hours.   THN CM Short Term Goal #4 Start Date  11/25/18  Interventions for Short Term Goal #4  Review of patient progress, on nutrition and intake, again reinforced protein and nutrition suggestions , praised patient progress   THN CM Short Term Goal #5   Patient will be able to report increase of distance of walking in apartment as evidenced by patient report over the next 30 days   THN CM Short Term Goal #5 Start Date  12/18/18  Interventions for Short Term Goal #5  Advised patient to continue with exercise plan with assistance daily ,use walker for safety .       Joylene Draft, RN, [place] Management Coordinator  [phone]- Mobile [phone]- Toll Free Main Office

## 2018-12-21 ENCOUNTER — Other Ambulatory Visit: Payer: Self-pay | Admitting: *Deleted

## 2018-12-21 ENCOUNTER — Telehealth: Payer: Self-pay

## 2018-12-21 NOTE — Patient Outreach (Signed)
[facility]) Care Management  12/21/2018  Aylla Bramel May 07, 1951 [id number]   CSW spoke with patient who reports she is staying at her new apartment during the days and then staying overnight at her mothers.  She indicates her plans to move in/stay overnight at her apartment are "on hold until I am walking better".  CSW also discussed plans for mental health support- she has not heard from anyone as of yet and is still interested in being linked with someone for emotional support".   CSW encouraged pt to continue working to get stronger, safer and mobile.   CSW will plan a follow up call in 7-10 days.   [name], MSW, LCSW Clinical Social Worker  [facility] [employer] [phone]

## 2018-12-21 NOTE — Telephone Encounter (Signed)
Patient called saying she wanted to know if she could get her "caregiver" to start giving her B12 injections instead of her having to come here.  Asked patient is the caregiver a CMA or RN. She said "No, she has been caregiving for a long time though."  Informed patient because she is not licensed to give injections, Dr B. Cannot give her consent to do so.   Patient then said she used to give herself B12 injections. Wants to know if she can give it to herself. Spoke with Dr Judithann Graves. Told patient to keep her appt coming up and we wills e if she can give to herself to give consent to give to herself at home.   APPT 01/06/2019

## 2018-12-28 ENCOUNTER — Ambulatory Visit: Payer: Medicare Other

## 2018-12-30 DIAGNOSIS — G5793 Unspecified mononeuropathy of bilateral lower limbs: Secondary | ICD-10-CM | POA: Diagnosis not present

## 2018-12-31 ENCOUNTER — Other Ambulatory Visit: Payer: Self-pay | Admitting: *Deleted

## 2018-12-31 DIAGNOSIS — G8929 Other chronic pain: Secondary | ICD-10-CM | POA: Diagnosis not present

## 2018-12-31 DIAGNOSIS — M545 Low back pain: Secondary | ICD-10-CM | POA: Diagnosis not present

## 2018-12-31 DIAGNOSIS — G629 Polyneuropathy, unspecified: Secondary | ICD-10-CM | POA: Diagnosis not present

## 2018-12-31 DIAGNOSIS — M810 Age-related osteoporosis without current pathological fracture: Secondary | ICD-10-CM | POA: Diagnosis not present

## 2018-12-31 NOTE — Patient Outreach (Signed)
[facility]) Care Management  12/31/2018  Katherine Baird 29-Apr-1951 [id number]  CSW spoke with pt by phone today. She was in good spirits; states she went to see her Rheumatologists today. "I am up to 85 pounds". She is still not sleeping at her new apartment but staying there during the days with caregiver support. She reports making progress with PT; "walking more and even outside in the sunshine too".  CSW praised her for all of this and pt said she is "mindfully eating" which is likely helping with her energy/fuel for walking and "thriving". CSW also talked with her about the correlation with eating, weight gain, walking/activity and other positive things impacting her overall mental health state as well. Her depression is lessened and she voices and sounds more happy, motivated and proud of her self. CSW encouraged her to continue all that she is doing and to reach out if needs arise. CSW will update [facility] team and PCP and plan follow up call in the next 2-3 weeks.   [name], MSW, LCSW Clinical Social Worker  [facility] [phone]      [name], MSW, LCSW Clinical Social Worker  [facility] [employer] [phone]

## 2019-01-04 DIAGNOSIS — M5136 Other intervertebral disc degeneration, lumbar region: Secondary | ICD-10-CM | POA: Diagnosis not present

## 2019-01-05 ENCOUNTER — Other Ambulatory Visit: Payer: Self-pay | Admitting: *Deleted

## 2019-01-05 NOTE — Patient Outreach (Signed)
[facility]) Care Management  [place]  01/05/2019   Priscila Bean [facility] [birth year] [id number]    Telephone follow up call.    ED visit 11/22 at [facility] ED Dx : Pyelonephritis , back pain  ED visit 11/24 at [facility] ED Dx: Abdominal pain, Constipation. Referral to [facility] spine center regarding T4 compression fracture, await phone call within a week regarding scheduling an appointment . Admission to [facility] 12/4- 12/10 Dx: Vomiting intractability, abdominal pain, dehydration, constipation, FTT,concern for depressive illnesschronic/remote Lacunar infarcts ED visit 12/06/18 Fall , skin tear right forearm .  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 States that she is finally seeing some results in mobility and nutrition.  Patient discussed feeling better about her nutrition and eating more since having the personal care assistance with her during the day.  Patient reports continuing to work on exercises in home with help of her personal care assistance, denies having a fall since 4/12 , discussed near fall when recently visiting her home, but her brother prevented her from falling.    Encounter Medications:  Outpatient Encounter Medications as of 01/05/2019  Medication Sig  . acetaminophen (TYLENOL) 500 MG tablet Take 500 mg by mouth every 8 (eight) hours as needed for moderate pain.  Marland Kitchen aspirin EC 81 MG tablet Take 81 mg by mouth daily.  . carvedilol (COREG) 6.25 MG tablet Take 6.25 mg by mouth 2 (two) times daily with a meal.   . clobetasol ointment (TEMOVATE) 4.09 % Apply 1  application topically 2 (two) times daily as needed (for irritation).   Marland Kitchen denosumab (PROLIA) 60 MG/ML SOSY injection Inject 60 mg into the skin every 6 (six) months.  . docusate sodium (COLACE) 100 MG capsule Take 100 mg by mouth daily as needed for mild constipation. Taking one to two a day  . Dupilumab 300 MG/2ML SOSY INJECT THE CONTENTS OF 1 SYRINGE (300MG) EVERY 2 WEEKS  . folic acid (FOLVITE) 1 MG tablet Take 1 mg by mouth daily.  Marland Kitchen gabapentin (NEURONTIN) 100 MG capsule Take 100 mg by mouth 4 (four) times daily.   Marland Kitchen levothyroxine (SYNTHROID, LEVOTHROID) 50 MCG tablet TAKE 1 TABLET BY MOUTH  DAILY  . mirtazapine (REMERON) 15 MG tablet Take 15 mg by mouth at bedtime.  . polyethylene glycol (MIRALAX / GLYCOLAX) packet Take 17 g by mouth daily as needed.   . traMADol (ULTRAM) 50 MG tablet Take 1 tablet (50 mg total) by mouth every 6 (six) hours as needed for moderate pain or severe pain. (Patient taking differently: Take 50 mg by mouth 3 (three) times daily. )   Facility-Administered Encounter Medications as of 01/05/2019  Medication  . cyanocobalamin ((VITAMIN B-12)) injection 1,000 mcg    Functional Status:  In your present state of health, do you have any difficulty performing the following activities: 01/05/2019 07/15/2018  Hearing? N N  Vision? N N  Difficulty concentrating or making decisions? [name]  Walking or climbing stairs? Y N  Comment uses walker, needs assistance with steps  chronic back pain   Dressing or bathing? [name]  Comment has caregiver that assist  -  Doing errands, shopping? [name]  Comment  family assist  -  Conservation officer, nature and eating ? [name]  Comment family assist  family assist   Using the Toilet? N N  In the past six months, have you accidently leaked urine? N N  Do you have problems with loss of bowel control? N N  Managing your Medications? [name]  Comment sister assist  -  Managing your Finances? Y N  Comment family helps  -  Housekeeping or managing your  Housekeeping? [name]  Comment family assist  family assisting  Some recent data might be hidden    Fall/Depression Screening: Fall Risk  11/25/2018 11/03/2018 09/22/2018  Falls in the past year? 1 0 0  Comment late entry for 4/1 - -  Number falls in past yr: 1 0 0  Injury with Fall? 1 0 0  Risk for fall due to : Impaired mobility;Impaired balance/gait;History of fall(s) History of fall(s);Impaired balance/gait;Impaired mobility -  Follow up Falls prevention discussed;Education provided Falls evaluation completed Falls evaluation completed   PHQ 2/9 Scores 11/03/2018 09/22/2018 09/03/2018 07/15/2018 04/06/2018 01/13/2018 09/19/2017  PHQ - 2 Score 0 0 0 2 0 0 0  PHQ- 9 Score - - - 9 - - -    Assessment:   Fall, High fall risk No fall in last 30 days, daily working on home exercises and using walker  Nutrition Reported weight up to 85 lbs at recent medical appointment,(same reported weight for the last month)  no  does not have scales her apartment agreeable to bringing scale to her place. Reports improved intake since  with having support from care givers that bring food to home and reminders about eating. Importance of nutrition, discussed available protein sources and daily intake.  Pain Patient discussed some overall improvement with pain over the last few months, reports recent increase of gabapentin after physiatrist telehealth  visit on yesterday.  Social Patient continues to stay at her senior apartment during the day about 8 hours and then return to her mothers home for overnight. Patient discussed goal of being able to stay at her apartment full time . Patient discussed conversation with her sister  about being able to talk with a counselor, but unsure how to go about it, she states understanding it will be over the phone.   Appointments Dr.Berglung on 5/13- follow up b12 Dr. Elvina Mattes on 5/13 for nail trim   Plan Will communicate with LCSW Eduard Clos regarding patient interest in  speaking with a counselor, Will plan follow up phone call in the next 3 week, will assess for ongoing complex care management needs,  Will send PCP visit note.    THN CM Care Plan Problem One     Most Recent Value  Care Plan Problem One  High risk for readmission related to malnutrition , high fall risk   Role Documenting the Problem One  Care Management [place] for Problem One  Active  THN Long Term Goal   Patient will not experience a hospital readmission over the next 75 days [goal updated ]  THN Long Term Goal Start Date  11/05/18  Interventions for Problem One Long Term Goal  Reviewed current clinical state, progress reinforced fall prevention, nutriition importance and how it effect progress. reviewed upcoming appointments and transportation.   THN CM Short Term Goal #1   Patient will report no falls over the next 30 days  [goal adjusted ]  THN CM Short Term Goal #1 Start Date  12/18/18  Interventions  for Short Term Goal #1  Review of recent fall episode, reinforced continued use of walker, and home exercise plan   THN CM Short Term Goal #2   Patient will be able to report weight gain of 1 pound over the next 30 days  [goal reset ]  THN CM Short Term Goal #2 Start Date  12/18/18  Interventions for Short Term Goal #2  Reviewed importance of monitoring weights  at home and  having scales available for weighing and how this helps with checking her progress   THN CM Short Term Goal #3  Over the next 30 days patient will be able to report continuing to drink protien drink twice daily between meals   THN CM Short Term Goal #3 Start Date  11/05/18  [facility] CM Short Term Goal #3 Met Date  11/25/18  THN CM Short Term Goal #4  Over the next 30 days patient will be able to report eating something  at least every 2 to 3 hours.   THN CM Short Term Goal #4 Start Date  11/25/18  [facility] CM Short Term Goal #4 Met Date  01/05/19  Interventions for Short Term Goal #4  Review of patient progress,  on nutrition and intake, again reinforced protein and nutrition suggestions , praised patient progress   THN CM Short Term Goal #5   Patient will be able to report increase of distance of walking in apartment as evidenced by patient report over the next 30 days   THN CM Short Term Goal #5 Start Date  12/18/18  Interventions for Short Term Goal #5  Reinforced importance of continued plan of working with caregivers, positive reinforcment for progress       Joylene Draft, RN, [place] Management Coordinator  [phone]- Mobile [phone]- [place]

## 2019-01-06 ENCOUNTER — Other Ambulatory Visit: Payer: Self-pay

## 2019-01-06 ENCOUNTER — Encounter: Payer: Self-pay | Admitting: Internal Medicine

## 2019-01-06 ENCOUNTER — Ambulatory Visit (INDEPENDENT_AMBULATORY_CARE_PROVIDER_SITE_OTHER): Payer: Medicare Other | Admitting: Internal Medicine

## 2019-01-06 VITALS — BP 122/80 | HR 69 | Ht 59.0 in | Wt 83.4 lb

## 2019-01-06 DIAGNOSIS — E538 Deficiency of other specified B group vitamins: Secondary | ICD-10-CM | POA: Diagnosis not present

## 2019-01-06 DIAGNOSIS — F39 Unspecified mood [affective] disorder: Secondary | ICD-10-CM | POA: Diagnosis not present

## 2019-01-06 DIAGNOSIS — R29898 Other symptoms and signs involving the musculoskeletal system: Secondary | ICD-10-CM

## 2019-01-06 DIAGNOSIS — E43 Unspecified severe protein-calorie malnutrition: Secondary | ICD-10-CM

## 2019-01-06 DIAGNOSIS — M79674 Pain in right toe(s): Secondary | ICD-10-CM | POA: Diagnosis not present

## 2019-01-06 DIAGNOSIS — B351 Tinea unguium: Secondary | ICD-10-CM | POA: Diagnosis not present

## 2019-01-06 DIAGNOSIS — M79675 Pain in left toe(s): Secondary | ICD-10-CM | POA: Diagnosis not present

## 2019-01-06 MED ORDER — "SYRINGE/NEEDLE (DISP) 25G X 5/8"" 1 ML MISC": 1.0000 | 0 refills | Status: DC

## 2019-01-06 MED ORDER — CYANOCOBALAMIN 1000 MCG/ML IJ SOLN: 1000.0000 ug | Freq: Once | INTRAMUSCULAR | 12 refills | Status: AC

## 2019-01-06 MED ORDER — CYANOCOBALAMIN 1000 MCG/ML IJ SOLN
1000.0000 ug | Freq: Once | INTRAMUSCULAR | Status: AC
  Administered 2019-01-06: 1000 ug via INTRAMUSCULAR

## 2019-01-06 NOTE — Progress Notes (Signed)
Date:  01/06/2019   Name:  Katherine Baird   DOB:  01/04/1951   MRN:  [id number]   Chief Complaint: b12 def; Weight Loss; and Depression  Depression         This is a chronic problem.  The problem occurs rarely.  Associated symptoms include no fatigue and no headaches.  Past treatments include other medications.  Compliance with treatment is good.  Previous treatment provided moderate relief.  B12 def - getting injections every month.  Has a certified CNA who is caring for her daily who can administer these since it is hard for her to get to the office monthly.  Weight loss - despite trying to eat better, she has lost 2 more pounds.  She says she can not tolerate Ensure due to milk base and constipation.    Debility - she is trying to move into a senior living apartment but after her last fall she has lost more strength and endurance.  She uses a wheelchair most of the time.  She is working with two aids to do more exercise and increase her strength.  Her legs are very weak and her previous HNP and RA continue to limit her.  Review of Systems  Constitutional: Negative for chills, fatigue, fever and unexpected weight change.  HENT: Negative for trouble swallowing.   Respiratory: Negative for shortness of breath and wheezing.   Cardiovascular: Negative for chest pain, palpitations and leg swelling.  Gastrointestinal: Positive for nausea. Negative for abdominal pain.  Musculoskeletal: Positive for arthralgias, back pain and gait problem.  Neurological: Positive for weakness. Negative for dizziness, syncope and headaches.  Psychiatric/Behavioral: Positive for depression. Negative for sleep disturbance. The patient is not nervous/anxious.     Patient Active Problem List   Diagnosis Date Noted  . Vitamin B12 nutritional deficiency 11/03/2018  . Age-related osteoporosis with current pathological fracture of vertebra with routine healing 09/30/2018  . Personal history of fall 09/30/2018  .  HNP (herniated nucleus pulposus), thoracic 09/22/2018  . Paroxysmal atrial fibrillation (HCC) 08/28/2018  . Mood disorder (HCC) 08/10/2018  . Severe protein-calorie malnutrition (HCC) 08/10/2018  . Late effect of lacunar infarction 08/10/2018  . Takotsubo cardiomyopathy 08/10/2018  . Constipation 07/30/2018  . Bladder infection 07/14/2018  . Old myocardial infarction 07/02/2018  . Iron deficiency anemia 02/08/2018  . Fatigue 01/13/2018  . Chronic diastolic heart failure (HCC) 12/17/2017  . 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 Syncope 02/24/2017  . Hypothyroidism 05/24/2016  . HLD (hyperlipidemia) 01/27/2015  . Paroxysmal digital cyanosis 01/27/2015  . Essential (primary) hypertension 01/11/2015  . Rheumatoid arthritis involving multiple joints (HCC) 01/11/2015  . 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 Levofloxacin Nausea And Vomiting  . Doxycycline   . Erythromycin   . Gabapentin   . Guaifenesin     "Felt sick" and worsening of symptoms  . Nitrofuran Derivatives   . Prednisone     muscle weakness/pain  . Sulfa Antibiotics   . Tetracycline   . Azithromycin Rash    Severe rash with peeling  . Carvedilol Other (See Comments)    Dose problem with higher doses    Past Surgical History:  Procedure Laterality Date  . LAPAROSCOPIC HYSTERECTOMY  2001   total  . LEFT HEART CATH N/A 07/03/2018   Procedure: Left Heart Cath with Coronary Angiography;  Surgeon: Marcina MillardParaschos, Alexander, MD;  Location: [facility] INVASIVE CV LAB;  Service: Cardiovascular;  Laterality: N/A;  Social History   Tobacco Use  . Smoking status: Never Smoker  . Smokeless tobacco: Never Used  Substance Use Topics  . Alcohol use: No    Alcohol/week: 0.0 standard drinks  . Drug use: No     Medication list has been reviewed and updated.  Current Meds  Medication Sig  . acetaminophen (TYLENOL) 500 MG tablet Take 500 mg by mouth every 8 (eight) hours as needed for  moderate pain.  Marland Kitchen aspirin EC 81 MG tablet Take 81 mg by mouth daily.  . carvedilol (COREG) 6.25 MG tablet Take 6.25 mg by mouth 2 (two) times daily with a meal.   . clobetasol ointment (TEMOVATE) 0.05 % Apply 1 application topically 2 (two) times daily as needed (for irritation).   Marland Kitchen denosumab (PROLIA) 60 MG/ML SOSY injection Inject 60 mg into the skin every 6 (six) months.  . docusate sodium (COLACE) 100 MG capsule Take 100 mg by mouth daily as needed for mild constipation. Taking one to two a day  . Dupilumab 300 MG/2ML SOSY INJECT THE CONTENTS OF 1 SYRINGE (300MG ) EVERY 2 WEEKS  . folic acid (FOLVITE) 1 MG tablet Take 1 mg by mouth daily.  Marland Kitchen gabapentin (NEURONTIN) 100 MG capsule Take 100 mg by mouth 4 (four) times daily.   Marland Kitchen levothyroxine (SYNTHROID, LEVOTHROID) 50 MCG tablet TAKE 1 TABLET BY MOUTH  DAILY  . mirtazapine (REMERON) 15 MG tablet Take 15 mg by mouth at bedtime.  . polyethylene glycol (MIRALAX / GLYCOLAX) packet Take 17 g by mouth daily as needed.   . traMADol (ULTRAM) 50 MG tablet Take 1 tablet (50 mg total) by mouth every 6 (six) hours as needed for moderate pain or severe pain. (Patient taking differently: Take 50 mg by mouth 3 (three) times daily. )   Current Facility-Administered Medications for the 01/06/19 encounter (Office Visit) with Reubin Milan, MD  Medication  . cyanocobalamin ((VITAMIN B-12)) injection 1,000 mcg  . cyanocobalamin ((VITAMIN B-12)) injection 1,000 mcg    PHQ 2/9 Scores 01/06/2019 11/03/2018 09/22/2018 09/03/2018  PHQ - 2 Score 0 0 0 0  PHQ- 9 Score - - - -    BP Readings from Last 3 Encounters:  01/06/19 122/80  12/06/18 123/75  11/03/18 98/60    Physical Exam Vitals signs and nursing note reviewed.  Constitutional:      General: She is not in acute distress.    Appearance: She is well-developed. She is cachectic.  HENT:     Head: Normocephalic and atraumatic.  Cardiovascular:     Rate and Rhythm: Normal rate and regular rhythm.   Pulmonary:     Effort: Pulmonary effort is normal. No respiratory distress.     Breath sounds: Normal breath sounds and air entry.  Musculoskeletal: Normal range of motion.     Comments: 4/5 strength in both LE -   Skin:    General: Skin is warm and dry.     Findings: No rash.  Neurological:     Mental Status: She is alert and oriented to person, place, and time.  Psychiatric:        Attention and Perception: Attention normal.        Mood and Affect: Mood normal.        Behavior: Behavior normal.        Thought Content: Thought content normal.     Wt Readings from Last 3 Encounters:  01/06/19 83 lb 6.4 oz (37.8 kg)  12/06/18 79 lb (35.8 kg)  11/03/18  85 lb (38.6 kg)    BP 122/80   Pulse 69   Ht 4\' 11"  (1.499 m)   Wt 83 lb 6.4 oz (37.8 kg)   SpO2 100%   BMI 16.84 kg/m   Assessment and Plan: 1. B12 deficiency Continue monthly B12 - cyanocobalamin ((VITAMIN B-12)) injection 1,000 mcg  2. Mood disorder (HCC) Doing well on Remeron  3. Severe protein-calorie malnutrition (HCC) Discussed the need to increase protein intake  4. Vitamin B12 nutritional deficiency - cyanocobalamin (,VITAMIN B-12,) 1000 MCG/ML injection; Inject 1 mL (1,000 mcg total) into the muscle once for 1 dose.  Dispense: 1 mL; Refill: 12 - SYRINGE/NEEDLE, DISP, 1 ML (BD INTEGRA SYRINGE) 25G X 5/8" 1 ML MISC; 1 each by Does not apply route every 30 (thirty) days.  Dispense: 50 each; Refill: 0  5. Muscular deconditioning Continue exercise daily Recommend pursuing Assisted Living for more supervision   Partially dictated using Dragon software. Any errors are unintentional.  Bari Edward, MD [facility]  01/06/2019

## 2019-01-15 ENCOUNTER — Other Ambulatory Visit: Payer: Self-pay | Admitting: *Deleted

## 2019-01-15 NOTE — Patient Outreach (Signed)
[facility]) Care Management  01/15/2019  Katherine Baird 07-11-1951 [id number]   CSW made contact with pt by phone today. She reports she is still only staying at her new apartment during the day while she continues to get therapy and work to get stronger. CSW talked with her about potential ALF option but she remains adamant about plans as they are.  CSW offered support and encouragement. CSW will update [facility] RNCM and plan a followup call in the next 2 weeks.  [name], MSW, LCSW Clinical Social Worker  [facility] [employer] [employer] [phone]

## 2019-01-19 ENCOUNTER — Other Ambulatory Visit: Payer: Self-pay | Admitting: *Deleted

## 2019-01-19 NOTE — Patient Outreach (Signed)
[facility]) Care Management  01/19/2019  Tawsha Vangorp 09-27-1950 [id number]   Telephone assessment ED visit 11/22 at [facility] ED Dx : Pyelonephritis , back pain  ED visit 11/24 at [facility] ED Dx: Abdominal pain, Constipation. Referral to [facility] spine center regarding T4 compression fracture, await phone call within a week regarding scheduling an appointment . Admission to [facility] 12/4- 12/10 Dx: Vomiting intractability, abdominal pain, dehydration, constipation, FTT,concern for depressive illnesschronic/remote Lacunar infarcts ED visit 12/06/18 Fall , skin tear right forearm .  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   Plan Will pan return call in the next 3 business days.    [name], RN, [facility] Care Management,Care Management Coordinator  [phone]- Mobile [phone]- Toll Free Main Office

## 2019-01-22 ENCOUNTER — Other Ambulatory Visit: Payer: Self-pay | Admitting: *Deleted

## 2019-01-22 ENCOUNTER — Telehealth: Payer: Self-pay

## 2019-01-22 NOTE — Telephone Encounter (Signed)
She can not get just [facility] OT and PT without needed nursing services.  If she has a home health agency that she used before, let me know but I can not find one in her chart.

## 2019-01-22 NOTE — Telephone Encounter (Signed)
Katherine Baird from [facility] said she spoke with patient and patient would like having home PT evaluating resumed for her problem with balance.  Please advise? Unsure how to resume this.

## 2019-01-22 NOTE — Patient Outreach (Signed)
[facility]) Care Management  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  ED visit 11/22 at [facility] ED Dx : Pyelonephritis , back pain  ED visit 11/24 at [facility] ED Dx: Abdominal pain, Constipation. Referral to [facility] spine center regarding T4 compression fracture, await phone call within a week regarding scheduling an appointment . Admission to [facility] 12/4- 12/10 Dx: Vomiting intractability, abdominal pain, dehydration, constipation, FTT,concern for depressive illnesschronic/remote Lacunar infarcts ED visit 12/06/18 Fall , skin tear right forearm .Marland Kitchen  Subjective:   Successful outreach call to patient, she discussed feeling really good.  Patient discussed having more strength, and walking better. She reports her balance and neuropathy limits her mobility. She reports that the personal care aids staying with her have been very helpful,assisting with shower and meals.  Patient discussed recent PCP visit and discussion on Assisted living for more supervision . Patient enjoys being at her new apartment and having the current personal care assistance.     Objective:   Encounter Medications:  Outpatient Encounter Medications as of 01/22/2019  Medication Sig  . acetaminophen (TYLENOL) 500 MG tablet Take 500 mg by mouth every 8 (eight) hours as needed for moderate pain.  Marland Kitchen aspirin EC 81 MG tablet Take 81 mg by mouth daily.  . carvedilol (COREG) 6.25 MG tablet Take 6.25 mg by mouth 2 (two) times daily with a meal.   . clobetasol ointment (TEMOVATE) 0.05 % Apply 1 application  topically 2 (two) times daily as needed (for irritation).   Marland Kitchen denosumab (PROLIA) 60 MG/ML SOSY injection Inject 60 mg into the skin every 6 (six) months.  . docusate sodium (COLACE) 100 MG capsule Take 100 mg by mouth daily as needed for mild constipation. Taking one to two a day  . Dupilumab 300 MG/2ML SOSY INJECT THE CONTENTS OF 1 SYRINGE (300MG ) EVERY 2 WEEKS  . folic acid (FOLVITE) 1 MG tablet Take 1 mg by mouth daily.  Marland Kitchen gabapentin (NEURONTIN) 100 MG capsule Take 100 mg by mouth 4 (four) times daily.   Marland Kitchen levothyroxine (SYNTHROID, LEVOTHROID) 50 MCG tablet TAKE 1 TABLET BY MOUTH  DAILY  . mirtazapine (REMERON) 15 MG tablet Take 15 mg by mouth at bedtime.  . polyethylene glycol (MIRALAX / GLYCOLAX) packet Take 17 g by mouth daily as needed.   . SYRINGE/NEEDLE, DISP, 1 ML (BD INTEGRA SYRINGE) 25G X 5/8" 1 ML MISC 1 each by Does not apply route every 30 (thirty) days.  . traMADol (ULTRAM) 50 MG tablet Take 1 tablet (50 mg total) by mouth every 6 (six) hours as needed for moderate pain or severe pain. (Patient taking differently: Take 50 mg by mouth 3 (three) times daily. )   Facility-Administered Encounter Medications as of 01/22/2019  Medication  . cyanocobalamin ((VITAMIN B-12)) injection 1,000 mcg    Functional Status:  In your present state of health, do you have any difficulty performing the following activities: 01/05/2019 07/15/2018  Hearing? N N  Vision? N N  Difficulty concentrating or making decisions? Malvin Johns  Walking or climbing stairs? Y N  Comment uses walker, needs assistance with steps  chronic back pain   Dressing or bathing?  Malvin Johns  Comment has caregiver that assist  -  Doing errands, shopping? Malvin Johns  Comment family assist  -  Preparing Food and eating ? Malvin Johns  Comment family assist  family assist   Using the Toilet? N N  In the past six months, have you accidently leaked urine? N N  Do you have problems with loss of bowel control? N N  Managing your Medications? Malvin Johns  Comment  sister assist  -  Managing your Finances? Y N  Comment family helps  -  Housekeeping or managing your Housekeeping? Malvin Johns  Comment family assist  family assisting  Some recent data might be hidden    Fall/Depression Screening: Fall Risk  01/06/2019 11/25/2018 11/03/2018  Falls in the past year? 1 1 0  Comment - late entry for 4/1 -  Number falls in past yr: 1 1 0  Injury with Fall? 0 1 0  Risk for fall due to : History of fall(s);Impaired balance/gait;Impaired mobility Impaired mobility;Impaired balance/gait;History of fall(s) History of fall(s);Impaired balance/gait;Impaired mobility  Follow up - Falls prevention discussed;Education provided Falls evaluation completed   PHQ 2/9 Scores 01/06/2019 11/03/2018 09/22/2018 09/03/2018 07/15/2018 04/06/2018 01/13/2018  PHQ - 2 Score 0 0 0 0 2 0 0  PHQ- 9 Score - - - - 9 - -    Assessment:   High fall risk  Continuing with daily exercise plan with help from personal care assistants, patient does not have have health at this time and questions if it can be resumed.  No recent falls , high fall risk due to neuropathy and balance,requiring assistance with ADL's .  Nutrition  No weigh loss, improved nutrition intake, personal care assistance providing some meals and supporting patient with nutrition intake. Reinforced importance on balanced meals, including protein and admits focusing on that and has come a long way. Does not have scales at her apartment but will ask family to bring.   Social Patient staying at apartment during the day and return to her moms home in the evening. Patient discussed that she is still not steady enough to stay by herself at night.  Patient discussed that her sister is planning to come into town in the next week, she is agreeable that I follow up with [name]. Placed call to [name] she discussed recent conversation with her other sister Olegario Messier, MD recommendations for ALF, family wants patient to make decision about, pursuing ALF or  staying at her current apartment with personal care help she currently has.[name] has discussed looking into getting overnight help to stay with patient so that she can remain in her current apartment . Patient sister asking about home therapy for patient .    Discussed embedded case management program at her PCP office with our [facility] care manager, that will continue follow patient for education and support, and plan for  transition in the near future.   Appointment  [facility]  6/24    Plan:  Will plan follow up call in the next month, with transition to Embedded practice for  chronic care management. Placed call to PCP, Dr Judithann Graves office to inquire regarding home physical orders per patient request due to increased balance mobility  issues, able to leave message on nurse line.     [name], RN, [facility] Care Management,Care Management Coordinator  [phone]- Mobile [phone]- Toll Free Main Office

## 2019-01-22 NOTE — Patient Outreach (Signed)
[facility]) Care Management  01/22/2019  Katherine Baird 14-May-1951 [id number]   CSW spoke with pt who reports she feels "the ball was dropped and no one is advocating for me".  She does not understand why HHPT ended; she feels she still needs it. During conversation with her to explain possible reasons it might end, she mentioned the difficulty she has with writing, etc because of her "neuropathy". CSW suggested to pt that she call her PCP and ask for PT and OT services to be ordered. Also, suggested she might consider outpatient therapies and to call the [facility] agency (Provided # for her) that was serving her and inquire about why her services ended.   CSW will plan to touch base with her next week for updates.  CSW offered support and applauded her for being her own advocate and mentioning her concerns today.  CSW will update PCP and [facility] RNCM as well.    [name], MSW, LCSW Clinical Social Worker  [facility] [employer] [phone]

## 2019-01-26 ENCOUNTER — Other Ambulatory Visit: Payer: Self-pay | Admitting: *Deleted

## 2019-01-26 NOTE — Patient Outreach (Addendum)
[facility]) Care Management  01/26/2019  Notnamed Croucher 1951/02/12 [id number]   CSW spoke with patient by phone who states she is still not getting anywhere with people helping to advocate for her.  CSW inquired if she had made the phone calls I had suggested during last conversation to see about resuming the [facility] therapy. She states she has called the [facility] agency and they had terminated her services she had met the goals. She continues to do physical therapy by way of her own exercise and assistance (paid privately). She continues to plan to move into her apartment and upon further inquiry says she hopes to arrange for additional care support in the next week. CSW encouraged her to do so and will check in next week for updates.  CSW also discussed with her getting set up with a counselor near her apartment for face to face visits now that [place] has lifted restrictions.  CSW will work to find a mental health provider nearby and update pt.     Eduard Clos, MSW, [place] Worker  [place] [phone]

## 2019-01-27 ENCOUNTER — Other Ambulatory Visit: Payer: Self-pay | Admitting: *Deleted

## 2019-01-27 NOTE — Patient Outreach (Signed)
[facility]) Care Management  01/27/2019  Kamori Feeser July 11, 1951 [id number]   Care coordination   Outreach call to patient to follow up on her request for home health physical therapy to help with balance . Discussed patient call patient was placed to MD office and follow up message sent today,to update office on agency patient has used in the past.   Discussed with patient home health therapy is not an ongoing service, they will work on goals toward improving safety mobility in home. Patient voiced understanding stating previously she had reached goal. Discussed with patient falls safety risk will be a concern due to neuropathy and her functional limits.  Patient discussed having 8 hours of personal care support while she is at her apartment and her sister if looking into additional support so that patient can remain in her apartment 24 hours.    Plan  Received follow up message from Chassidy at Dr. Judithann Graves office , that MD considering outpatient therapy if patient has transportation to visit.  Attempted return call to patient, no answer able to leave a message for return call. If no return call today will plan return call in the next 2 business days.    [name], RN, [facility] Care Management,Care Management Coordinator  [phone]- Mobile [phone]- Toll Free Main Office

## 2019-01-29 ENCOUNTER — Other Ambulatory Visit: Payer: Self-pay | Admitting: *Deleted

## 2019-01-29 NOTE — Patient Outreach (Signed)
[facility]) Care Management  01/29/2019  Cove Igleheart [facility] 01-03-1951 [id number]  Late entry from 6/4.  Patient returned call from attempted outreach on 6/3.  Explained to patient communication with [name] office that is agreeable to orders for outpatient physical therapy if patient has  transportation to appointments. Patient wants to consider this to see if her current personal care assistants would be able to travel with her. She understands that she has a transportation benefit with her insurance plan.  Patient request return call at the 1st of next week for follow up .   Plan Will reschedule follow up call on next business day.   [name], RN, [facility] Care Management,Care Management Coordinator  [phone]- Mobile [phone]- Toll Free Main Office

## 2019-02-01 ENCOUNTER — Other Ambulatory Visit: Payer: Self-pay | Admitting: *Deleted

## 2019-02-01 NOTE — Patient Outreach (Addendum)
[place] [facility]) Care Management  02/01/2019  Katherine Baird [facility] 11/24/1950 [id number]   Telephone assessment  Care coordination .  PMHx noted : significant for Hypertension , Diastolic Heart failure, recent UTI , back pain,iron deficiency , osteoporosis  ED visits 11/3 Dx: Chest pain  ED visit 11/22 at [facility] ED Dx : Pyelonephritis , back pain  ED visit 11/24 at [facility] ED Dx: Abdominal pain, Constipation. Referral to [facility] spine center regarding T4 compression fracture, await phone call within a week regarding scheduling an appointment . Admission to [facility] 12/4- 12/10 Dx: Vomiting intractability, abdominal pain, dehydration, constipation, FTT,concern for depressive illnesschronic/remote Lacunar infarcts  Outreach call to patient to follow up previous communication regarding Dr. Army Melia ordering outpatient home health if patient has transportation to appointment.  Patient states that this is not a good time to talk, she has company she is agreeable to returning call later today.   Patient returned call to me ,discussed upon follow up on outpatient therapy. Patient discussed not interested at this time. States will work best for her if therapy came to her home. She is aware of transportation benefit of her insurance plan but wants to wait a till a later in year to use.  She discussed plans for consult with [name] in the next week to see if there is anymore that can be done for her regarding her balance, she has been patient provider in the past.   Fall risk/balance problem  Patient discussed her concern regarding condition with neuropathy and balance and she doesn't want to stay like this she wants to get stronger . She discussed understanding of neuropathy symptoms of imbalance, hand numbness. Patient is eager for support for help with improving symptoms and health.  Social  Patient and family continue to work on getting addition support in the home for 24  hours support until she she gets stronger. Patient voiced being determined to seek options to improve.   Severe MalNutrition  Patient discussed continuing to work on improving nutrition, stating eating the most she has a while, appreciating the support she has at home,  reinforced education on balanced  nutrition, protein intake to help improve physical condition . Commended patient on her progress.   Appointments [name] , June 16 Dr. Nehemiah Massed 6/24.   Plan  Will plan telephone outreach call in the next 3 weeks for assessment of care management needs, goals for continued follow up plans  Patient agreeable to me following up with her sister in town , Attempted telephone outreach to patient POA Katherine Baird. Unsuccessful able to leave a message.    Joylene Draft, RN, [place] Management Coordinator  [phone]- Mobile [phone]- Toll Free Main Office

## 2019-02-02 ENCOUNTER — Other Ambulatory Visit: Payer: Self-pay | Admitting: *Deleted

## 2019-02-02 NOTE — Patient Outreach (Signed)
[place] [facility]) Care Management  02/02/2019  Sapir Lavey [birth year] [id number]  Late call from 6/8   Received return call from patient sister, Higinio Plan.  She discussed patient gets a little down regarding her condition and needs all the positive reinforcement and support . She discussed patient still needs assistance with daily care, due to general weakness , difficulty  holding items in her hands due to neuropathy, she still requires assistance with bathing and dressing and has personal care assistant available to help during the day.  Patient sister  states that it is  patient goal to stay in her apartment 24 hours , and family is helping by arranging someone to stay with patient at night. Neoma Laming states that this will patient first night staying at her apartment 24 hours and they will see how it goes, this will be the patient first night not staying her moms home in a few years since the death of her spouse.  Family is still available to provide transportation for visits to her mom's and other assistance as needed.   Discussed with Neoma Laming follow up with [name] office regarding agreeable to outpatient therapy if transportation can be arranged. I let Neoma Laming know patient has declined at this time, and agrees transportation will be a problem and patient understands that she has transportation benefit with her insurance plan but does not want to use that at this time. She states that she understands patient decision .  She states that patient  is hopeful for  additional support or treatment options after her appointment on next week.     Plan Will send return inbasket message to [place] at [facility] office as patient has declined outpatient therapy at this time.  Will plan follow up call to patient within the next 3 weeks.  Will update Eduard Clos [facility] LCSW.    Joylene Draft, RN, [place] Management Coordinator  [phone]-  Mobile [phone]- Toll Free Main Office

## 2019-02-03 ENCOUNTER — Other Ambulatory Visit: Payer: Self-pay | Admitting: *Deleted

## 2019-02-03 ENCOUNTER — Telehealth: Payer: Self-pay

## 2019-02-03 NOTE — Telephone Encounter (Signed)
-----   Message from Alfonzo Feller, RN sent at 02/02/2019  4:28 PM EDT ----- Regarding: RE: [facility] agency patient has used in the past . Hi Ivadell Gaul, I followed up with patient and she is declining outpatient therapy at this time, stating getting to appointment will be a challenge for her at this time. She wants to wait on using her insurance transportation benefit.   Thank you .   Joylene Draft, RN, [place] Management Coordinator  [phone]- Mobile [phone]- Toll Free Main Office   ----- Message ----- From: Clista Bernhardt, CMA Sent: 01/27/2019   1:18 PM EDT To: Alfonzo Feller, RN Subject: FW: [place] agency patient has used in the past .   I spoke with Dr. Army Melia and she cannot just receive PT. She has to have nurses services first. She could go to PT if someone will take her but that is all per Dr. Army Melia.'  Thank you.  ----- Message ----- From: Alfonzo Feller, RN Sent: 01/27/2019  11:26 AM EDT To: Clista Bernhardt, CMA Subject: [place] agency patient has used in the past .       Hi Ezariah Nace,   If [name] is considering home health for patient , she has used [facility] in the past.  She continues to ask  for home health PT/OT due to reported increased balance concerns since her fall in April. She understands that therapy follow up will based on their evaluation.   Joylene Draft, RN, [place] Management Coordinator  [phone]- Mobile [phone]- Toll Free Main Office

## 2019-02-03 NOTE — Patient Outreach (Signed)
[place]) Care Management  02/03/2019  Katherine Baird October 24, 1950 [id number]   CSW made contact with pt who reports she has stayed overnight a few nights in her new apartment. She also has arranged for the 24/7 caregiver support and states all is going well.  Pt still hopeful to get HHPT and OT to aide her in achieving adaptations/devices that will support her independence.  CSW advised pt of awaiting callback from mental health counseling support options near her home and will plan to update her when contact is made.   Support and encouragement provided.  Eduard Clos, MSW, [place] Worker  [place] [phone]

## 2019-02-03 NOTE — Telephone Encounter (Signed)
FYI-  See Below from Staff message.

## 2019-02-09 DIAGNOSIS — E079 Disorder of thyroid, unspecified: Secondary | ICD-10-CM | POA: Diagnosis not present

## 2019-02-09 DIAGNOSIS — M359 Systemic involvement of connective tissue, unspecified: Secondary | ICD-10-CM | POA: Diagnosis not present

## 2019-02-09 DIAGNOSIS — I10 Essential (primary) hypertension: Secondary | ICD-10-CM | POA: Diagnosis not present

## 2019-02-09 DIAGNOSIS — E063 Autoimmune thyroiditis: Secondary | ICD-10-CM | POA: Diagnosis not present

## 2019-02-09 DIAGNOSIS — M545 Low back pain: Secondary | ICD-10-CM | POA: Diagnosis not present

## 2019-02-09 DIAGNOSIS — E43 Unspecified severe protein-calorie malnutrition: Secondary | ICD-10-CM | POA: Diagnosis not present

## 2019-02-09 DIAGNOSIS — G629 Polyneuropathy, unspecified: Secondary | ICD-10-CM | POA: Diagnosis not present

## 2019-02-09 DIAGNOSIS — R262 Difficulty in walking, not elsewhere classified: Secondary | ICD-10-CM | POA: Diagnosis not present

## 2019-02-09 DIAGNOSIS — L309 Dermatitis, unspecified: Secondary | ICD-10-CM | POA: Diagnosis not present

## 2019-02-09 DIAGNOSIS — R5383 Other fatigue: Secondary | ICD-10-CM | POA: Diagnosis not present

## 2019-02-09 DIAGNOSIS — D508 Other iron deficiency anemias: Secondary | ICD-10-CM | POA: Diagnosis not present

## 2019-02-09 DIAGNOSIS — E7849 Other hyperlipidemia: Secondary | ICD-10-CM | POA: Diagnosis not present

## 2019-02-09 DIAGNOSIS — E538 Deficiency of other specified B group vitamins: Secondary | ICD-10-CM | POA: Diagnosis not present

## 2019-02-11 ENCOUNTER — Ambulatory Visit: Payer: Self-pay | Admitting: *Deleted

## 2019-02-11 ENCOUNTER — Other Ambulatory Visit: Payer: Self-pay | Admitting: *Deleted

## 2019-02-11 NOTE — Patient Outreach (Signed)
[place] [facility]) Care Management  02/11/2019  Unnamed Hino [birth year] [id number]  CSW was unable to make contact with pt and left a HIPPA compliant voice message.  CSW will outreach pt again in 3-4 business days if no return call is received.      Eduard Clos, MSW, [place] Worker  Central [phone]

## 2019-02-15 ENCOUNTER — Other Ambulatory Visit: Payer: Self-pay | Admitting: *Deleted

## 2019-02-16 NOTE — Patient Outreach (Signed)
[facility]) Care Management  02/16/2019  Altamese Deguire [birth year] [id number]   CSW spoke with pt by phone who reports things are going well at her new apartment. She has around the clock private duty care and this arrangement is working out to her satisfaction.  She reports seeing a new PCP who is arranging for PT/OT to resume. Pt reports she has lost weight and they are working on that via her PCP.   Pt denies any concerns or needs at this time. CSW working to help find pt a Licensed conveyancer for in person visits once COVID restrictions allow.   CSW will plan a follow up call in 2 weeks.   Eduard Clos, MSW, [place] Worker  [place] [phone]

## 2019-02-17 ENCOUNTER — Other Ambulatory Visit: Payer: Self-pay | Admitting: *Deleted

## 2019-02-17 NOTE — Patient Outreach (Addendum)
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  ED visit 11/22 at [facility] ED Dx : Pyelonephritis , back pain  ED visit 11/24 at [facility] ED Dx: Abdominal pain, Constipation. Referral to [facility] spine center regarding T4 compression fracture, await phone call within a week regarding scheduling an appointment . Admission to [facility] 12/4- 12/10 Dx: Vomiting intractability, abdominal pain, dehydration, constipation, FTT,concern for depressive illnesschronic/remote Lacunar infarcts ED visit 12/06/18 Fall , skin tear right forearm .Marland Kitchen   Successful outreach call to patient , she reports feeling okay, but she sounded hesitant in her voice then she discussed having a fall on this morning .  Patient states that she was getting up from the commode , and her left leg gave away and she slipped to the floor landing on her bottom, with her left leg bent back, she denies dizziness or hitting her head.  Patient caregiver present and with 2nd assistant they where able to get her to the wheelchair. Patient reports resting on the couch with chair elevated patient states that she is able to lift leg up, she denies redness, swelling,  no broken skin .  Patient discussed that it has been a while since she had a fall, April. She reports pain as being 7/10.  Patient reports that [name] will be her primary provider, discussed visit on last week to establish at practice. Patient discussed MD planned to order home health therapy , but she hasn't heard anything yet.   Nutrition  Patient discussed her  recent weight at visit was 74, patient reports that weight being 83 at prior medical visit in May. Patient states that she is eating more than she has in a while focusing in getting in enough protein. She states MD did labs work to follow up on weight loss concern. Patient reports that she has not started to drink ensure/ boost as recommended yet.   Patient will continue to be followed for complex care management, high fall risk , at risk for readmissions, care coordination needs.  Plan Placed call to [place] office as patient reports she has next visit in July, spoke with RN Lucy to inform of patient fall events, she reports that she will pass information to MD. Provided my contact number.  Reinforced fall precautions and notifying MD of worsening of discomfort related to falls and urgent follow up as needed.  Will plan RNCM follow up call in the next week regarding recent fall.    THN CM Care Plan Problem One     Most Recent Value  Care Plan Problem One  High risk for readmission related to malnutrition ,falls related to neuropathy   [care plan reinitiated. ]  Role Documenting the Problem One  Care Management [place] for Problem One  Active  THN Long Term Goal   Patient will not experience a fall over the next 31 days   THN Long Term Goal Start Date  02/17/19  [facility] Long Term Goal Met Date  02/17/19  Interventions for Problem One Long Term Goal  REview of current clinical state, report of fall, call to PCP office , advised importance of notiifying MD of fall  occurences with possible injury for MD follow up .   THN CM Short Term Goal #1   Patient will be able to report being active with home health over the next 14 days   THN CM Short Term Goal #1 Start Date  02/17/19  [facility] CM Short Term Goal #1 Met Date  01/22/19  Interventions for Short Term Goal #1  Follow up call to PCP office, regarding patient reported plan of home PT/OT   [facility] CM Short Term Goal #2   Patient will be able  to report including nutrition supplement drink at least twice daily over the next 30 days   THN CM Short Term Goal #2 Start Date  02/17/19  Interventions for Short Term Goal #2  Advised regarding benefits of protein in diet to help with strenght and balance, discussed importance of small meals through the day instead of 3 meal plan to get more nutrition in .   THN CM Short Term Goal #3  Over the next 30 days patient will be able to report continuing to drink protien drink twice daily between meals   THN CM Short Term Goal #3 Start Date  11/05/18  [facility] CM Short Term Goal #3 Met Date  11/25/18  THN CM Short Term Goal #4  Over the next 30 days patient will be able to report eating something  at least every 2 to 3 hours.   THN CM Short Term Goal #4 Start Date  11/25/18  [facility] CM Short Term Goal #4 Met Date  01/05/19  Interventions for Short Term Goal #4  Review of patient progress, on nutrition and intake, again reinforced protein and nutrition suggestions , praised patient progress   THN CM Short Term Goal #5   Patient will be able to report increase of distance of walking in apartment as evidenced by patient report over the next 30 days   [facility] CM Short Term Goal #5 Start Date  01/22/19 [name] restarted ]     Joylene Draft, RN, [place] Management Coordinator  [phone]- Mobile [phone]- [place]

## 2019-02-18 ENCOUNTER — Ambulatory Visit: Payer: Self-pay | Admitting: *Deleted

## 2019-02-23 ENCOUNTER — Other Ambulatory Visit: Payer: Self-pay | Admitting: *Deleted

## 2019-02-23 ENCOUNTER — Encounter: Payer: Self-pay | Admitting: *Deleted

## 2019-02-23 NOTE — Patient Outreach (Signed)
[place] [facility]) Care Management [place] Telephone Outreach Care Coordination  02/23/2019  [name] [facility] [birth year] [id number]  [place] Telephone Outreach Care Coordination  Telephone outreach for care coordination in coverage for primary [facility] RN CM Landis Martins, to Katherine Baird, 68 y/o female referred to [place] for multiple ED and hospital visits.  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  Attempted phone calls to both numbers listed in EMR for patient:  1)  Mobile number (preferred number): HIPAA compliant voice mail message left for patient, provided information that regular scheduled call from primary [place] CM will be re-scheduled for next week, upon primary RN CM return to work.  Left patient my direct contact information on message, should she wish to contact me this week while primary [facility] RN CM is out of office.  2) attempted outreach to second phone number listed in EMR for patient as home number:  Spoke with "Katherine Baird" who identifies herself as the patient's "caregiver of her mother;" Katherine Baird is able to verify patient's name and and I explained purpose of call to Katherine Baird, as above, who reports she communicates regularly with patient and will share the message with her.  I also provided Katherine Baird my direct contact information to share with patient should she wish to contact me in coverage while primary [facility] RN CM is out of office   Plan:  Will cancel this week's previously scheduled call to patient, as primary [facility] RN CM is currently out of office  Will update primary [place] RN CM that care coordination outreach has been placed to  patient, notifying that regularly scheduled call for this week will be re-scheduled upon primary [facility] RN CM return to work  Oneta Rack, Therapist, sports, BSN, [employer] [facility] Care Management  [phone]

## 2019-02-24 ENCOUNTER — Ambulatory Visit: Payer: Medicare Other | Admitting: *Deleted

## 2019-03-02 ENCOUNTER — Other Ambulatory Visit: Payer: Self-pay | Admitting: *Deleted

## 2019-03-02 ENCOUNTER — Encounter: Payer: Self-pay | Admitting: *Deleted

## 2019-03-02 NOTE — Patient Outreach (Signed)
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  ED visit 11/22 at [facility] ED Dx : Pyelonephritis , back pain  ED visit 11/24 at [facility] ED Dx: Abdominal pain, Constipation. Referral to [facility] spine center regarding T4 compression fracture, await phone call within a week regarding scheduling an appointment . Admission to [facility] 12/4- 12/10 Dx: Vomiting intractability, abdominal pain, dehydration, constipation, FTT,concern for depressive illnesschronic/remote Lacunar infarcts ED visit 12/06/18 Fall , skin tear right forearm .Marland Kitchen   Successful Outreach call to patient mobile number, patient discussed doing pretty good maybe getting a little stronger working on her legs exercises.  Patient discussed having continued problem with neuropathy concerns, difficulty walking, holding items in her hands states that it is not any worse but not any better.  She discussed MD plans for referral to [facility] patient states for specialist she believes for her neuropathy. She reports that she still does see Dr.Shah that has been managing neuropathy , and will call to arrange sooner visit with him as next appointment not until September.  Patient mentioned noticing that her toenail beds are purplish discolored in the morning but improves throughout the day and that this sometimes happens to her hands .  She denies any increase in pain in feet she shared having problems with anemia.   Patient further  discussed : High Fall risk  No further falls, she continues to  mostly get around home in her wheelchair. Patient voiced having continued 24 hours support in the home to helps with ADLs and other needed care.  Patient states that she has not heard from home health PT/OT that MD planned to order she asked for assistance with following up on this.   Nutrition  Patient discussed that she has been working hard on improving her nutrition trying to make sure that she is increasing protein in her diet, again stating that she is eating more than she has in a while. She discussed her breakfast on this morning being bacon,egg cheese biscuit in which she ate all of it , she reports eating about 2 good meals a day. .She discussed adding protein powder to some foods also. Discussed benefit of nutrition consult due to malnutrition she discussed attending in the past and she states that she does not see the benefit as she has the information and has been working on what was recommended. Patient discussed MD obtaining lab work that may provide additional information on nutrition/weight loss .  Reviewed benefit of liquid nutrition supplements to diet, patient states that she has not added boost back to her daily plan yet but she will start.  Discussed monitoring weight progress at home, if she can safely stand with help, reports that she plans to get her scales to day  Encounter Medications:  Outpatient Encounter Medications as of 03/02/2019  Medication Sig  . acetaminophen (TYLENOL) 500 MG tablet Take 500 mg by mouth every 8 (eight) hours as needed for moderate pain.  Marland Kitchen aspirin EC 81 MG tablet Take 81 mg by mouth daily.  . carvedilol (COREG) 6.25 MG tablet Take 6.25 mg by mouth 2 (two)  times daily with a meal.   . clobetasol ointment (TEMOVATE) 0.05 % Apply 1 application topically 2 (two) times daily as needed (for irritation).   Marland Kitchen. denosumab (PROLIA) 60 MG/ML SOSY injection Inject 60 mg into the skin every 6 (six) months.  . docusate sodium (COLACE) 100 MG capsule Take 100 mg by  mouth daily as needed for mild constipation. Taking one to two a day  . Dupilumab 300 MG/2ML SOSY INJECT THE CONTENTS OF 1 SYRINGE (300MG ) EVERY 2 WEEKS  . folic acid (FOLVITE) 1 MG tablet Take 1 mg by mouth daily.  Marland Kitchen. gabapentin (NEURONTIN) 100 MG capsule Take 100 mg by mouth 4 (four) times daily.   Marland Kitchen. levothyroxine (SYNTHROID, LEVOTHROID) 50 MCG tablet TAKE 1 TABLET BY MOUTH  DAILY  . mirtazapine (REMERON) 15 MG tablet Take 15 mg by mouth at bedtime.  . polyethylene glycol (MIRALAX / GLYCOLAX) packet Take 17 g by mouth daily as needed.   . SYRINGE/NEEDLE, DISP, 1 ML (BD INTEGRA SYRINGE) 25G X 5/8" 1 ML MISC 1 each by Does not apply route every 30 (thirty) days.  . traMADol (ULTRAM) 50 MG tablet Take 1 tablet (50 mg total) by mouth every 6 (six) hours as needed for moderate pain or severe pain. (Patient taking differently: Take 50 mg by mouth 3 (three) times daily. )   Facility-Administered Encounter Medications as of 03/02/2019  Medication  . cyanocobalamin ((VITAMIN B-12)) injection 1,000 mcg    Functional Status:  In your present state of health, do you have any difficulty performing the following activities: 01/05/2019 07/15/2018  Hearing? N N  Vision? N N  Difficulty concentrating or making decisions? Malvin JohnsY Y  Walking or climbing stairs? Y N  Comment uses walker, needs assistance with steps  chronic back pain   Dressing or bathing? Malvin JohnsY Y  Comment has caregiver that assist  -  Doing errands, shopping? Malvin JohnsY Y  Comment family assist  -  Preparing Food and eating ? Malvin JohnsY Y  Comment family assist  family assist   Using the Toilet? N N  In the past six months, have you accidently leaked urine? N N  Do you have problems with loss of bowel control? N N  Managing your Medications? Malvin JohnsY Y  Comment sister assist  -  Managing your Finances? Y N  Comment family helps  -  Housekeeping or managing your Housekeeping? Malvin JohnsY Y  Comment family assist  family assisting  Some recent data might be hidden     Fall/Depression Screening: Fall Risk  03/02/2019 01/06/2019 11/25/2018  Falls in the past year? 1 1 1   Comment - - late entry for 4/1  Number falls in past yr: 1 1 1   Injury with Fall? 1 0 1  Risk for fall due to : Impaired mobility;Impaired balance/gait;History of fall(s) History of fall(s);Impaired balance/gait;Impaired mobility Impaired mobility;Impaired balance/gait;History of fall(s)  Follow up Falls prevention discussed - Falls prevention discussed;Education provided   [facility] Scores 03/02/2019 01/06/2019 11/03/2018 09/22/2018 09/03/2018 07/15/2018 04/06/2018  PHQ - 2 Score 1 0 0 0 0 2 0  PHQ- 9 Score - - - - - 9 -    Assessment:  Will benefit from continued complex care management for chronic medical conditions.   Care Coordination call  Placed call to [name] office, spoke with Luster [place]bergenee, CMA to discuss patient concern regarding during this call. 1.Patient concern regarding not hearing from Home health OT/PT service to be ordered by PCP, Renee states [facility].  2. Discussed with Renee patient shared  MD plan to be referred to Dr.Hopkins regarding her neuropathy, renee states patient should follow up with Dr. Manuella Ghazi regarding neuropathy, she does not see referral in place for Dr.Hopkins at this time.  3. Patient shared noticing her toe nail bed being purplish/blue discolored in the morning but color improves throughout the day. Patient denies increased in pain, she does sleep in socks and reports that this sometimes happens to hands also. Renee suggested that patient call office and speak with triage nurse, asked if I speak with nurse, she states it would be best for patient to call as nurse will ask more questions.      Plan:  Placed return call patient for follow up, encouraged scheduling sooner office visit with Dr.Shah regarding neuropathy.  Encouraged patient  to call [name]  office regarding symptoms of discoloration on toe nail beds to speak  with triage nurse.  Will send PCP office visit note.  Will plan follow up call to patient within week.  Placed call to [facility] regarding home health referral orders , representative states that she does not have orders for home health.   THN CM Care Plan Problem One     Most Recent Value  Care Plan Problem One  High risk for readmission related to malnutrition ,falls related to neuropathy   [care plan reinitiated. ]  Role Documenting the Problem One  Care Management [place] for Problem One  Active  THN Long Term Goal   Patient will not experience a fall over the next 31 days   THN Long Term Goal Start Date  02/17/19  Interventions for Problem One Long Term Goal  Discussed current clinical state, call to PCP office regarding patient concern regarding discoloration of toe nail beds  Falll precautions reviewed verifed 24 hour care remains in place.   THN CM Short Term Goal #1   Patient will be able to report being active with home health over the next 14 days   THN CM Short Term Goal #1 Start Date  03/02/19 [call restarted ]  Interventions for Short Term Goal #1  Call to PCP office to follow up on Home health services previously ordered   [facility] CM Short Term Goal #2   Patient will be able to report including nutrition supplement drink at least twice daily over the next 30 days   THN CM Short Term Goal #2 Start Date  02/17/19  Interventions for Short Term Goal #2  Reviewed benefits of nutrition supplement to provide additonal nutrients including protein.       Joylene Draft, RN, [place] Management Coordinator  [phone]- Mobile [phone]- Toll Free Main Office

## 2019-03-04 ENCOUNTER — Emergency Department
Admission: EM | Admit: 2019-03-04 | Discharge: 2019-03-04 | Disposition: A | Discharge status: Home / Self Care | Payer: Medicare Other | Attending: Emergency Medicine | Admitting: Emergency Medicine

## 2019-03-04 ENCOUNTER — Other Ambulatory Visit: Payer: Self-pay

## 2019-03-04 ENCOUNTER — Other Ambulatory Visit: Payer: Self-pay | Admitting: *Deleted

## 2019-03-04 DIAGNOSIS — Z5321 Procedure and treatment not carried out due to patient leaving prior to being seen by health care provider: Secondary | ICD-10-CM | POA: Diagnosis not present

## 2019-03-04 DIAGNOSIS — L89602 Pressure ulcer of unspecified heel, stage 2: Secondary | ICD-10-CM | POA: Diagnosis not present

## 2019-03-04 DIAGNOSIS — L89302 Pressure ulcer of unspecified buttock, stage 2: Secondary | ICD-10-CM | POA: Diagnosis not present

## 2019-03-04 DIAGNOSIS — R230 Cyanosis: Secondary | ICD-10-CM | POA: Diagnosis not present

## 2019-03-04 LAB — CBC WITH DIFFERENTIAL/PLATELET
Abs Immature Granulocytes: 0.01 10*3/uL (ref 0.00–0.07)
Basophils Absolute: 0 10*3/uL (ref 0.0–0.1)
Basophils Relative: 0 %
Eosinophils Absolute: 0.4 10*3/uL (ref 0.0–0.5)
Eosinophils Relative: 8 %
HCT: 32.6 % — ABNORMAL LOW (ref 36.0–46.0)
Hemoglobin: 10.7 g/dL — ABNORMAL LOW (ref 12.0–15.0)
Immature Granulocytes: 0 %
Lymphocytes Relative: 33 %
Lymphs Abs: 1.6 10*3/uL (ref 0.7–4.0)
MCH: 30.4 pg (ref 26.0–34.0)
MCHC: 32.8 g/dL (ref 30.0–36.0)
MCV: 92.6 fL (ref 80.0–100.0)
Monocytes Absolute: 0.4 10*3/uL (ref 0.1–1.0)
Monocytes Relative: 8 %
Neutro Abs: 2.5 10*3/uL (ref 1.7–7.7)
Neutrophils Relative %: 51 %
Platelets: 258 10*3/uL (ref 150–400)
RBC: 3.52 MIL/uL — ABNORMAL LOW (ref 3.87–5.11)
RDW: 13.5 % (ref 11.5–15.5)
WBC: 4.9 10*3/uL (ref 4.0–10.5)
nRBC: 0 % (ref 0.0–0.2)

## 2019-03-04 LAB — COMPREHENSIVE METABOLIC PANEL
ALT: 14 U/L (ref 0–44)
AST: 23 U/L (ref 15–41)
Albumin: 4.1 g/dL (ref 3.5–5.0)
Alkaline Phosphatase: 63 U/L (ref 38–126)
Anion gap: 8 (ref 5–15)
BUN: 29 mg/dL — ABNORMAL HIGH (ref 8–23)
CO2: 29 mmol/L (ref 22–32)
Calcium: 9.5 mg/dL (ref 8.9–10.3)
Chloride: 103 mmol/L (ref 98–111)
Creatinine, Ser: 0.66 mg/dL (ref 0.44–1.00)
GFR calc Af Amer: >60 mL/min (ref >60)
GFR calc non Af Amer: >60 mL/min (ref >60)
Glucose, Bld: 90 mg/dL (ref 70–99)
Potassium: 4.9 mmol/L (ref 3.5–5.1)
Sodium: 140 mmol/L (ref 135–145)
Total Bilirubin: 0.4 mg/dL (ref 0.3–1.2)
Total Protein: 7.1 g/dL (ref 6.5–8.1)

## 2019-03-04 NOTE — Care Management (Signed)
RNCM notified by Patient Katherine Baird that patient is open to [facility] home health. [place] with [facility] states her PCP sent orders today for RN/PT and they have not started care yet. Patient is also followed by community CM [facility].

## 2019-03-04 NOTE — ED Triage Notes (Signed)
Pt reports that PCP advised her to come to ED - pt left foot/toes are blue in color and cool to touch - her PCP is concerned about a blood clot or no circulation in left foot - pt has been unable to stand or walk since May 2019

## 2019-03-04 NOTE — Patient Outreach (Signed)
[facility]) Care Management  03/04/2019  Hydie Langan Dec 14, 1950 [id number]   Care Coordination   Incoming call from patient sister, Horatio Pel, [facility], she reports being at patient home at this time and has been informed by patient caregivers having noticed what they describe as a "bedsore to left  heel and backside. They currently have patient heeled elevated on pressure on area and patient is sitting on pressure cushion.  [name] is asking if home health will be able to come out and check on patient .  Discussed with [name] call to PCP office on 7/7 regarding patient concern regarding no follow up from home health yet.  Discussed patient also reported noting her feet/toe nail bluish discolored in the morning and improves during the day, shared being advised by the office to have patient call to office and speak with triage nurse, patient agreed to do so.  While on the phone sister asked patient questions regarding being contacted by home health and patient states no that she has not been contacted, also patient admits to not calling PCP office .  Placed return call to [place] office, spoke with triage nurse Katharine Look, I shared concerns regarding patient report of noting discoloration of feet and nail beds, caregiver noting skin care issue at heel and buttock area as well as issue with no home health follow up, also patient has requested orders for home health be sent to [facility] .  RN requested best option is for someone in the home with patient  to call office speak with triage nurse to better assess medical concerns  and determine next step.  Placed return call to patient sister [name] that is currently in the home and agrees to place call to [place] office.  Patient verifies [name] as now being her PCP.   Plan Will plan return call in the next 2 business days   Joylene Draft, RN, [place] Management Coordinator  [phone]-  Mobile [phone]- [place]

## 2019-03-05 ENCOUNTER — Encounter: Admit: 2019-03-05 | Discharge: 2019-03-05 | Disposition: A | Discharge status: Home / Self Care | Payer: MEDICARE

## 2019-03-05 DIAGNOSIS — L89629 Pressure ulcer of left heel, unspecified stage: Principal | ICD-10-CM

## 2019-03-05 DIAGNOSIS — L89309 Pressure ulcer of unspecified buttock, unspecified stage: Secondary | ICD-10-CM

## 2019-03-05 DIAGNOSIS — L89621 Pressure ulcer of left heel, stage 1: Secondary | ICD-10-CM | POA: Diagnosis not present

## 2019-03-05 DIAGNOSIS — I5032 Chronic diastolic (congestive) heart failure: Secondary | ICD-10-CM | POA: Diagnosis not present

## 2019-03-05 DIAGNOSIS — Z7982 Long term (current) use of aspirin: Secondary | ICD-10-CM | POA: Diagnosis not present

## 2019-03-05 DIAGNOSIS — Z882 Allergy status to sulfonamides status: Secondary | ICD-10-CM | POA: Diagnosis not present

## 2019-03-05 DIAGNOSIS — Z888 Allergy status to other drugs, medicaments and biological substances status: Secondary | ICD-10-CM | POA: Diagnosis not present

## 2019-03-05 DIAGNOSIS — E785 Hyperlipidemia, unspecified: Secondary | ICD-10-CM | POA: Diagnosis not present

## 2019-03-05 DIAGNOSIS — L89151 Pressure ulcer of sacral region, stage 1: Secondary | ICD-10-CM | POA: Diagnosis not present

## 2019-03-05 DIAGNOSIS — M869 Osteomyelitis, unspecified: Secondary | ICD-10-CM | POA: Diagnosis not present

## 2019-03-05 DIAGNOSIS — I480 Paroxysmal atrial fibrillation: Secondary | ICD-10-CM | POA: Diagnosis not present

## 2019-03-05 DIAGNOSIS — R200 Anesthesia of skin: Secondary | ICD-10-CM | POA: Diagnosis not present

## 2019-03-06 ENCOUNTER — Telehealth: Payer: Self-pay | Admitting: Family Medicine

## 2019-03-06 DIAGNOSIS — Z681 Body mass index (BMI) 19 or less, adult: Secondary | ICD-10-CM

## 2019-03-06 DIAGNOSIS — B0223 Postherpetic polyneuropathy: Secondary | ICD-10-CM

## 2019-03-06 DIAGNOSIS — I7300 Raynaud's syndrome without gangrene: Secondary | ICD-10-CM

## 2019-03-06 DIAGNOSIS — D509 Iron deficiency anemia, unspecified: Secondary | ICD-10-CM

## 2019-03-06 DIAGNOSIS — M5124 Other intervertebral disc displacement, thoracic region: Secondary | ICD-10-CM

## 2019-03-06 DIAGNOSIS — I480 Paroxysmal atrial fibrillation: Secondary | ICD-10-CM

## 2019-03-06 DIAGNOSIS — E538 Deficiency of other specified B group vitamins: Secondary | ICD-10-CM

## 2019-03-06 DIAGNOSIS — M359 Systemic involvement of connective tissue, unspecified: Secondary | ICD-10-CM

## 2019-03-06 DIAGNOSIS — M069 Rheumatoid arthritis, unspecified: Secondary | ICD-10-CM

## 2019-03-06 DIAGNOSIS — E43 Unspecified severe protein-calorie malnutrition: Secondary | ICD-10-CM

## 2019-03-06 DIAGNOSIS — I110 Hypertensive heart disease with heart failure: Secondary | ICD-10-CM

## 2019-03-06 DIAGNOSIS — L89151 Pressure ulcer of sacral region, stage 1: Secondary | ICD-10-CM

## 2019-03-06 DIAGNOSIS — L89622 Pressure ulcer of left heel, stage 2: Principal | ICD-10-CM

## 2019-03-06 DIAGNOSIS — I5042 Chronic combined systolic (congestive) and diastolic (congestive) heart failure: Secondary | ICD-10-CM

## 2019-03-06 DIAGNOSIS — Z9181 History of falling: Secondary | ICD-10-CM

## 2019-03-06 DIAGNOSIS — M810 Age-related osteoporosis without current pathological fracture: Secondary | ICD-10-CM

## 2019-03-06 DIAGNOSIS — E039 Hypothyroidism, unspecified: Secondary | ICD-10-CM

## 2019-03-06 NOTE — Telephone Encounter (Signed)
Received after hours call regarding patient from [place], an Therapist, sports at [facility] regarding patient's home health orders placed in the ER last night. Was discharged home yesterday after being diagnosed with a stage 1 pressure ulcer of left foot. Left VM on the number provided [phone]). Will await call back

## 2019-03-07 NOTE — Telephone Encounter (Signed)
Katherine Baird returned my call yesterday afternoon, discussed that she would send orders for home health for patient to Dr. Gaspar Cola office for review next week related to her pressure ulcer.

## 2019-03-08 ENCOUNTER — Other Ambulatory Visit: Payer: Self-pay | Admitting: *Deleted

## 2019-03-08 ENCOUNTER — Encounter: Admit: 2019-03-08 | Discharge: 2019-03-10 | Payer: MEDICARE

## 2019-03-08 DIAGNOSIS — E43 Unspecified severe protein-calorie malnutrition: Secondary | ICD-10-CM

## 2019-03-08 DIAGNOSIS — L89151 Pressure ulcer of sacral region, stage 1: Secondary | ICD-10-CM

## 2019-03-08 DIAGNOSIS — I5042 Chronic combined systolic (congestive) and diastolic (congestive) heart failure: Secondary | ICD-10-CM

## 2019-03-08 DIAGNOSIS — E538 Deficiency of other specified B group vitamins: Secondary | ICD-10-CM

## 2019-03-08 DIAGNOSIS — M069 Rheumatoid arthritis, unspecified: Secondary | ICD-10-CM

## 2019-03-08 DIAGNOSIS — I480 Paroxysmal atrial fibrillation: Secondary | ICD-10-CM

## 2019-03-08 DIAGNOSIS — D509 Iron deficiency anemia, unspecified: Secondary | ICD-10-CM

## 2019-03-08 DIAGNOSIS — M359 Systemic involvement of connective tissue, unspecified: Secondary | ICD-10-CM

## 2019-03-08 DIAGNOSIS — B0223 Postherpetic polyneuropathy: Secondary | ICD-10-CM

## 2019-03-08 DIAGNOSIS — I7300 Raynaud's syndrome without gangrene: Secondary | ICD-10-CM

## 2019-03-08 DIAGNOSIS — Z681 Body mass index (BMI) 19 or less, adult: Secondary | ICD-10-CM

## 2019-03-08 DIAGNOSIS — M810 Age-related osteoporosis without current pathological fracture: Secondary | ICD-10-CM

## 2019-03-08 DIAGNOSIS — M5124 Other intervertebral disc displacement, thoracic region: Secondary | ICD-10-CM

## 2019-03-08 DIAGNOSIS — Z9181 History of falling: Secondary | ICD-10-CM

## 2019-03-08 DIAGNOSIS — I110 Hypertensive heart disease with heart failure: Secondary | ICD-10-CM

## 2019-03-08 DIAGNOSIS — L89622 Pressure ulcer of left heel, stage 2: Principal | ICD-10-CM

## 2019-03-08 DIAGNOSIS — E039 Hypothyroidism, unspecified: Secondary | ICD-10-CM

## 2019-03-08 NOTE — Patient Outreach (Signed)
[facility]) Care Management  03/08/2019  Katherine Baird [facility] February 05, 1951 [id number]   Telephone assessment   Patient with recent ED visit to [facility] as recommended by patient PCP office at [facility] primary care due to concern with bluish discoloration to toes, pain and  pressure wounds to buttock and left heel and concern for blockage .   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  Successful outreach call to patient discussed doing better than she was, strong in voice. Patient discussed her recent visit to [facility] ED on 7/9 and then on to [facility] ED on 7/10.  Patient states being relieved that she does not have a blood clot.  Patient discussed anticipated home health RN to visit on today, she believes that [facility] is with [facility], but she has visit for 2pm today. Care everywhere reviewed and noted referral to home health per [facility] home health .   Patient further discussed : Pressure wounds Patient discussed that her personal care provider has applied a tegaderm like dressing to left heel , she discussed site at sacral area had been oozing last week but not in last few days and has a dressing in place. Patient denies pain in foot at this time .  Reviewed measures related to managing and preventing worsening of wound, pressure relief, nutrition, mobility.  Patient discussed working on keeping pressure off heels, working on increasing her nutrition and starting back to drinking boost. Discussed measures to prevent constipation, including use of miralax.   High Fall risk  Patient has 24 hours support in home , using wheelchair for mobility and personal care assistance with  changing positions. Denies recent falls.   Post ED follow up. Discussed with patient recommendations for post ED visit follow up with PCP, patient again identifies Dr. Richardine Service being her PCP and has scheduled visit on 7/28, advised need for sooner visit after ED visit, she voiced understanding and will call office for visit. Patient is unsure if she has requested records from prior PCP to Dr. Richardine Service as her PCP of record.  Discussed with patient recommended referral to vascular MD for follow up after ED visit she was not aware.   Follow up call to patient sister Katherine Baird, Katherine Baird, she is currently returning home out of state after being in town and visiting with sister over the last week.  She discussed patient recent ED visit and being aware of patient need for follow up with PCP and awaiting referral for vascular MD . She reports Katherine Baird has some cognitive delays and sometimes not able to keep up with all the appointments, Katherine Baird states that he sister in town [place] usually helps with all appointments and she speaks with her daily and will remind her as she is Katherine Baird is usually at work most of the day. Katherine Baird will discuss with her sister at patient next visit with [name] to complete necessary paper work for new PCP if that is patient desire, she understands importance of having one PCP of record.  [name] discussed that patient is wearing a pressure boot ( prevalon boot)  from  family had to left foot to alleviate  pressure to left heel, and patient has a good mattress at home and Purple mattress with gel  foam.   Assessment  Patient will continue to benefit from ongoing care management of chronic conditions, High fall risk, Nutrition concerns , pressure wounds.   Plan  Will plan follow up call in the next 4 business days, regarding home health visit, PCP follow up.  Fall prevention measures reviewed. Will send EMMI on pressure sores.     THN CM Care Plan Problem One     Most Recent Value  Care  Plan Problem One  High risk for readmission related to malnutrition ,falls related to neuropathy   [care plan reinitiated. ]  Role Documenting the Problem One  Care Management [place] for Problem One  Active  THN Long Term Goal   Patient will not experience a fall over the next 60  days   THN Long Term Goal Start Date  02/17/19 [name] extended, recent ED visit concerns ]  Interventions for Problem One Long Term Goal  Reviewed recent ED visit, discussed discharge follow up recommendations, outreach call to patient sister for reivew of follow up needed.   THN CM Short Term Goal #1   Patient will be able to report being active with home health over the next 14 days   THN CM Short Term Goal #1 Start Date  03/02/19 [call restarted ]  Interventions for Short Term Goal #1  Discussed current status on home health , patinet reported visiit on today, will follow in next 4 days regarding visit .   THN CM Short Term Goal #2   Patient will be able to report including nutrition supplement drink at least twice daily over the next 30 days   THN CM Short Term Goal #2 Start Date  03/08/19  Interventions for Short Term Goal #2  advised regarding importance of proper nutrition for strenght , wound healing      THN CM Care Plan Problem Two     Most Recent Value  Care Plan Problem Two  Knowledge defict related to self care managment of pressure wound and preventions  Role Documenting the Problem Two  Care Management [place] for Problem Two  Active  Interventions for Problem Two Long Term Goal   Discussed with patient measures to improve healing process , nutrition , mobility , reducing pressure. Will send EMMI education handout.    THN Long Term Goal  Over the next 45 days patient will be able to report wound healing   THN Long Term Goal Start Date  03/08/19  [facility] CM Short Term Goal #1   Patient will be able to reports attending all medical appointments over the next 14 days.   THN CM  Short Term Goal #1 Start Date  03/08/19  Interventions for Short Term Goal #2   Advised regarding importance of recommended follow up , with specialist, and post ED visit with PCP,contacted family that assist with scheduling appointment and providing transportation.  THN CM Short Term Goal #2   Over the next 30 days patient will be able to demonstrate 2 measures toward  pressure wound prevention and healing .   THN CM Short Term Goal #2 Start Date  03/08/19  Interventions for Short Term Goal #2  Advised regarding importance of proper nutrition especially protien for wound healing, advised importance of pressure relief, changing positon as least every 2 hours during the day, explained benefits of this for prevention .       Joylene Draft, RN, [place] Management Coordinator  [phone]- Mobile [phone]-  Toll Free Main Office

## 2019-03-09 DIAGNOSIS — I5181 Takotsubo syndrome: Secondary | ICD-10-CM | POA: Diagnosis not present

## 2019-03-09 DIAGNOSIS — Z9889 Other specified postprocedural states: Secondary | ICD-10-CM | POA: Diagnosis not present

## 2019-03-09 DIAGNOSIS — L819 Disorder of pigmentation, unspecified: Secondary | ICD-10-CM | POA: Diagnosis not present

## 2019-03-09 DIAGNOSIS — E7849 Other hyperlipidemia: Secondary | ICD-10-CM | POA: Diagnosis not present

## 2019-03-10 DIAGNOSIS — Z9181 History of falling: Secondary | ICD-10-CM

## 2019-03-10 DIAGNOSIS — L89151 Pressure ulcer of sacral region, stage 1: Secondary | ICD-10-CM

## 2019-03-10 DIAGNOSIS — M069 Rheumatoid arthritis, unspecified: Secondary | ICD-10-CM

## 2019-03-10 DIAGNOSIS — M359 Systemic involvement of connective tissue, unspecified: Secondary | ICD-10-CM

## 2019-03-10 DIAGNOSIS — I110 Hypertensive heart disease with heart failure: Secondary | ICD-10-CM

## 2019-03-10 DIAGNOSIS — I480 Paroxysmal atrial fibrillation: Secondary | ICD-10-CM

## 2019-03-10 DIAGNOSIS — D509 Iron deficiency anemia, unspecified: Secondary | ICD-10-CM

## 2019-03-10 DIAGNOSIS — I7300 Raynaud's syndrome without gangrene: Secondary | ICD-10-CM

## 2019-03-10 DIAGNOSIS — E039 Hypothyroidism, unspecified: Secondary | ICD-10-CM

## 2019-03-10 DIAGNOSIS — I5042 Chronic combined systolic (congestive) and diastolic (congestive) heart failure: Secondary | ICD-10-CM

## 2019-03-10 DIAGNOSIS — E538 Deficiency of other specified B group vitamins: Secondary | ICD-10-CM

## 2019-03-10 DIAGNOSIS — Z681 Body mass index (BMI) 19 or less, adult: Secondary | ICD-10-CM

## 2019-03-10 DIAGNOSIS — L89622 Pressure ulcer of left heel, stage 2: Principal | ICD-10-CM

## 2019-03-10 DIAGNOSIS — E43 Unspecified severe protein-calorie malnutrition: Secondary | ICD-10-CM

## 2019-03-10 DIAGNOSIS — M5124 Other intervertebral disc displacement, thoracic region: Secondary | ICD-10-CM

## 2019-03-10 DIAGNOSIS — M810 Age-related osteoporosis without current pathological fracture: Secondary | ICD-10-CM

## 2019-03-10 DIAGNOSIS — B0223 Postherpetic polyneuropathy: Secondary | ICD-10-CM

## 2019-03-10 DIAGNOSIS — G629 Polyneuropathy, unspecified: Secondary | ICD-10-CM | POA: Diagnosis not present

## 2019-03-10 DIAGNOSIS — M3500 Sicca syndrome, unspecified: Secondary | ICD-10-CM | POA: Diagnosis not present

## 2019-03-10 DIAGNOSIS — D508 Other iron deficiency anemias: Secondary | ICD-10-CM | POA: Diagnosis not present

## 2019-03-10 DIAGNOSIS — I10 Essential (primary) hypertension: Secondary | ICD-10-CM | POA: Diagnosis not present

## 2019-03-10 DIAGNOSIS — K219 Gastro-esophageal reflux disease without esophagitis: Secondary | ICD-10-CM | POA: Diagnosis not present

## 2019-03-10 DIAGNOSIS — M1990 Unspecified osteoarthritis, unspecified site: Secondary | ICD-10-CM | POA: Diagnosis not present

## 2019-03-10 DIAGNOSIS — L309 Dermatitis, unspecified: Secondary | ICD-10-CM | POA: Diagnosis not present

## 2019-03-10 DIAGNOSIS — E063 Autoimmune thyroiditis: Secondary | ICD-10-CM | POA: Diagnosis not present

## 2019-03-10 DIAGNOSIS — M0609 Rheumatoid arthritis without rheumatoid factor, multiple sites: Secondary | ICD-10-CM | POA: Diagnosis not present

## 2019-03-10 DIAGNOSIS — M5115 Intervertebral disc disorders with radiculopathy, thoracolumbar region: Secondary | ICD-10-CM | POA: Diagnosis not present

## 2019-03-10 DIAGNOSIS — Z79891 Long term (current) use of opiate analgesic: Secondary | ICD-10-CM | POA: Diagnosis not present

## 2019-03-10 DIAGNOSIS — M4185 Other forms of scoliosis, thoracolumbar region: Secondary | ICD-10-CM | POA: Diagnosis not present

## 2019-03-10 DIAGNOSIS — M8008XD Age-related osteoporosis with current pathological fracture, vertebra(e), subsequent encounter for fracture with routine healing: Secondary | ICD-10-CM | POA: Diagnosis not present

## 2019-03-10 DIAGNOSIS — E7849 Other hyperlipidemia: Secondary | ICD-10-CM | POA: Diagnosis not present

## 2019-03-10 DIAGNOSIS — I5181 Takotsubo syndrome: Secondary | ICD-10-CM | POA: Diagnosis not present

## 2019-03-11 ENCOUNTER — Other Ambulatory Visit: Payer: Self-pay | Admitting: *Deleted

## 2019-03-11 DIAGNOSIS — E538 Deficiency of other specified B group vitamins: Secondary | ICD-10-CM

## 2019-03-11 DIAGNOSIS — E43 Unspecified severe protein-calorie malnutrition: Secondary | ICD-10-CM

## 2019-03-11 DIAGNOSIS — M5124 Other intervertebral disc displacement, thoracic region: Secondary | ICD-10-CM

## 2019-03-11 DIAGNOSIS — L89151 Pressure ulcer of sacral region, stage 1: Secondary | ICD-10-CM

## 2019-03-11 DIAGNOSIS — I5042 Chronic combined systolic (congestive) and diastolic (congestive) heart failure: Secondary | ICD-10-CM

## 2019-03-11 DIAGNOSIS — D509 Iron deficiency anemia, unspecified: Secondary | ICD-10-CM

## 2019-03-11 DIAGNOSIS — B0223 Postherpetic polyneuropathy: Secondary | ICD-10-CM

## 2019-03-11 DIAGNOSIS — I110 Hypertensive heart disease with heart failure: Secondary | ICD-10-CM

## 2019-03-11 DIAGNOSIS — E039 Hypothyroidism, unspecified: Secondary | ICD-10-CM

## 2019-03-11 DIAGNOSIS — I7300 Raynaud's syndrome without gangrene: Secondary | ICD-10-CM

## 2019-03-11 DIAGNOSIS — L89622 Pressure ulcer of left heel, stage 2: Principal | ICD-10-CM

## 2019-03-11 DIAGNOSIS — Z9181 History of falling: Secondary | ICD-10-CM

## 2019-03-11 DIAGNOSIS — M810 Age-related osteoporosis without current pathological fracture: Secondary | ICD-10-CM

## 2019-03-11 DIAGNOSIS — Z681 Body mass index (BMI) 19 or less, adult: Secondary | ICD-10-CM

## 2019-03-11 DIAGNOSIS — M069 Rheumatoid arthritis, unspecified: Secondary | ICD-10-CM

## 2019-03-11 DIAGNOSIS — I480 Paroxysmal atrial fibrillation: Secondary | ICD-10-CM

## 2019-03-11 DIAGNOSIS — M359 Systemic involvement of connective tissue, unspecified: Secondary | ICD-10-CM

## 2019-03-11 NOTE — Patient Outreach (Signed)
[facility]) Care Management  03/11/2019  Katherine Baird 1950/11/20 [id number]   Telephone follow up call  [facility] complex care management active   Patient with recent ED visit to [facility] as recommended by patient PCP office at [facility] primary care due to concern with bluish discoloration to toes, pain and  pressure wounds to buttock and left heel and concern for blockage .   PMHx noted : 68 year old female  significant for Hypertension , Diastolic Heart failure, Takotsubo cardiomyopathy. 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   Successful telephone follow up call to patient, to confirm home health services in place . Patient discussed having home health RN visit on plans for home PT visit on today.  She discussed feeling pretty good on today and being thankful for home heath services to help her with working toward getting stronger.  Patient further discussed :  Wounds, left heel and sacral area She reports improvement in areas, using barrier cream to sacral area, no drainage at site. She discussed still having some purplish discoloration to toes,no increase in pain, states heart doctor reports no blood clot.   Discussed importance of pressure relieving measures as well as importance of nutrition for healing.  Patient states focusing on keeping heels elevated, wearing pressure boot at night and changing position during the day frequently with assistance. Patient discussed sitting on sofa most of the day .  Patient discussed continuing to improve with nutrition, trying to drink Baird boost a day, and including protein at meals.  Patient reports weight gain of 4 pounds since last MD visit current weight 78. Patient awaiting call regarding referral to vascular MD at [facility]   Patient has 24 support in home that assist as needed.  History of Takotsubo cardiomyopathy, diastolic heart failure, hypertension. Patient discussed recent visit with cardiology and reports everything was looking good blood pressure reading was 130/80. Patient does not monitor at home but discussed caregivers being able to assist her with this.  Patient denies shortness of breath, chest pain or swelling as review of symptoms of heart failure worsening.  Patient reports being unable to stand well enough to balance to weigh at this time, her Baird with to get strong enough to do this .   Will provide additional support and education on chronic conditions of diastolic heart failure.   Plan  Will send EMMI on Stress cardiomyopathy, Diastolic hypertension and diastolic heart failure for review at next telephone visit .  Will plan return call in the next 2 weeks.  Fall preventions measures reviewed.     Katherine Baird     Most Recent Value  Care Plan Problem Baird  High risk for readmission related to malnutrition ,falls related to neuropathy    (Pended)  Katherine Baird plan reinitiated. ]  Role Documenting the Problem Baird  Care Management Coordinator  (Pended)   Katherine Baird for Problem Baird  Active  (Pended)   Katherine Long Term Baird   Patient will not experience a fall over the next 60  days   (Pended)   Katherine Baird Start Date  02/17/19  (Pended)  Katherine Baird extended, recent ED visit concerns ]  Interventions for Problem Baird Long Term Baird  Discussed current clinical state , reinforced continued follow up on attending scheduling and referred appointments.   (  Pended)   Katherine CM Short Term Baird #1   Patient will be able to report being active with home health over the next 14 days   (Pended)   Katherine CM Short Term Baird #1 Start Date  03/02/19  (Pended)  Katherine Baird restarted ]  Katherine CM Short Term Baird #1 Met Date  03/11/19  (Pended)   Katherine CM Short Term Baird #2   Patient will be able to report including  nutrition supplement drink at least twice daily over the next 30 days   (Pended)   Katherine CM Short Term Baird #2 Start Date  03/08/19  (Pended)   Interventions for Short Term Baird #2  Encouraged to continue use of supplement for nutrition support.   (Pended)     Katherine CM Care Plan Problem Two     Most Recent Value  Care Plan Problem Two  Knowledge defict related to self care managment of pressure wound and preventions  (Pended)   Role Documenting the Problem Two  Care Management Coordinator  (Pended)   Care Plan for Problem Two  Active  (Pended)   Interventions for Problem Two Long Term Baird   review current wound healing , confirmed improvement per patient and home health involvment .   (Pended)   Katherine Long Term Baird  Over the next 45 days patient will be able to report wound healing   (Pended)   Katherine Long Term Baird Start Date  03/08/19  (Pended)   Katherine CM Short Term Baird #1   Patient will be able to reports attending all medical appointments over the next 14 days.   (Pended)   Katherine CM Short Term Baird #1 Start Date  03/08/19  (Pended)   Interventions for Short Term Baird #2   Discussed follow up with cardiology visit and pending vascular MD, reviewed importance of attending for care and diagnosis of problem with help with healling.   (Pended)   Katherine CM Short Term Baird #2   Over the next 30 days patient will be able to demonstrate 2 measures toward  pressure wound prevention and healing .   (Pended)   Katherine CM Short Term Baird #2 Start Date  03/08/19  (Pended)   Interventions for Short Term Baird #2  Reinforced, pressure relieving measures changes positons each hour during the day, and using prevalon boot at night.   (Pended)     Katherine CM Care Plan Problem Three     Most Recent Value  Care Plan Problem Three  Self care knowledge related to chronic conditions of Takotsubo cardiomyopathy, diastolic heart failure managment   (Pended)   Role Documenting the Problem Three  Care Management Coordinator  (Pended)    Care Plan for Problem Three  Active  (Pended)   Katherine Long Term Baird   Over the next 31 days patient will be able to demonstate 2 measures of self care management of chronic conditions  (Pended)   Katherine Long Term Baird Start Date  03/11/19  (Pended)   Interventions for Problem Three Long Term Baird  Advised regarding importance of controlling chronic conditions for overall health of body. taking medications as prescribed, being active as tolerated and keeping all medical appointments , Will send EMMI on Stress cardiomyopathy for review of Takotsubo conditons. and diastlic heart failure for reinforcment of when to call MD .   (Pended)   Katherine CM Short Term Baird #1   Over the next 30 days patient will be able to identify worsening  symptoms of heart failure and action plan    (Pended)   Katherine CM Short Term Baird #1 Start Date  03/11/19  (Pended)   Interventions for Short Term Baird #1  Reviewed symptoms of heart failure worsening symptoms and focused on yellow zone and action plan,  patient identiified as not being able to balance to stand at home at this time.   (Pended)   Katherine CM Short Term Baird #2   Patient will be able to monitor blood pressure at least once weekly over the next 30 days   (Pended)   Katherine CM Short Term Baird #2 Start Date  03/11/19  (Pended)   Interventions for Short Term Baird #2  Discussed importance of blood pressure managment , taking medications limiting salt in diet . Will send EMMI on hypertension and review with teach back at next visit   (Pended)       Joylene Draft, RN, [place] Management Coordinator  [phone]- Mobile [phone]- [place]

## 2019-03-12 DIAGNOSIS — Z9181 History of falling: Secondary | ICD-10-CM | POA: Diagnosis not present

## 2019-03-12 DIAGNOSIS — M1990 Unspecified osteoarthritis, unspecified site: Secondary | ICD-10-CM | POA: Diagnosis not present

## 2019-03-12 DIAGNOSIS — Z79891 Long term (current) use of opiate analgesic: Secondary | ICD-10-CM | POA: Diagnosis not present

## 2019-03-12 DIAGNOSIS — E43 Unspecified severe protein-calorie malnutrition: Secondary | ICD-10-CM | POA: Diagnosis not present

## 2019-03-12 DIAGNOSIS — I7300 Raynaud's syndrome without gangrene: Secondary | ICD-10-CM | POA: Diagnosis not present

## 2019-03-12 DIAGNOSIS — M5115 Intervertebral disc disorders with radiculopathy, thoracolumbar region: Secondary | ICD-10-CM | POA: Diagnosis not present

## 2019-03-12 DIAGNOSIS — M0609 Rheumatoid arthritis without rheumatoid factor, multiple sites: Secondary | ICD-10-CM | POA: Diagnosis not present

## 2019-03-12 DIAGNOSIS — E538 Deficiency of other specified B group vitamins: Secondary | ICD-10-CM | POA: Diagnosis not present

## 2019-03-12 DIAGNOSIS — L309 Dermatitis, unspecified: Secondary | ICD-10-CM | POA: Diagnosis not present

## 2019-03-12 DIAGNOSIS — I5181 Takotsubo syndrome: Secondary | ICD-10-CM | POA: Diagnosis not present

## 2019-03-12 DIAGNOSIS — I10 Essential (primary) hypertension: Secondary | ICD-10-CM | POA: Diagnosis not present

## 2019-03-12 DIAGNOSIS — E7849 Other hyperlipidemia: Secondary | ICD-10-CM | POA: Diagnosis not present

## 2019-03-12 DIAGNOSIS — M3500 Sicca syndrome, unspecified: Secondary | ICD-10-CM | POA: Diagnosis not present

## 2019-03-12 DIAGNOSIS — D508 Other iron deficiency anemias: Secondary | ICD-10-CM | POA: Diagnosis not present

## 2019-03-12 DIAGNOSIS — M8008XD Age-related osteoporosis with current pathological fracture, vertebra(e), subsequent encounter for fracture with routine healing: Secondary | ICD-10-CM | POA: Diagnosis not present

## 2019-03-12 DIAGNOSIS — M4185 Other forms of scoliosis, thoracolumbar region: Secondary | ICD-10-CM | POA: Diagnosis not present

## 2019-03-12 DIAGNOSIS — B0223 Postherpetic polyneuropathy: Secondary | ICD-10-CM | POA: Diagnosis not present

## 2019-03-12 DIAGNOSIS — E063 Autoimmune thyroiditis: Secondary | ICD-10-CM | POA: Diagnosis not present

## 2019-03-12 DIAGNOSIS — K219 Gastro-esophageal reflux disease without esophagitis: Secondary | ICD-10-CM | POA: Diagnosis not present

## 2019-03-12 DIAGNOSIS — G629 Polyneuropathy, unspecified: Secondary | ICD-10-CM | POA: Diagnosis not present

## 2019-03-15 DIAGNOSIS — M069 Rheumatoid arthritis, unspecified: Secondary | ICD-10-CM

## 2019-03-15 DIAGNOSIS — Z9181 History of falling: Secondary | ICD-10-CM

## 2019-03-15 DIAGNOSIS — L89151 Pressure ulcer of sacral region, stage 1: Secondary | ICD-10-CM

## 2019-03-15 DIAGNOSIS — I110 Hypertensive heart disease with heart failure: Secondary | ICD-10-CM

## 2019-03-15 DIAGNOSIS — M810 Age-related osteoporosis without current pathological fracture: Secondary | ICD-10-CM

## 2019-03-15 DIAGNOSIS — I5042 Chronic combined systolic (congestive) and diastolic (congestive) heart failure: Secondary | ICD-10-CM

## 2019-03-15 DIAGNOSIS — Z681 Body mass index (BMI) 19 or less, adult: Secondary | ICD-10-CM

## 2019-03-15 DIAGNOSIS — L89622 Pressure ulcer of left heel, stage 2: Principal | ICD-10-CM

## 2019-03-15 DIAGNOSIS — I480 Paroxysmal atrial fibrillation: Secondary | ICD-10-CM

## 2019-03-15 DIAGNOSIS — M359 Systemic involvement of connective tissue, unspecified: Secondary | ICD-10-CM

## 2019-03-15 DIAGNOSIS — E538 Deficiency of other specified B group vitamins: Secondary | ICD-10-CM

## 2019-03-15 DIAGNOSIS — E039 Hypothyroidism, unspecified: Secondary | ICD-10-CM

## 2019-03-15 DIAGNOSIS — I7300 Raynaud's syndrome without gangrene: Secondary | ICD-10-CM

## 2019-03-15 DIAGNOSIS — B0223 Postherpetic polyneuropathy: Secondary | ICD-10-CM

## 2019-03-15 DIAGNOSIS — E43 Unspecified severe protein-calorie malnutrition: Secondary | ICD-10-CM

## 2019-03-15 DIAGNOSIS — M5124 Other intervertebral disc displacement, thoracic region: Secondary | ICD-10-CM

## 2019-03-15 DIAGNOSIS — D509 Iron deficiency anemia, unspecified: Secondary | ICD-10-CM

## 2019-03-15 DIAGNOSIS — M4185 Other forms of scoliosis, thoracolumbar region: Secondary | ICD-10-CM | POA: Diagnosis not present

## 2019-03-15 DIAGNOSIS — M1990 Unspecified osteoarthritis, unspecified site: Secondary | ICD-10-CM | POA: Diagnosis not present

## 2019-03-15 DIAGNOSIS — E063 Autoimmune thyroiditis: Secondary | ICD-10-CM | POA: Diagnosis not present

## 2019-03-15 DIAGNOSIS — L309 Dermatitis, unspecified: Secondary | ICD-10-CM | POA: Diagnosis not present

## 2019-03-15 DIAGNOSIS — G629 Polyneuropathy, unspecified: Secondary | ICD-10-CM | POA: Diagnosis not present

## 2019-03-15 DIAGNOSIS — M5115 Intervertebral disc disorders with radiculopathy, thoracolumbar region: Secondary | ICD-10-CM | POA: Diagnosis not present

## 2019-03-15 DIAGNOSIS — I5181 Takotsubo syndrome: Secondary | ICD-10-CM | POA: Diagnosis not present

## 2019-03-15 DIAGNOSIS — I10 Essential (primary) hypertension: Secondary | ICD-10-CM | POA: Diagnosis not present

## 2019-03-15 DIAGNOSIS — D508 Other iron deficiency anemias: Secondary | ICD-10-CM | POA: Diagnosis not present

## 2019-03-15 DIAGNOSIS — K219 Gastro-esophageal reflux disease without esophagitis: Secondary | ICD-10-CM | POA: Diagnosis not present

## 2019-03-15 DIAGNOSIS — M0609 Rheumatoid arthritis without rheumatoid factor, multiple sites: Secondary | ICD-10-CM | POA: Diagnosis not present

## 2019-03-15 DIAGNOSIS — M8008XD Age-related osteoporosis with current pathological fracture, vertebra(e), subsequent encounter for fracture with routine healing: Secondary | ICD-10-CM | POA: Diagnosis not present

## 2019-03-15 DIAGNOSIS — M3500 Sicca syndrome, unspecified: Secondary | ICD-10-CM | POA: Diagnosis not present

## 2019-03-15 DIAGNOSIS — E7849 Other hyperlipidemia: Secondary | ICD-10-CM | POA: Diagnosis not present

## 2019-03-15 DIAGNOSIS — Z79891 Long term (current) use of opiate analgesic: Secondary | ICD-10-CM | POA: Diagnosis not present

## 2019-03-16 DIAGNOSIS — I7300 Raynaud's syndrome without gangrene: Secondary | ICD-10-CM | POA: Diagnosis not present

## 2019-03-16 DIAGNOSIS — M4185 Other forms of scoliosis, thoracolumbar region: Secondary | ICD-10-CM | POA: Diagnosis not present

## 2019-03-16 DIAGNOSIS — E063 Autoimmune thyroiditis: Secondary | ICD-10-CM | POA: Diagnosis not present

## 2019-03-16 DIAGNOSIS — M8008XD Age-related osteoporosis with current pathological fracture, vertebra(e), subsequent encounter for fracture with routine healing: Secondary | ICD-10-CM | POA: Diagnosis not present

## 2019-03-16 DIAGNOSIS — I5181 Takotsubo syndrome: Secondary | ICD-10-CM | POA: Diagnosis not present

## 2019-03-16 DIAGNOSIS — B0223 Postherpetic polyneuropathy: Secondary | ICD-10-CM | POA: Diagnosis not present

## 2019-03-16 DIAGNOSIS — M0609 Rheumatoid arthritis without rheumatoid factor, multiple sites: Secondary | ICD-10-CM | POA: Diagnosis not present

## 2019-03-16 DIAGNOSIS — Z79891 Long term (current) use of opiate analgesic: Secondary | ICD-10-CM | POA: Diagnosis not present

## 2019-03-16 DIAGNOSIS — G629 Polyneuropathy, unspecified: Secondary | ICD-10-CM | POA: Diagnosis not present

## 2019-03-16 DIAGNOSIS — M1990 Unspecified osteoarthritis, unspecified site: Secondary | ICD-10-CM | POA: Diagnosis not present

## 2019-03-16 DIAGNOSIS — D508 Other iron deficiency anemias: Secondary | ICD-10-CM | POA: Diagnosis not present

## 2019-03-16 DIAGNOSIS — Z9181 History of falling: Secondary | ICD-10-CM | POA: Diagnosis not present

## 2019-03-16 DIAGNOSIS — E43 Unspecified severe protein-calorie malnutrition: Secondary | ICD-10-CM | POA: Diagnosis not present

## 2019-03-16 DIAGNOSIS — E538 Deficiency of other specified B group vitamins: Secondary | ICD-10-CM | POA: Diagnosis not present

## 2019-03-16 DIAGNOSIS — I10 Essential (primary) hypertension: Secondary | ICD-10-CM | POA: Diagnosis not present

## 2019-03-16 DIAGNOSIS — M5115 Intervertebral disc disorders with radiculopathy, thoracolumbar region: Secondary | ICD-10-CM | POA: Diagnosis not present

## 2019-03-16 DIAGNOSIS — E7849 Other hyperlipidemia: Secondary | ICD-10-CM | POA: Diagnosis not present

## 2019-03-16 DIAGNOSIS — K219 Gastro-esophageal reflux disease without esophagitis: Secondary | ICD-10-CM | POA: Diagnosis not present

## 2019-03-16 DIAGNOSIS — M3500 Sicca syndrome, unspecified: Secondary | ICD-10-CM | POA: Diagnosis not present

## 2019-03-16 DIAGNOSIS — L309 Dermatitis, unspecified: Secondary | ICD-10-CM | POA: Diagnosis not present

## 2019-03-17 ENCOUNTER — Other Ambulatory Visit: Payer: Self-pay | Admitting: *Deleted

## 2019-03-17 ENCOUNTER — Ambulatory Visit: Payer: Self-pay | Admitting: *Deleted

## 2019-03-17 NOTE — Patient Outreach (Signed)
[facility]) Care Management  03/17/2019  Katherine Baird [birth year] [id number]   CSW was unable to reach pt today and will try again in 3-4 business days if no return call received.   Eduard Clos, MSW, [place] Worker  [place] [phone]

## 2019-03-19 DIAGNOSIS — G629 Polyneuropathy, unspecified: Secondary | ICD-10-CM | POA: Diagnosis not present

## 2019-03-19 DIAGNOSIS — D508 Other iron deficiency anemias: Secondary | ICD-10-CM | POA: Diagnosis not present

## 2019-03-19 DIAGNOSIS — I10 Essential (primary) hypertension: Secondary | ICD-10-CM | POA: Diagnosis not present

## 2019-03-19 DIAGNOSIS — M4185 Other forms of scoliosis, thoracolumbar region: Secondary | ICD-10-CM | POA: Diagnosis not present

## 2019-03-19 DIAGNOSIS — M0609 Rheumatoid arthritis without rheumatoid factor, multiple sites: Secondary | ICD-10-CM | POA: Diagnosis not present

## 2019-03-19 DIAGNOSIS — E43 Unspecified severe protein-calorie malnutrition: Secondary | ICD-10-CM | POA: Diagnosis not present

## 2019-03-19 DIAGNOSIS — M1990 Unspecified osteoarthritis, unspecified site: Secondary | ICD-10-CM | POA: Diagnosis not present

## 2019-03-19 DIAGNOSIS — M5115 Intervertebral disc disorders with radiculopathy, thoracolumbar region: Secondary | ICD-10-CM | POA: Diagnosis not present

## 2019-03-19 DIAGNOSIS — M8008XD Age-related osteoporosis with current pathological fracture, vertebra(e), subsequent encounter for fracture with routine healing: Secondary | ICD-10-CM | POA: Diagnosis not present

## 2019-03-19 DIAGNOSIS — I5181 Takotsubo syndrome: Secondary | ICD-10-CM | POA: Diagnosis not present

## 2019-03-19 DIAGNOSIS — M3500 Sicca syndrome, unspecified: Secondary | ICD-10-CM | POA: Diagnosis not present

## 2019-03-19 DIAGNOSIS — K219 Gastro-esophageal reflux disease without esophagitis: Secondary | ICD-10-CM | POA: Diagnosis not present

## 2019-03-19 DIAGNOSIS — E7849 Other hyperlipidemia: Secondary | ICD-10-CM | POA: Diagnosis not present

## 2019-03-19 DIAGNOSIS — Z9181 History of falling: Secondary | ICD-10-CM | POA: Diagnosis not present

## 2019-03-19 DIAGNOSIS — L309 Dermatitis, unspecified: Secondary | ICD-10-CM | POA: Diagnosis not present

## 2019-03-19 DIAGNOSIS — E063 Autoimmune thyroiditis: Secondary | ICD-10-CM | POA: Diagnosis not present

## 2019-03-19 DIAGNOSIS — B0223 Postherpetic polyneuropathy: Secondary | ICD-10-CM | POA: Diagnosis not present

## 2019-03-19 DIAGNOSIS — E538 Deficiency of other specified B group vitamins: Secondary | ICD-10-CM | POA: Diagnosis not present

## 2019-03-19 DIAGNOSIS — I7300 Raynaud's syndrome without gangrene: Secondary | ICD-10-CM | POA: Diagnosis not present

## 2019-03-19 DIAGNOSIS — Z79891 Long term (current) use of opiate analgesic: Secondary | ICD-10-CM | POA: Diagnosis not present

## 2019-03-22 DIAGNOSIS — M4185 Other forms of scoliosis, thoracolumbar region: Secondary | ICD-10-CM | POA: Diagnosis not present

## 2019-03-22 DIAGNOSIS — M3500 Sicca syndrome, unspecified: Secondary | ICD-10-CM | POA: Diagnosis not present

## 2019-03-22 DIAGNOSIS — G629 Polyneuropathy, unspecified: Secondary | ICD-10-CM | POA: Diagnosis not present

## 2019-03-22 DIAGNOSIS — I5181 Takotsubo syndrome: Secondary | ICD-10-CM | POA: Diagnosis not present

## 2019-03-22 DIAGNOSIS — E063 Autoimmune thyroiditis: Secondary | ICD-10-CM | POA: Diagnosis not present

## 2019-03-22 DIAGNOSIS — M8008XD Age-related osteoporosis with current pathological fracture, vertebra(e), subsequent encounter for fracture with routine healing: Secondary | ICD-10-CM | POA: Diagnosis not present

## 2019-03-22 DIAGNOSIS — M5115 Intervertebral disc disorders with radiculopathy, thoracolumbar region: Secondary | ICD-10-CM | POA: Diagnosis not present

## 2019-03-22 DIAGNOSIS — E43 Unspecified severe protein-calorie malnutrition: Secondary | ICD-10-CM | POA: Diagnosis not present

## 2019-03-22 DIAGNOSIS — M1990 Unspecified osteoarthritis, unspecified site: Secondary | ICD-10-CM | POA: Diagnosis not present

## 2019-03-22 DIAGNOSIS — E538 Deficiency of other specified B group vitamins: Secondary | ICD-10-CM | POA: Diagnosis not present

## 2019-03-22 DIAGNOSIS — I10 Essential (primary) hypertension: Secondary | ICD-10-CM | POA: Diagnosis not present

## 2019-03-22 DIAGNOSIS — Z79891 Long term (current) use of opiate analgesic: Secondary | ICD-10-CM | POA: Diagnosis not present

## 2019-03-22 DIAGNOSIS — L309 Dermatitis, unspecified: Secondary | ICD-10-CM | POA: Diagnosis not present

## 2019-03-22 DIAGNOSIS — E7849 Other hyperlipidemia: Secondary | ICD-10-CM | POA: Diagnosis not present

## 2019-03-22 DIAGNOSIS — I7300 Raynaud's syndrome without gangrene: Secondary | ICD-10-CM | POA: Diagnosis not present

## 2019-03-22 DIAGNOSIS — K219 Gastro-esophageal reflux disease without esophagitis: Secondary | ICD-10-CM | POA: Diagnosis not present

## 2019-03-22 DIAGNOSIS — B0223 Postherpetic polyneuropathy: Secondary | ICD-10-CM | POA: Diagnosis not present

## 2019-03-22 DIAGNOSIS — M0609 Rheumatoid arthritis without rheumatoid factor, multiple sites: Secondary | ICD-10-CM | POA: Diagnosis not present

## 2019-03-22 DIAGNOSIS — D508 Other iron deficiency anemias: Secondary | ICD-10-CM | POA: Diagnosis not present

## 2019-03-22 DIAGNOSIS — Z9181 History of falling: Secondary | ICD-10-CM | POA: Diagnosis not present

## 2019-03-23 ENCOUNTER — Ambulatory Visit: Payer: Self-pay | Admitting: *Deleted

## 2019-03-23 DIAGNOSIS — Z9181 History of falling: Secondary | ICD-10-CM | POA: Diagnosis not present

## 2019-03-23 DIAGNOSIS — M545 Low back pain: Secondary | ICD-10-CM | POA: Diagnosis not present

## 2019-03-23 DIAGNOSIS — R262 Difficulty in walking, not elsewhere classified: Secondary | ICD-10-CM | POA: Diagnosis not present

## 2019-03-23 DIAGNOSIS — R2689 Other abnormalities of gait and mobility: Secondary | ICD-10-CM | POA: Diagnosis not present

## 2019-03-23 DIAGNOSIS — G629 Polyneuropathy, unspecified: Secondary | ICD-10-CM | POA: Diagnosis not present

## 2019-03-23 DIAGNOSIS — E43 Unspecified severe protein-calorie malnutrition: Secondary | ICD-10-CM | POA: Diagnosis not present

## 2019-03-23 DIAGNOSIS — E559 Vitamin D deficiency, unspecified: Secondary | ICD-10-CM | POA: Diagnosis not present

## 2019-03-23 DIAGNOSIS — M546 Pain in thoracic spine: Secondary | ICD-10-CM | POA: Diagnosis not present

## 2019-03-23 DIAGNOSIS — R7989 Other specified abnormal findings of blood chemistry: Secondary | ICD-10-CM | POA: Diagnosis not present

## 2019-03-23 DIAGNOSIS — I5181 Takotsubo syndrome: Secondary | ICD-10-CM | POA: Diagnosis not present

## 2019-03-23 DIAGNOSIS — E063 Autoimmune thyroiditis: Secondary | ICD-10-CM | POA: Diagnosis not present

## 2019-03-24 DIAGNOSIS — M4185 Other forms of scoliosis, thoracolumbar region: Secondary | ICD-10-CM | POA: Diagnosis not present

## 2019-03-24 DIAGNOSIS — L89150 Pressure ulcer of sacral region, unstageable: Secondary | ICD-10-CM | POA: Diagnosis not present

## 2019-03-24 DIAGNOSIS — M0609 Rheumatoid arthritis without rheumatoid factor, multiple sites: Secondary | ICD-10-CM | POA: Diagnosis not present

## 2019-03-24 DIAGNOSIS — L309 Dermatitis, unspecified: Secondary | ICD-10-CM | POA: Diagnosis not present

## 2019-03-24 DIAGNOSIS — G629 Polyneuropathy, unspecified: Secondary | ICD-10-CM | POA: Diagnosis not present

## 2019-03-24 DIAGNOSIS — Z9181 History of falling: Secondary | ICD-10-CM | POA: Diagnosis not present

## 2019-03-24 DIAGNOSIS — M5115 Intervertebral disc disorders with radiculopathy, thoracolumbar region: Secondary | ICD-10-CM | POA: Diagnosis not present

## 2019-03-24 DIAGNOSIS — E43 Unspecified severe protein-calorie malnutrition: Secondary | ICD-10-CM | POA: Diagnosis not present

## 2019-03-24 DIAGNOSIS — M1990 Unspecified osteoarthritis, unspecified site: Secondary | ICD-10-CM | POA: Diagnosis not present

## 2019-03-24 DIAGNOSIS — I10 Essential (primary) hypertension: Secondary | ICD-10-CM | POA: Diagnosis not present

## 2019-03-24 DIAGNOSIS — M3500 Sicca syndrome, unspecified: Secondary | ICD-10-CM | POA: Diagnosis not present

## 2019-03-24 DIAGNOSIS — E538 Deficiency of other specified B group vitamins: Secondary | ICD-10-CM | POA: Diagnosis not present

## 2019-03-24 DIAGNOSIS — I7300 Raynaud's syndrome without gangrene: Secondary | ICD-10-CM | POA: Diagnosis not present

## 2019-03-24 DIAGNOSIS — Z79891 Long term (current) use of opiate analgesic: Secondary | ICD-10-CM | POA: Diagnosis not present

## 2019-03-24 DIAGNOSIS — I5181 Takotsubo syndrome: Secondary | ICD-10-CM | POA: Diagnosis not present

## 2019-03-24 DIAGNOSIS — B0223 Postherpetic polyneuropathy: Secondary | ICD-10-CM | POA: Diagnosis not present

## 2019-03-24 DIAGNOSIS — E063 Autoimmune thyroiditis: Secondary | ICD-10-CM | POA: Diagnosis not present

## 2019-03-24 DIAGNOSIS — D508 Other iron deficiency anemias: Secondary | ICD-10-CM | POA: Diagnosis not present

## 2019-03-24 DIAGNOSIS — M8008XD Age-related osteoporosis with current pathological fracture, vertebra(e), subsequent encounter for fracture with routine healing: Secondary | ICD-10-CM | POA: Diagnosis not present

## 2019-03-24 DIAGNOSIS — K219 Gastro-esophageal reflux disease without esophagitis: Secondary | ICD-10-CM | POA: Diagnosis not present

## 2019-03-24 DIAGNOSIS — E7849 Other hyperlipidemia: Secondary | ICD-10-CM | POA: Diagnosis not present

## 2019-03-25 ENCOUNTER — Other Ambulatory Visit: Payer: Self-pay | Admitting: *Deleted

## 2019-03-25 NOTE — Patient Outreach (Addendum)
[facility]) Care Management  03/25/2019  Katherine Baird [facility] June 08, 1951 [id number]   Telephone follow up call  [facility] complex care management   Patient with recent ED visit to [facility] on 7/9 left without being seen after triage   as recommended by patient PCP office at [facility] primary care due to concern with bluish discoloration to toes, pain and pressure wounds to buttock and left heel and concern for blockage . On 7/10 patient to  ED visit to [facility] due to concern regarding left heel pain, home health PT,OT, RN referral placed as well as referral to vascular doctor.    PMHx noted :68 year old femalesignificant for Hypertension , Diastolic Heart failure, Takotsubo cardiomyopathy. 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   Subjective  Unsuccessful outreach call to patient , no answer able to leave a HIPAA compliant message for return call.  Care Coordination  Successful outreach call to [facility] home health , able to speak with physical therapist, Katherine Baird to collobarate regarding patient progress, she discussed patient has poor balance and depending on day she can be min assist to max with standing up  . Patient is  progressing to walking up to 60 feet, and they are working on standing , she now has a gait belt for caregiver use and has requested order from MD of sit to stand lift when they receive prescription she will send it their medical supply office ( patient to get prescription during her office visit on 7/28) Patient now has a bedside commode for use ,occupational therapy is working with patient as well , she has poor hand grip due to neuropathy.  Katherine Baird discussed that home health RN is following as well for wound care to left heel as sacral  area.    1400  Patient returned call, she discussed that she is doing better. She is very pleased to have [facility] home health working with her and feels she is making good progress. She discussed having visit from OT, PT and the nurse states discussing with them not to come on the same day.  Patient discussed  recent visit to PCP, Dr. Janene Baird and she plans to refer her to [facility] for what she believes is follow up with regarding her problem with walking ( noted referral to [place] ) . Patient identifies as Katherine Baird.   She further discussed;  Self health management of chronic conditions, High fall risk, chronic pain, takotsubo cardiomyopathy, malnutrition.   High fall risk/ Mobility dysfunction .  Patient reports making progress with home therapy of physical and occupational therapy. She has a bedside commode that will be given to her and MD office prescribing a sit to stand lift that will help her with standing. She discussing standing is difficult as well as her balance is still difficult. Patient denies having recent falls. She continues to have 24 hours support in home.  Patient states she has been provided with exercises to work on and she as been doing that, she also pedals around home in her wheelchair, discussed how continued exercise and movement benefits. Reviewed safety precautions not trying to get up without assistance of her caregivers until she gets stronger.  Malnutrition Patient very excited to tell me of her recent weight of 80 lbs , 2 pound increase since her  office visit 2 weeks ago at different office. Patient states that she is unable to balance well enough to stand on scales at home. She reports working on eating 3 meals a day, even if she doesn't want to. She has started back drinking ensure with miralax to avoid constipation that she says ensure causes.  Noted MD referral to [facility]  regarding nutrition concerns . Chronic Pain   Patient continues with current medication plan and reports pain tolerable and she is taking medication as prescribed and has upcoming appointment with Katherine Baird.  Wounds, left heel, sacral  Patient states that she believes wounds are looking better, she states at this time no dressing are on areas.  Home health RN is providing wound care reinforced how good nutrition helps with healing of wound skin issues.   Takotsubo Cardiomyopathy Patient denies any chest pain or discomfort, no swelling. She reports receiving a scale and blood pressure monitor system , she believes it came from her Katherine Baird health care insurance and not home health . She states her caregivers have been monitoring her blood pressure daily and her readings are good 100/70. She reports that she is not able to balance on the scales at home at home yet but that is her goal . Reviewed condition of cardiomyopathy and measures to monitor to identify worsening symptoms, such as sudden weight gain, of 3 pounds in a day and 5 in week, shortness of breath , chest pain and plan of action to notify MD.    Patient denies any other new concerns at this time encouraged to seek medical team for new concerns.     Plan  Will plan return call in the next month for follow up referral to [place] and continue education and support with chronic conditons.    THN CM Care Plan Problem One     Most Recent Value  Care Plan Problem One  High risk for readmission related to malnutrition ,falls related to neuropathy   [care plan reinitiated. ]  Role Documenting the Problem One  Care Management [place] for Problem One  Active  THN Long Term Goal   Patient will not experience a fall over the next 60  days   THN Long Term Goal Start Date  02/17/19 [name] extended, recent ED visit concerns ]  Interventions for Problem One Long Term Goal  Verified patient being active with home health  encouraged continued participation in home therapy to increased  strenght and improve balance.   THN CM Short Term Goal #1   Patient will be able to report being active with home health over the next 14 days   THN CM Short Term Goal #1 Start Date  03/02/19 [name] restarted ]  [facility] CM Short Term Goal #1 Met Date  03/11/19  [facility] CM Short Term Goal #2   Patient will be able to report including nutrition supplement drink at least twice daily over the next 30 days   THN CM Short Term Goal #2 Start Date  03/08/19  Interventions for Short Term Goal #2  Reviewed progress with nutrition, encouraged continued to drink ensure as a help to nutrition.     THN CM Care Plan Problem Two     Most Recent Value  Care Plan Problem Two  Knowledge defict related to self care managment of pressure wound and preventions  Role Documenting the Problem Two  Care Management Coordinator  Care Plan for Problem Two  Active  Interventions for Problem Two Long  Term Goal   Reviewed upcoming appointments and new referral to [facility] to help with managing wounds   THN Long Term Goal  Over the next 45 days patient will be able to report wound healing   THN Long Term Goal Start Date  03/08/19  [facility] CM Short Term Goal #1   Patient will be able to reports attending all medical appointments over the next 14 days.   THN CM Short Term Goal #1 Start Date  03/08/19  THN CM Short Term Goal #1 Met Date   03/25/19  THN CM Short Term Goal #2   Over the next 30 days patient will be able to demonstrate 2 measures toward  pressure wound prevention and healing .   THN CM Short Term Goal #2 Start Date  03/08/19  Interventions for Short Term Goal #2  discussed progress of wound healing , encouraged adherence to wound management by pressure reduction, changing positons during the day and rationale why it is important     [facility] CM Care Plan Problem Three     Most Recent Value  Care Plan Problem Three  Self care knowledge related to chronic conditions of Takotsubo cardiomyopathy, diastolic heart failure managment   Role  Documenting the Problem Three  Care Management Coordinator  Care Plan for Problem Three  Active  THN Long Term Goal   Over the next 31 days patient will be able to demonstate 2 measures of self care management of chronic conditions  THN Long Term Goal Start Date  03/11/19  Interventions for Problem Three Long Term Goal  Reviewed recent visit with cardiology, taking medication  as presribed   THN CM Short Term Goal #1   Over the next 30 days patient will be able to identify worsening symptoms of heart failure and action plan    THN CM Short Term Goal #1 Start Date  03/11/19  Interventions for Short Term Goal #1  reinforced symptoms of heart failure symptoms and importance of notifying MD of new concerns   THN CM Short Term Goal #2   Patient will be able to monitor blood pressure at least once weekly over the next 30 days   THN CM Short Term Goal #2 Start Date  03/11/19  Interventions for Short Term Goal #2  reviewed progress with monitoring blood pressure and recent reading , encouraged to continue to montior and keep a record.       Joylene Draft, RN, [place] Management Coordinator  [phone]- Mobile [phone]- Toll Free Main Office

## 2019-03-26 ENCOUNTER — Other Ambulatory Visit: Payer: Self-pay | Admitting: *Deleted

## 2019-03-26 DIAGNOSIS — E538 Deficiency of other specified B group vitamins: Secondary | ICD-10-CM | POA: Diagnosis not present

## 2019-03-26 DIAGNOSIS — Z79891 Long term (current) use of opiate analgesic: Secondary | ICD-10-CM | POA: Diagnosis not present

## 2019-03-26 DIAGNOSIS — M3500 Sicca syndrome, unspecified: Secondary | ICD-10-CM | POA: Diagnosis not present

## 2019-03-26 DIAGNOSIS — I10 Essential (primary) hypertension: Secondary | ICD-10-CM | POA: Diagnosis not present

## 2019-03-26 DIAGNOSIS — M8008XD Age-related osteoporosis with current pathological fracture, vertebra(e), subsequent encounter for fracture with routine healing: Secondary | ICD-10-CM | POA: Diagnosis not present

## 2019-03-26 DIAGNOSIS — E43 Unspecified severe protein-calorie malnutrition: Secondary | ICD-10-CM | POA: Diagnosis not present

## 2019-03-26 DIAGNOSIS — K219 Gastro-esophageal reflux disease without esophagitis: Secondary | ICD-10-CM | POA: Diagnosis not present

## 2019-03-26 DIAGNOSIS — M0609 Rheumatoid arthritis without rheumatoid factor, multiple sites: Secondary | ICD-10-CM | POA: Diagnosis not present

## 2019-03-26 DIAGNOSIS — Z9181 History of falling: Secondary | ICD-10-CM | POA: Diagnosis not present

## 2019-03-26 DIAGNOSIS — E7849 Other hyperlipidemia: Secondary | ICD-10-CM | POA: Diagnosis not present

## 2019-03-26 DIAGNOSIS — L309 Dermatitis, unspecified: Secondary | ICD-10-CM | POA: Diagnosis not present

## 2019-03-26 DIAGNOSIS — M4185 Other forms of scoliosis, thoracolumbar region: Secondary | ICD-10-CM | POA: Diagnosis not present

## 2019-03-26 DIAGNOSIS — I5181 Takotsubo syndrome: Secondary | ICD-10-CM | POA: Diagnosis not present

## 2019-03-26 DIAGNOSIS — I7300 Raynaud's syndrome without gangrene: Secondary | ICD-10-CM | POA: Diagnosis not present

## 2019-03-26 DIAGNOSIS — G629 Polyneuropathy, unspecified: Secondary | ICD-10-CM | POA: Diagnosis not present

## 2019-03-26 DIAGNOSIS — E063 Autoimmune thyroiditis: Secondary | ICD-10-CM | POA: Diagnosis not present

## 2019-03-26 DIAGNOSIS — B0223 Postherpetic polyneuropathy: Secondary | ICD-10-CM | POA: Diagnosis not present

## 2019-03-26 DIAGNOSIS — M1990 Unspecified osteoarthritis, unspecified site: Secondary | ICD-10-CM | POA: Diagnosis not present

## 2019-03-26 DIAGNOSIS — M5115 Intervertebral disc disorders with radiculopathy, thoracolumbar region: Secondary | ICD-10-CM | POA: Diagnosis not present

## 2019-03-26 DIAGNOSIS — D508 Other iron deficiency anemias: Secondary | ICD-10-CM | POA: Diagnosis not present

## 2019-03-26 NOTE — Patient Outreach (Signed)
[facility]) Care Management  03/26/2019  Chermaine Schnyder 04/15/1951 [id number]   CSW spoke with pt by phone who reports her depression is better; rates it a "3 or 4" with 10 being the worst.  Pt shared with CSW that she is still struggling with mobility and balance and her PCP is referring her to [facility] for further evaluation/testing. "She says it just doesn't add up". Pt is still interested in mental health therapy from a provider near her home when she is able to get out more/safely.  She has around the clock in home care and seems pleased with that.   CSW offered support and encouragement and will plan a follow up call in 10-14 days.   Eduard Clos, MSW, [place] Worker  [place] [phone]

## 2019-03-29 ENCOUNTER — Ambulatory Visit: Payer: Medicare Other | Admitting: *Deleted

## 2019-04-06 ENCOUNTER — Other Ambulatory Visit: Payer: Self-pay | Admitting: *Deleted

## 2019-04-07 NOTE — Patient Outreach (Signed)
[facility]) Care Management  04/07/2019  Katherine Baird 29-Nov-1950 [id number]   CSW was able to reach pt by phone who reports she is doing well. She was heading to a medical appointment and awaits call for appointment with MD at [facility] for neuro follow up.   CSW will plan a callback for updates next week.  Eduard Clos, MSW, [place] Worker  [place] [phone]

## 2019-04-08 ENCOUNTER — Other Ambulatory Visit: Payer: Self-pay | Admitting: *Deleted

## 2019-04-08 NOTE — Patient Outreach (Signed)
[facility]) Care Management  04/08/2019  Katherine Baird 11-18-1950 [id number]   Telephone assessment   [facility] complex care management   Patient with recent ED visit to [facility] on 7/9 left without being seen after triage   as recommended by patient PCP office at [facility] primary care due to concern with bluish discoloration to toes, pain and pressure wounds to buttock and left heel and concern for blockage . On 7/10 patient to  ED visit to [facility] due to concern regarding left heel pain, home health PT,OT, RN referral placed as well as referral to vascular doctor.    PMHx noted :68 year old femalesignificant for Hypertension , Diastolic Heart failure,Takotsubo cardiomyopathy. 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   Subjective Unsuccessful outreach call to patient , no answer able to leave a HIPAA compliant message for return call  Plan If no return call on today will plan return call to patient in the next 4 business days,   Joylene Draft, RN, [place] Management Coordinator  [phone]- Mobile [phone]- [place]

## 2019-04-13 ENCOUNTER — Other Ambulatory Visit: Payer: Self-pay | Admitting: *Deleted

## 2019-04-13 ENCOUNTER — Encounter: Payer: Self-pay | Admitting: *Deleted

## 2019-04-13 NOTE — Patient Outreach (Signed)
[facility]) Care Management  04/13/2019  Katherine Baird Mar 05, 1951 [id number]   Telephone follow up call  [facility] complex care management     Patient with t ED visit to [facility] on 7/9 left without being seen after triage   as recommended by patient PCP office at [facility] primary care due to concern with bluish discoloration to toes, pain and pressure wounds to buttock and left heel and concern for blockage . On 7/10 patient to  ED visit to [facility] due to concern regarding left heel pain, home health PT,OT, RN   PMHx noted :68 year old femalesignificant for Hypertension , Diastolic Heart failure,Takotsubo cardiomyopathy. 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   Subjective Successful outreach call to patient, she discussed that she is doing much better.  She states that she is making progress with therapy, working on making turns with her walker, Patient reports that she still nervous due do her balance with walking with anyone other that therapist in home. Her caregivers are able to assist her with standing to pivot to chair, and commode .  Patient further discussed :  High fall risk/Balance concerns /Chronic Pain, DDD, cervical and lumbar  No recent falls, continues to participate in home therapy exercises with PT, OT . Patient continues visit with psychiatrist and has next appointment with steroid injection scheduled.    Nutrition Patient discussed that she continues to work on eating meals 3 times a day, she has supportive personal care workers that assist her as needed and make sure that she eats. She discussed that she has gotten away from drinking the ensure daily, , she gets full quick but know she needs to get back to that.   Discussed  nutrition importance in wound healing and protein to help with increasing strength. Discussed with patient communication with [employer] representative regarding nutrition products and patient concern with ensure causing constipation.  Discussed options of use of Glucerna instead of Ensure, as it may do better with digestion and will provide same calories and nutrition. Discussed with patient also product Juven liquid nutrition to support wound healing . Patient is agreeable to trying products explained Juven  that it could be mailed to her home.  Patient does not weigh at home due to difficulty balancing.  Sacral and left heel wound  Patient discussed left heel and sacral areas healing. She voiced wearing heel boot at night to reduce pressure on heel. Patient reports sitting on sofa during the day and shifting her weight frequently, changing positions and getting into her wheel chair.  Patient discussed having discoloration on of feet is still ongoing , no increase in pain, she reports being told  at recent visit at Dr. Bertram Millard office report having good pulse present.  Takotsubo  Cardiomyopathy/Cardiomyopathy Patient reports having telemonitoring system through her insurance company, she is unable to monitor her weights due to balancing problem , but with the help of her personal helpers she monitors blood pressure, pulse and oxygen and enters information on app that is on caregiver phone to submit.  Patient denies chest pain or shortness of breath.   Patient notes her sister Katherine Baird  with recent visit and gives okay to follow.  Phone call to Neoma Laming she also discussed patient making some progress, more balanced, she does better with using fork/spoon. She discussed previous  conversation with referral to another  doctor at [facility] regarding patient balance problems, she is unsure if [name] will refer patient or Dr.Shah.    Call to [place] office able to speak with nurse Magda Paganini, discussed  concern regarding patient anticipating  Referral to [facility] neurology regarding her balance concerns;Magda Paganini states she is able to see referral being sent and attempt to contact patient being made on 8/5. She was able to provide telephone number to [facility] scheduling hub for Neurology [phone]. Discussed with Magda Paganini patient  continued observation of her discoloration of feet, denies increase in pain or swelling, reviewed note at visit with Dr.Chasins office of observed decreased cap refill, 2+ pulses . Discussed with Magda Paganini concern regarding patient ongoing nutrition status, shared with Rushie Goltz nutrition support  available to help with wound healing Juven and suggestion of trying Glucerna ( some patients do better with digestion than with ensure ) for protein and calorie support if patient does not tolerate ensure even if she does not have diabetes it will supply calories and protein patient is agreeable to trying, explained she can be sent trial of Juven from [employer].Magda Paganini with will provide information to [name] , shared my contact number for follow up if needed.   Care coordination  Pl[facility] to speak with RN for follow up on wound healing and progression  , spoke with Delana Meyer, manager she provided contact number for Horris Latino, physical therapist.Placed call to Horris Latino able to leave a HIPPA compliant message for return call .    Plan Returned call to patient, to provide contact number from  [place] office for [facility] for Neurology referral that has been sent. Will plan follow up call to patient within  the next 3 weeks.  Will plan care coordination call to [facility] home health in the  week if no return call . Will send message to [facility] regarding patient agreeable to trying Juven for trial supply.  Discussed with patient as care management coordination goals are met, will transition to health coach for ongoing continued disease management of chronic  conditions .     [facility] CM Care Plan Problem One     Most Recent Value  Care Plan Problem One  High risk for readmission related to malnutrition ,falls related to neuropathy   [care plan reinitiated. ]  Role Documenting the Problem One  Care Management [place] for Problem One  Active  [facility]N Long Term Goal   Patient will not experience a fall over the next 60  days   [facility]N Long Term Goal Start Date  02/17/19 [name] extended, recent ED visit concerns ]  Interventions for Problem One Long Term Goal  Reviewed current clinical state, reinforced role of nutrition, continued participation in home therapy to aid fall prevention .   [facility] CM Short Term Goal #1   Patient will be able to report being active with home health over the next 14 days   [facility] CM Short Term Goal #1 Start Date  03/02/19 [name] restarted ]  [facility] CM Short Term Goal #1 Met Date  03/11/19  [facility] CM Short Term Goal #2   Patient will be able to report including nutrition supplement drink at least twice daily over the next 30 days   [facility]N CM Short Term Goal #2 Start Date  04/13/19 [facility] reset ]  Interventions for Short Term Goal #2  Discussed with patient benefit of supplementing meals with nutrition drink to supply protein to help with  wound healing and increase muscle strength for improved balance.     [facility] CM Care Plan Problem Two     Most Recent Value  Care Plan Problem Two  Knowledge defict related to self care managment of pressure wound and preventions  Role Documenting the Problem Two  Care Management Coordinator  Care Plan for Problem Two  Active  Interventions for Problem Two Long Term Goal   Discussed with patient wound healing progress, placed call to [facility] to follow up. emphasized importance of ongoing nutrition on wound healing. ,   THN Long Term Goal  Over the next 45 days patient will be able to report wound healing   THN Long Term Goal Start Date  03/08/19  [facility] CM Short Term Goal #1   Patient will be  able to reports attending all medical appointments over the next 14 days.   [facility]N CM Short Term Goal #1 Start Date  03/08/19  [facility] CM Short Term Goal #1 Met Date   03/25/19  [facility] CM Short Term Goal #2   Over the next 30 days patient will be able to demonstrate 2 measures toward  pressure wound prevention and healing .   [facility]N CM Short Term Goal #2 Start Date  03/08/19  [facility] CM Short Term Goal #2 Met Date  04/13/19    [facility] CM Care Plan Problem Three     Most Recent Value  Care Plan Problem Three  Self care knowledge related to chronic conditions of Takotsubo cardiomyopathy, diastolic heart failure managment   Role Documenting the Problem Three  Care Management Coordinator  Care Plan for Problem Three  Active  THN Long Term Goal   Over the next 31 days patient will be able to demonstate 2 measures of self care management of chronic conditions  THN Long Term Goal Start Date  03/11/19  Interventions for Problem Three Long Term Goal  Reviewed with teach back , worsening symptoms to notify MD, increased swelling, shortness of breath, chest pain.   [facility]N CM Short Term Goal #1   Over the next 30 days patient will be able to identify worsening symptoms of heart failure and action plan    THN CM Short Term Goal #1 Start Date  03/11/19  [facility] CM Short Term Goal #1 Met Date  04/13/19  [facility] CM Short Term Goal #2   Patient will be able to monitor blood pressure at least once weekly over the next 30 days   THN CM Short Term Goal #2 Start Date  03/11/19  [facility] CM Short Term Goal #2 Met Date  04/13/19      Joylene Draft, RN, [place] Management Coordinator  [phone]- Mobile [phone]- [place]

## 2019-04-15 ENCOUNTER — Ambulatory Visit: Payer: Self-pay | Admitting: *Deleted

## 2019-04-16 ENCOUNTER — Other Ambulatory Visit: Payer: Self-pay | Admitting: *Deleted

## 2019-04-16 NOTE — Patient Outreach (Signed)
[facility]) Care Management  04/16/2019  Katherine Baird [birth year] [id number]  Care Coordination   Successful outreach call to [facility] home health, able to speak with patient physical therapist Gae Bon; she had visit with patient on today.  Therapist reports patient making good progress, with getting herself up to side of bed , needs assistance with getting legs back into bed. Patient has 24 hour caregiver support.  Patient has progressed with walking 10 feet to 80 feet with walker and assist during visits. .  Physical therapy and occupational therapy continues with home visit plan, she reports wounds healed and pressure relieving pressures reinforced.    Plan Will plan follow up call to patient in the next week, verify scheduled visit for neurology referral.   Joylene Draft, RN, [place] Management Coordinator  [phone]- Mobile [phone]- [place]

## 2019-04-22 ENCOUNTER — Other Ambulatory Visit: Payer: Self-pay | Admitting: *Deleted

## 2019-04-22 NOTE — Patient Outreach (Signed)
[facility]) Care Management  04/22/2019  Katherine Baird 1951-01-18 [id number]   Telephone assessment   Patient with t ED visit to [facility] 7/9left without being seen after triage as recommended by patient PCP office at [facility] primary care due to concern with bluish discoloration to toes, pain and pressure wounds to buttock and left heel and concern for blockage . On 7/10 patient to ED visit to [facility] due to concern regarding left heel pain, home health PT,OT, RN  PMHx noted :68 year old femalesignificant for Hypertension , Diastolic Heart failure,Takotsubo cardiomyopathy. 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   Subjective  Successful outreach call to patient , she reports feeling pretty good on today She discussed having an injection as physical rehab and medicine on yesterday for her back and right leg nerve  pain. She discussed that she thinks it has helped some. She states for 3 days after that she is to avoid , bending , and cannot have therapy . She plans to resume therapy again on Monday.   High fall risk, Chronic Pain/DDD, Cervical and lumbar radiculitis, Decreased mobility.  No recent falls, patient reports feeling stronger and finally seeing results of therapy.  Discussed with patient follow up scheduling visit for referral to [facility] Neurology for another opinion as she requested from her PCP,   she states that she decided to wait until her appointment next month with Dr.Shah neurology to discuss.   Malnutrition  Patient continues to work toward increasing nutrition, reports that she is eating way more than she was even 3 months ago. She shared eating times a day , getting protein , vegetables and balanced meals in . She reports  gradually adding back in boost this time , but she has full quick after meals but determined to continue to try. She is encouraged with her weight up to 80 lbs at last office visit and anticipates at next visit she will have added additional .  Sacral and heel skin concerns  Patient reports healing of left heel and wounds, no dressing in place, she continues to shift weight and change positions during the day, wearing heel boot some nights to reduce pressure. She discussed receiving call from PCP  office regarding picking up Juven nutrition supplement to support wound healing , she states that she may not need that at this time but will pick it up . She then states having a cut on her right arm, states that it happened getting out of the shower and her arm caught on grab bar in the shower, tearing her skin. She discussed site improved, they put tegaderm dressing for a few days , now they have if off, states that it looks good, improved color, decreased redness, no drainage, swelling .  Taktsubo cardiomyopathy With the help of her caregivers she reports monitoring blood pressures and entering information into system, app. She reports readings within her usual range. 100/60. She is unable to weigh on scales due to balance   Patient reports overall improvement in her nutrition and mobility over the last month.   Provider Appointments Dr.Kernodle , 9/8, Rheumatology Dr.Shah,9/21, Neurology  Dr.Troxler, Podiatry [name], 9/29,PCP    Plan Will plan follow up call in the next month for assessment  of goals and identify  care coordination needs      [name], RN,  [place] Management Coordinator  [phone]- Mobile [phone]- Toll Free Main Office

## 2019-04-23 ENCOUNTER — Other Ambulatory Visit: Payer: Self-pay | Admitting: *Deleted

## 2019-04-23 NOTE — Patient Outreach (Signed)
[name]  04/23/2019  Katherine Baird 1951/06/13 [id number]   Multidisciplinary case discussion    PMHx noted :68 year old femalesignificant for Hypertension , Diastolic Heart failure,Takotsubo cardiomyopathy. 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   Patient case was discussed in a multidisciplinary case discussion     Plan Will continue to provide education and support on fall prevention safety, nutrition education support and assist/encourage  with follow through on [place] neurology referral.  Will plan follow up call in the next month.   Joylene Draft, RN, [place] Management Coordinator  [phone]- Mobile [phone]- Toll Free Main Office

## 2019-04-27 IMAGING — MR MR THORACIC SPINE W/O CM
5 of 6 series · 30 of 48 positions shown · non-contrast
Comparison: Chest two view 07/08/2018

CLINICAL DATA: Back pain

EXAM:
MRI THORACIC SPINE WITHOUT CONTRAST
TECHNIQUE: Multiplanar, multisequence MR imaging of the thoracic spine was
performed. No intravenous contrast was administered.

[Series 16: T1 · sagittal · 5.0mm · 1.88mm/px · 2 of 9 slices shown (1 of 2)]
[im 1/9]
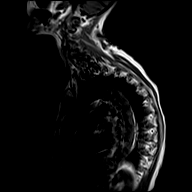
[im 9/9]
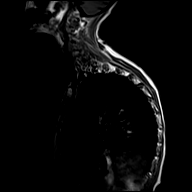

[Series 17: T2 · sagittal · 3.0mm · 0.94mm/px · 6 of 20 slices shown (1 of 2)]
[im 1/20]
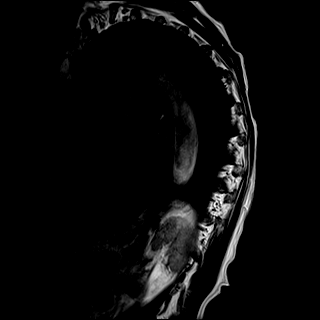
[im 4/20]
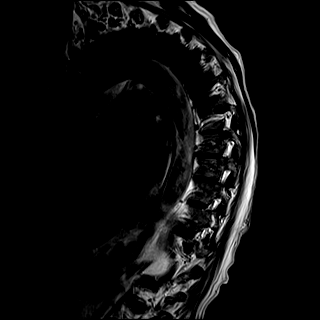
[im 8/20]
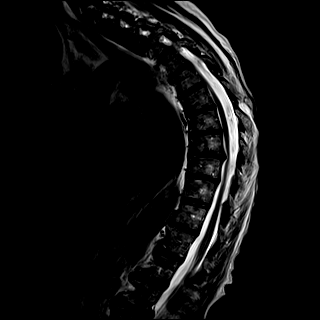
[im 12/20]
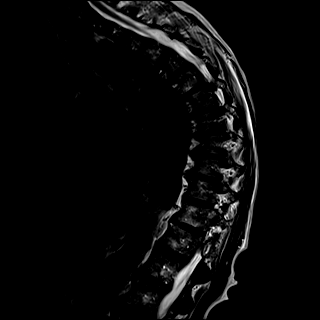
[im 16/20]
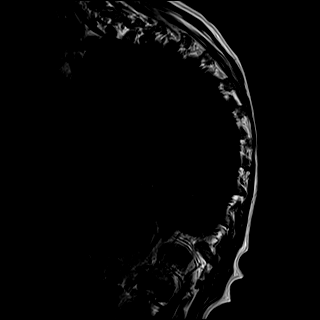
[im 20/20]
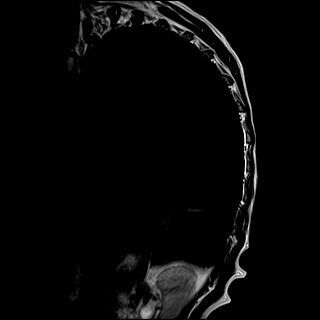

[Series 18: T1 · sagittal · 3.0mm · 0.94mm/px · 7 of 20 slices shown (2 of 2)]
[im 1/20]
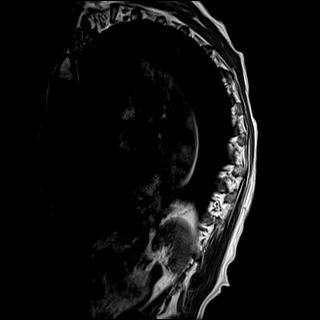
[im 4/20]
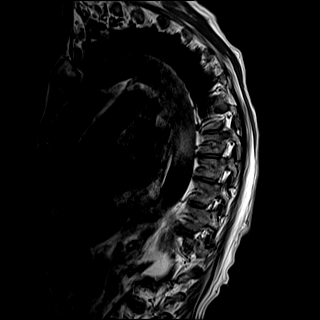
[im 7/20]
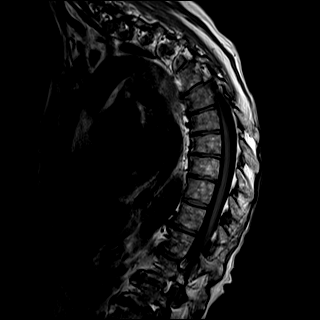
[im 10/20]
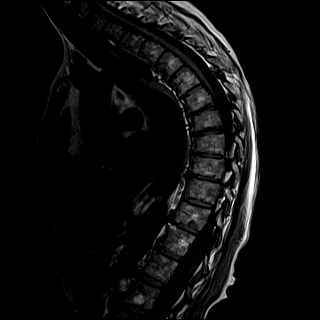
[im 13/20]
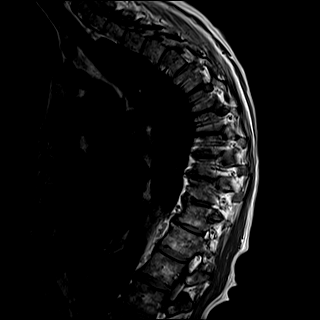
[im 16/20]
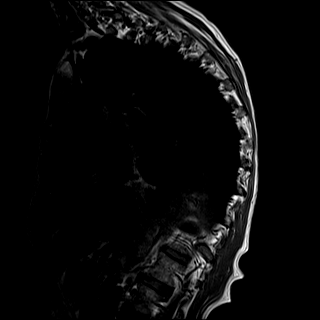
[im 20/20]
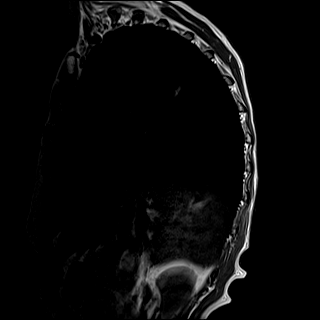

[Series 19: STIR · sagittal · 3.0mm · 0.47mm/px · 6 of 20 slices shown]
[im 1/20]
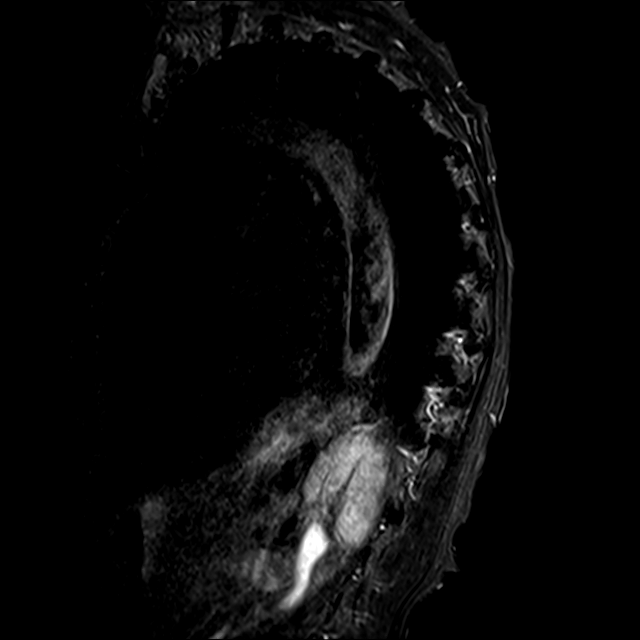
[im 4/20]
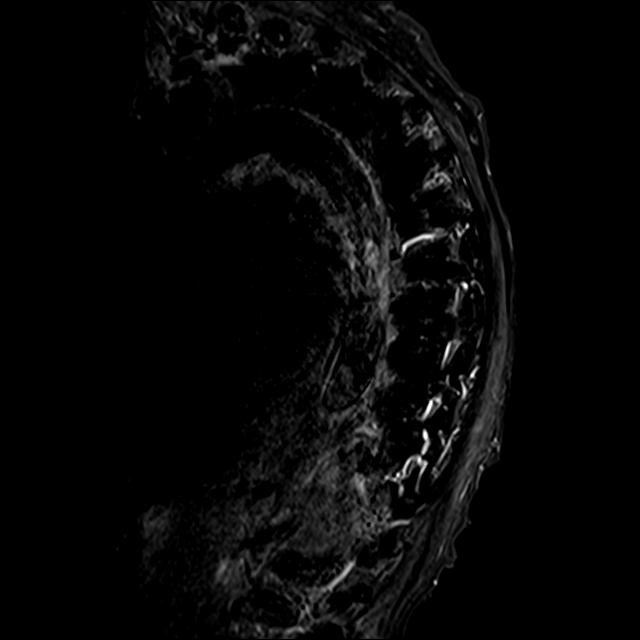
[im 7/20]
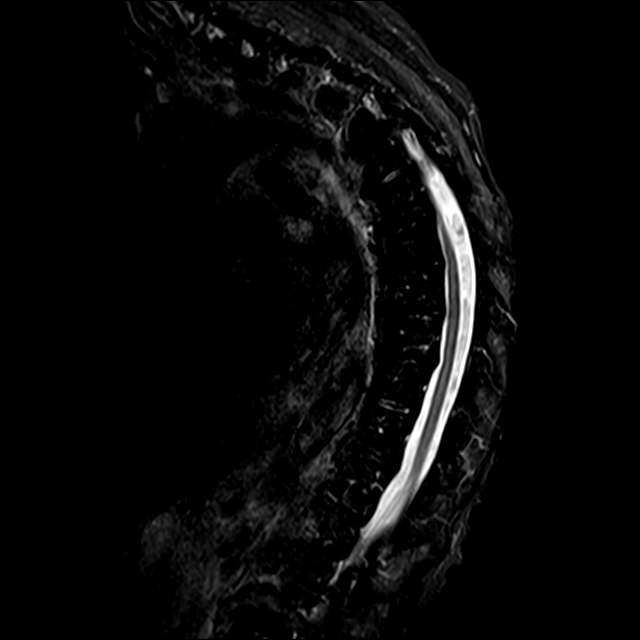
[im 10/20]
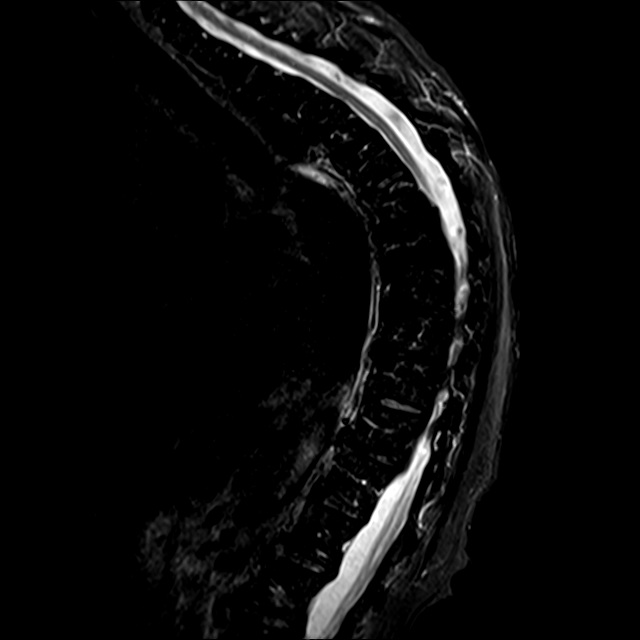
[im 13/20]
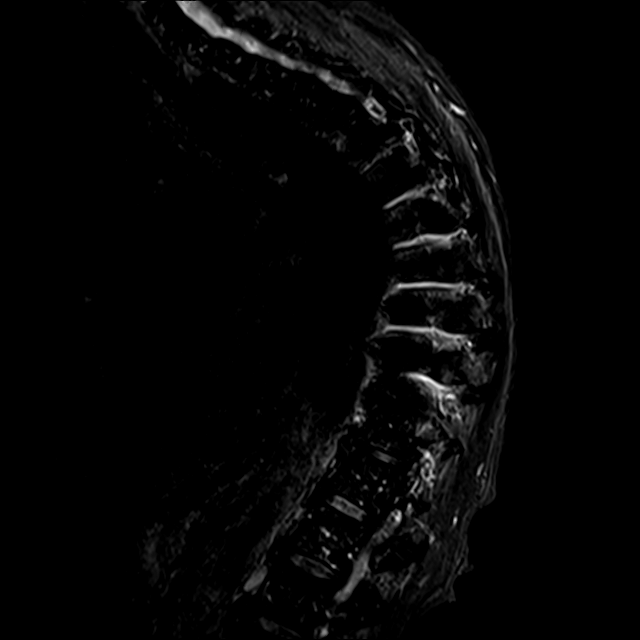
[im 16/20]
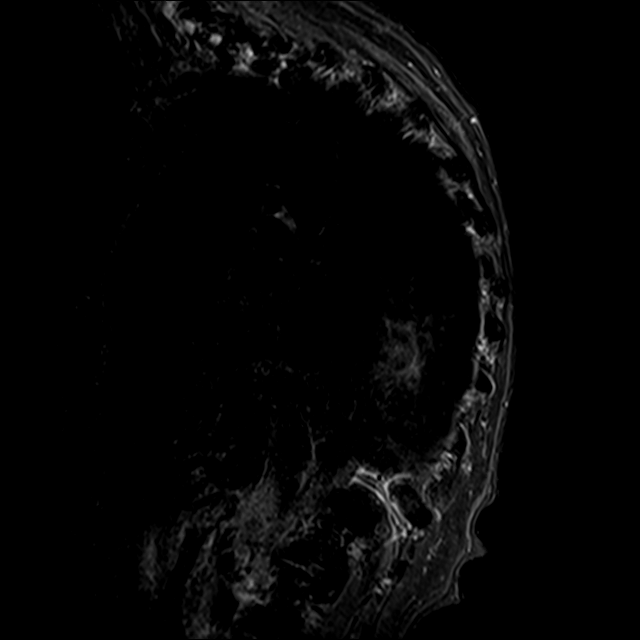

[Series 20: T2 · axial · 4.0mm · 0.59mm/px · z∈[-212,+54]mm · 9 of 39 slices shown (2 of 2)]
[im 1/39]
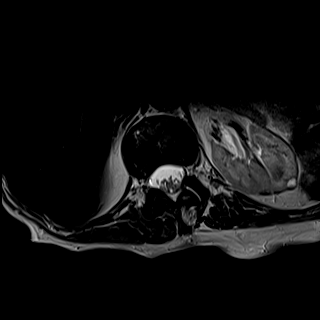
[im 7/39]
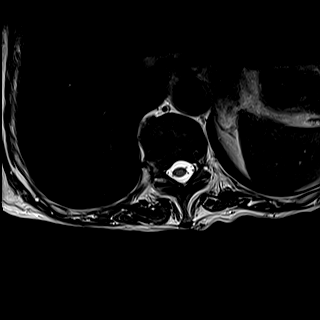
[im 13/39]
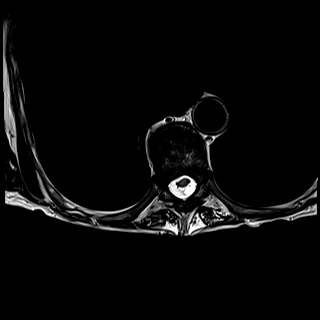
[im 16/39]
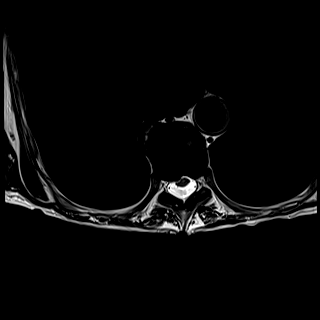
[im 20/39]
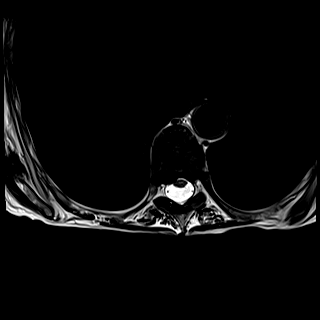
[im 23/39]
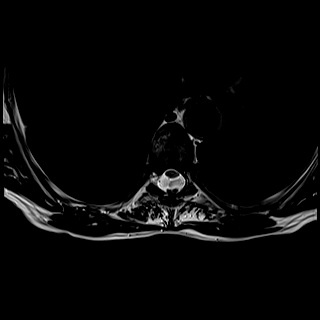
[im 26/39]
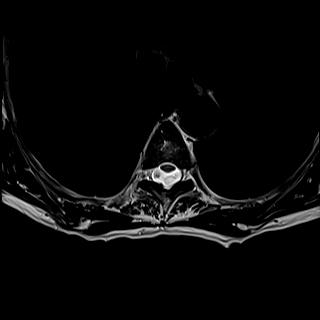
[im 32/39]
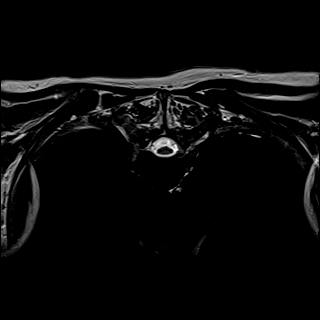
[im 39/39]
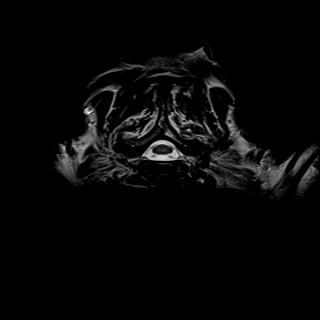

[30 of 48 positions shown; findings below may reference images not displayed]

FINDINGS: Alignment: Mild retrolisthesis T9-10 and T11-12 otherwise normal
alignment.

Vertebrae: Prominent dorsal kyphosis measuring approximately 60
degrees. Mild anterior wedging T6-T7-T8-T9. No acute fracture or
mass. Heterogeneous bone marrow with a benign pattern.

Cord:  Normal spinal cord signal.  Generous size spinal canal.

Paraspinal and other soft tissues: Negative

Disc levels:

Mild degenerative changes in the thoracic spine without stenosis

Small right-sided disc protrusion T7-8

Small right-sided disc protrusion T8-9 with cord flattening

Small right-sided disc protrusion T10-11
IMPRESSION: 60 degrees dorsal kyphosis.  No acute fracture.

Mild thoracic degenerative changes with small right-sided disc
protrusions at T7-8, T8-9, T10-11. No significant spinal stenosis.

## 2019-04-28 ENCOUNTER — Other Ambulatory Visit: Payer: Self-pay | Admitting: *Deleted

## 2019-04-28 NOTE — Patient Outreach (Signed)
[facility]) Care Management  04/28/2019  Katherine Baird [birth year] [id number]   CSW spoke with pt who reports she is doing well; "getting stronger but still can't walk far because of balance". Per pt, she does not feel she needs mental health support/counseling but is willing to give it a try. She is open to receiving a list of providers nearby and contacting to schedule appointment. CSW talked with pt about the benefit of therapy.   CSW will mail pt a list of in network providers in her area and call her again in 1-2 weeks.   Eduard Clos, MSW, [place] Worker  [place] [phone]

## 2019-04-29 ENCOUNTER — Encounter: Payer: Self-pay | Admitting: Dietician

## 2019-04-29 ENCOUNTER — Other Ambulatory Visit: Payer: Self-pay | Admitting: *Deleted

## 2019-04-29 NOTE — Patient Outreach (Signed)
[facility]) Care Management  04/29/2019  Katherine Baird 03/26/1951 [id number]   Telephone assessment  [facility] complex case management   PMHx noted :68 year old femalesignificant for Hypertension , Diastolic Heart failure,Takotsubo cardiomyopathy. 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  Subjective Incoming call from patient prior to scheduled call this morning.  Patient discussed yesterday was her last physical therapy visit , and that she would need new orders from MD to begin services again. Patient calling to make sure she did the right thing by calling MD office to resume orders for therapy, she reports that she was making good progress and did not want to lose her momentum reported being able to walk down her hallway with therapy and that is good she has come along way.  She discussed not always feeling comfortable with walking with some her personal care help due to age and other conditions , but she did do a little on today.  Reassured patient that calling MD office to send orders to [facility] to resume services was appropriate.  Reinforced with patient continuing to work on her home exercise plan that they have prescribed in the meantime , she reports continuing to work on hand and legs exercises . Discussed with patient home therapy may not be an ongoing service that can continue in the home,continually she verbalized understanding .  Nutrition She reports continuing to work on drinking at least one boost a day and eating small meals..  Skin concern Patient discussed recent cut to right arm has healed. She continues to change positions daily, to relieve pressure on sacral area and has continued healing no broken skin, Patient states she will have family  pick up Juven drink at PCP office.   Appointments Dr.Kernoldle on 9/8, family to provide transportation.   Plan  Will plan follow up care coordination  call in the next week regarding home health services being resumed.  Will continue education and support on chronic condition states.   Joylene Draft, RN, [place] Management Coordinator  [phone] Mobile [phone]- Toll Free Main Office

## 2019-04-29 NOTE — Progress Notes (Signed)
Patient cancelled her initial MNT appointment scheduled for 05/10/19. Sent letter to referring provider.

## 2019-05-06 ENCOUNTER — Encounter: Payer: Self-pay | Admitting: *Deleted

## 2019-05-06 ENCOUNTER — Other Ambulatory Visit: Payer: Self-pay | Admitting: *Deleted

## 2019-05-06 NOTE — Patient Outreach (Signed)
[place] [facility]) Care Management  [place]  05/06/2019   Katherine Baird [facility] [birth year] [id number]   Telephone assessment  THN complex case management   PMHx noted :68 year old femalesignificant for Hypertension , Diastolic Heart failure,Takotsubo cardiomyopathy. 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   Subjective:  Successful outreach call to patient ,she reports doing good. She discussed being able to walk a little bit but needs assistance because she is not steady. She discussed being afraid to walk with  some of her caregiver due to free of falling. She shared asking Dr.Kernodle at visit this week about resuming home health orders with another agency  and he was agreeable .She discussed not wanting to lose the momentum that she had gained after therapy completed, she states per [name] she had met her goals. She has occupational therapy visit on today for discharge.  She discussed continuing to work on exercise all throughout the day.   Patient discussed cancelling referral appointment to nutrition counseling, stating she has been there before and she is doing what they recommended , she also stated transportation was a problem, she didn't want to ask her family due to other upcoming appointments.   Encounter Medications:  Outpatient Encounter Medications as of 05/06/2019  Medication Sig  . acetaminophen (TYLENOL) 500 MG tablet Take 500 mg by mouth every 8 (eight) hours as needed for moderate pain.  Marland Kitchen aspirin EC 81 MG tablet Take 81 mg by mouth daily.  . carvedilol (COREG) 6.25 MG tablet Take 6.25 mg by mouth 2 (two) times daily with a meal.   . clobetasol ointment (TEMOVATE) 4.43 % Apply 1 application topically 2 (two) times  daily as needed (for irritation).   Marland Kitchen denosumab (PROLIA) 60 MG/ML SOSY injection Inject 60 mg into the skin every 6 (six) months.  . docusate sodium (COLACE) 100 MG capsule Take 100 mg by mouth daily as needed for mild constipation. Taking one to two a day  . Dupilumab 300 MG/2ML SOSY INJECT THE CONTENTS OF 1 SYRINGE (300MG) EVERY 2 WEEKS  . folic acid (FOLVITE) 1 MG tablet Take 1 mg by mouth daily.  Marland Kitchen gabapentin (NEURONTIN) 100 MG capsule Take 100 mg by mouth 4 (four) times daily.   Marland Kitchen levothyroxine (SYNTHROID, LEVOTHROID) 50 MCG tablet TAKE 1 TABLET BY MOUTH  DAILY  . mirtazapine (REMERON) 15 MG tablet Take 15 mg by mouth at bedtime.  . polyethylene glycol (MIRALAX / GLYCOLAX) packet Take 17 g by mouth daily as needed.   . SYRINGE/NEEDLE, DISP, 1 ML (BD INTEGRA SYRINGE) 25G X 5/8" 1 ML MISC 1 each by Does not apply route every 30 (thirty) days.  . traMADol (ULTRAM) 50 MG tablet Take 1 tablet (50 mg total) by mouth every 6 (six) hours as needed for moderate pain or severe pain. (Patient taking differently: Take 50 mg by mouth 3 (three) times daily. )   Facility-Administered Encounter Medications as of 05/06/2019  Medication  . cyanocobalamin ((VITAMIN B-12)) injection 1,000 mcg    Functional Status:  In your present state of health, do you have any difficulty performing the following activities: 05/06/2019 01/05/2019  Hearing? - N  Vision? N N  Difficulty concentrating or making decisions? [name]  Comment she keeps written notes and has caregivers -  Walking or climbing stairs? N Y  Comment  unable to climb steps, uses wheelchair, walker uses walker, needs assistance with steps   Dressing or bathing? [name]  Comment has caregiver support has caregiver that assist   Doing errands, shopping? Y Y  Comment Unable to family assist with transportation family assist   Preparing Food and eating ? [name]  Comment caregiver assist family assist   Using the Toilet? Y N  Comment caregiver assist -  In the  past six months, have you accidently leaked urine? N N  Do you have problems with loss of bowel control? N N  Managing your Medications? Y Y  Comment famiy and caregivers manage sister assist   Managing your Finances? Y Y  Comment family asssit family helps   Housekeeping or managing your Housekeeping? [name]  Comment caregiver assist family assist   Some recent data might be hidden    Fall/Depression Screening: Fall Risk  03/02/2019 01/06/2019 11/25/2018  Falls in the past year? '1 1 1  ' Comment - - late entry for 4/1  Number falls in past yr: '1 1 1  ' Injury with Fall? 1 0 1  Risk for fall due to : Impaired mobility;Impaired balance/gait;History of fall(s) History of fall(s);Impaired balance/gait;Impaired mobility Impaired mobility;Impaired balance/gait;History of fall(s)  Follow up Falls prevention discussed - Falls prevention discussed;Education provided   [name] 2/9 Scores 03/02/2019 01/06/2019 11/03/2018 09/22/2018 09/03/2018 07/15/2018 04/06/2018  PHQ - 2 Score 1 0 0 0 0 2 0  PHQ- 9 Score - - - - - 9 -    Assessment:   High fall risk, Chronic pain, osteoarthritis, lumbar radiculitis, decreased mobility, neuropathy.  Patient met goal of [facility] home health, services completed. She is interested in resuming home therapy not to lose her momentum. Patient continues to work on home exercises with legs , and hands. She does not feel safe to ambulate with some caregivers , but when available she reports being able to walk some  Patient to discuss with Dr.Shah at visit in next weeks regarding a referral to see if other treatments for neuropathy available.  Patient reports some improvement in pain after recent treatment at physical medicine and rehab. She requested referral to [name] home health for physical therapy during at office visit  with Dr.Kernodle on 9/8 . Final home health visit  with [name] is today. Discussed with patient home health therapy may not be an ongoing service she can receive in home, she  states being aware of that , discussed outpatient therapy she states that will not work for her. .Patient awaiting wheel chair to help with ease of transferring to commode.    Malnutrition  Patient continues to report improvement in intake eating, when she does not want to and trying to include boost on most days.  She cancelled appointment with nutritionist, she is unsure if she will reschedule , feels she understands what to eat she is working on doing it .Reinforced benefit of support and nutrition in maintaining strength with chronic conditions.  Reminded patient of transportation benefit with her insurance plan she is aware of that .     Care Coordination  Placed call to [facility] to follow up on previous order for drop arm wheelchair,Spoke with Angela Nevin at [facility] home health that was previously working on order for wheelchair to completing home health . She reports needing additional information from MD office on patient need for wheelchair she reports faxing need for information request to PCP office on 9/8 and will refax on today. Updated patient  on follow up .   Appointments  Dr. Manuella Ghazi, Neurology  9/21 Dr.Troxler, Podiatry  9/23 [name] PCP  9/29  Plan: Will plan follow up call within the next 2 weeks , to confirm  home health services in place  and wheelchair order status.    THN CM Care Plan Problem One     Most Recent Value  Care Plan Problem One  High risk for readmission related to malnutrition ,falls related to neuropathy   [care plan reinitiated. ]  Role Documenting the Problem One  Care Management [place] for Problem One  Active  THN Long Term Goal   Patient will not experience a fall over the next 90 days  [goal adjusted ]  THN Long Term Goal Start Date  02/17/19 [name] extended, recent ED visit concerns ]  Interventions for Problem One Long Term Goal  Discussed current clinical state, care coordination call to [facility] to follow up on wheelchair with drop  with help with transfers.   THN CM Short Term Goal #1   Patient will be able to report being active with home health over the next 14 days   THN CM Short Term Goal #1 Start Date  03/02/19 [name] restarted ]  [facility] CM Short Term Goal #1 Met Date  03/11/19  [facility] CM Short Term Goal #2   Patient will be able to report including nutrition supplement drink at least once  daily over the next 30 days   THN CM Short Term Goal #2 Start Date  04/13/19 [facility] reset ]  Interventions for Short Term Goal #2  Reviewd current progress with adding supplment to daily plan , encouraged to continue to work toward including it in daily plan .   THN CM Short Term Goal #3  Over the next 30 days patient will be able to report maintaing weight with at least 2 pound weigh gain .   THN CM Short Term Goal #3 Start Date  04/29/19  Interventions for Short Tern Goal #3  Reinforced attending appointment with nutritionist , reviewed benefits, discussed barrier of transportation .   THN CM Short Term Goal #4  Patient will be able to report continued improvement with mobility over the next 30 days   THN CM Short Term Goal #4 Start Date  04/29/19  Interventions for Short Term Goal #4  Encouraged to continue exercise plan prescribed by therapist and as safe walk with assist of caregivers     [facility] CM Care Plan Problem Two     Most Recent Value  Care Plan Problem Two  Knowledge defict related to self care managment of pressure wound and preventions  Role Documenting the Problem Two  Care Management [place] for Problem Two  Active  THN Long Term Goal  Over the next 45 days patient will be able to report wound healing   THN Long Term Goal Start Date  03/08/19  [facility] Long Term Goal Met Date  04/22/19  [facility] CM Short Term Goal #1   Patient will be able to reports attending all medical appointments over the next 14 days.   THN CM Short Term Goal #1 Start Date  03/08/19  THN CM Short Term Goal #1 Met Date   03/25/19  THN CM Short  Term Goal #2   Over the next 30 days patient will be able to demonstrate 2 measures toward  pressure wound prevention and healing .   THN CM Short Term Goal #2 Start  Date  03/08/19  THN CM Short Term Goal #2 Met Date  04/13/19    [facility] CM Care Plan Problem Three     Most Recent Value  Care Plan Problem Three  Self care knowledge related to chronic conditions of Takotsubo cardiomyopathy, diastolic heart failure managment   Role Documenting the Problem Three  Care Management Coordinator  Care Plan for Problem Three  Active  THN Long Term Goal   Over the next 80 days patient will be able to demonstate 2 measures of self care management of chronic cardiac conditions  [goal date adjusted ]  THN Long Term Goal Start Date  03/11/19  Interventions for Problem Three Long Term Goal  Reviewed current status on continuing to monitor vitals signs, and symptom of chest pain, shortness of breath.   THN CM Short Term Goal #1   Over the next 30 days patient will be able to identify worsening symptoms of heart failure and action plan    THN CM Short Term Goal #1 Start Date  03/11/19  [facility] CM Short Term Goal #1 Met Date  04/13/19  THN CM Short Term Goal #2   Patient will be able to monitor blood pressure at least once weekly over the next 30 days   THN CM Short Term Goal #2 Start Date  03/11/19  [facility] CM Short Term Goal #2 Met Date  04/13/19      Joylene Draft, RN, [place] Management Coordinator  [phone]- Mobile [phone]- [place]

## 2019-05-10 ENCOUNTER — Ambulatory Visit: Payer: Self-pay | Admitting: *Deleted

## 2019-05-10 ENCOUNTER — Ambulatory Visit: Payer: Medicare Other | Admitting: Dietician

## 2019-05-11 ENCOUNTER — Other Ambulatory Visit: Payer: Self-pay | Admitting: *Deleted

## 2019-05-11 NOTE — Patient Outreach (Signed)
[place] [facility]) Care Management  05/11/2019  Katherine Baird 12/05/1950 [id number]  CSW was unable to reach pt by phone on 05/10/2019. CSW left a HIPPA compliant message and will try again per protocol for 3 outreach attempts in 10 business days.  CSW  Eduard Clos, MSW, [place] Worker  [place] [phone]

## 2019-05-14 ENCOUNTER — Other Ambulatory Visit: Payer: Self-pay | Admitting: *Deleted

## 2019-05-14 NOTE — Patient Outreach (Signed)
[place] [facility]) Care Management  05/14/2019  Darcell Yacoub 1951/08/05 [id number]  CSW spoke with pt who reports she is doing good. She has been able to walk more and is feeling stronger and more confident but does feel HH support would be beneficial. "I want to get a few more weeks of physical therapy if I can".  CSW encouraged pt to call her PCP and CSW will also send a message to them for possible HH orders to be submitted.  Pt denies any mental health concerns.   CSW will plan a follow up call to pt in 7-10 days. Eduard Clos, MSW, [place] Worker  [place] [phone]

## 2019-05-18 ENCOUNTER — Other Ambulatory Visit: Payer: Self-pay | Admitting: *Deleted

## 2019-05-18 NOTE — Patient Outreach (Signed)
[place] [facility]) Care Management  05/18/2019  Katherine Baird 26-May-1951 [id number]    Telephone assessment  THN complex case management  PMHx noted :68 year old femalesignificant for Hypertension , Diastolic Heart failure,Takotsubo cardiomyopathy. 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   Subjective:  Unsuccessful outreach call to patient , no answer able to leave a HIPAA compliant message for return call .     Plan If no return call from patient today, will schedule return call to patient in the next 4 days per workflow.    Joylene Draft, RN, [place] Management Coordinator  [phone]- Mobile [phone]- Toll Free Main Office

## 2019-05-21 ENCOUNTER — Other Ambulatory Visit: Payer: Self-pay | Admitting: *Deleted

## 2019-05-21 NOTE — Patient Outreach (Signed)
[place] [facility]) Care Management  [facility] Care Manager  05/21/2019   Katherine Baird 1950/09/27 [id number]    Telephone assessment   THN complex case management  PMHx noted :68 year old femalesignificant for Hypertension , Diastolic Heart failure,Takotsubo cardiomyopathy. 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  Subjective:  Patient reports that she is doing good, she reports being able to do some walking in home with caregiver, stating walking from living room to bedroom .  Patient discussed visit with Neurology on this week and requested home health services instead of outpatient therapy.   Patient discussed continuing to focus  on her nutrition, and working on drinking boost one a day.     Objective: Wt 78 lb (35.4 kg)   BMI 14.27 kg/m   Encounter Medications:  Outpatient Encounter Medications as of 05/21/2019  Medication Sig  . acetaminophen (TYLENOL) 500 MG tablet Take 500 mg by mouth every 8 (eight) hours as needed for moderate pain.  Marland Kitchen aspirin EC 81 MG tablet Take 81 mg by mouth daily.  . carvedilol (COREG) 6.25 MG tablet Take 6.25 mg by mouth 2 (two) times daily with a meal.   . clobetasol ointment (TEMOVATE) 4.85 % Apply 1 application topically 2 (two) times daily as needed (for irritation).   Marland Kitchen denosumab (PROLIA) 60 MG/ML SOSY injection Inject 60 mg into the skin every 6 (six) months.  . docusate sodium (COLACE) 100 MG capsule Take 100 mg by mouth daily as needed for mild constipation. Taking one to two a day  . Dupilumab 300 MG/2ML SOSY INJECT THE CONTENTS OF 1 SYRINGE (300MG) EVERY 2 WEEKS  . folic acid (FOLVITE) 1 MG tablet Take 1 mg by mouth daily.  Marland Kitchen gabapentin (NEURONTIN) 100 MG capsule Take 100 mg by mouth 4  (four) times daily.   Marland Kitchen levothyroxine (SYNTHROID, LEVOTHROID) 50 MCG tablet TAKE 1 TABLET BY MOUTH  DAILY  . mirtazapine (REMERON) 15 MG tablet Take 15 mg by mouth at bedtime.  . polyethylene glycol (MIRALAX / GLYCOLAX) packet Take 17 g by mouth daily as needed.   . SYRINGE/NEEDLE, DISP, 1 ML (BD INTEGRA SYRINGE) 25G X 5/8" 1 ML MISC 1 each by Does not apply route every 30 (thirty) days.  . traMADol (ULTRAM) 50 MG tablet Take 1 tablet (50 mg total) by mouth every 6 (six) hours as needed for moderate pain or severe pain. (Patient taking differently: Take 50 mg by mouth 3 (three) times daily. )   Facility-Administered Encounter Medications as of 05/21/2019  Medication  . cyanocobalamin ((VITAMIN B-12)) injection 1,000 mcg    Functional Status:  In your present state of health, do you have any difficulty performing the following activities: 05/06/2019 01/05/2019  Hearing? - N  Vision? N N  Difficulty concentrating or making decisions? [name]  Comment she keeps written notes and has caregivers -  Walking or climbing stairs? N Y  Comment unable to climb steps, uses wheelchair, walker uses walker, needs assistance with steps   Dressing or bathing? [name]  Comment has caregiver support has caregiver that assist   Doing errands, shopping? Y Y  Comment Unable to family assist with transportation family assist   Preparing Food and eating ? [name]  Comment caregiver assist family assist   Using the Toilet? Y N  Comment caregiver assist -  In the past six months, have you accidently leaked urine? N N  Do you have problems with loss of bowel control? N N  Managing your Medications? Y Y  Comment famiy and caregivers manage sister assist   Managing your Finances? Y Y  Comment family asssit family helps   Housekeeping or managing your Housekeeping? [name]  Comment caregiver assist family assist   Some recent data might be hidden    Fall/Depression Screening: Fall Risk  03/02/2019 01/06/2019 11/25/2018  Falls in  the past year? _0 Comment - - late entry for 4/1  Number falls in past yr: _1 Injury with Fall? 1 0 1  Risk for fall due to : Impaired mobility;Impaired balance/gait;History of fall(s) History of fall(s);Impaired balance/gait;Impaired mobility Impaired mobility;Impaired balance/gait;History of fall(s)  Follow up Falls prevention discussed - Falls prevention discussed;Education provided   [facility] 2/9 Scores 03/02/2019 01/06/2019 11/03/2018 09/22/2018 09/03/2018 07/15/2018 04/06/2018  PHQ - 2 Score 1 0 0 0 0 2 0  PHQ- 9 Score - - - - - 9 -    Assessment:   High Fall risk/Chronic pain, lumbar radiculitis, neuropathy. No recent falls, she has made some progress with walking in home with caregivers, but wants additional time with home health physical therapy services. She discussed unable to do outpatient therapy, due to transportation concerns . I have reviewed transportation benefits of her insurance plan that she is aware of. Dr.Shah office has office has placed referral to [facility] home health. Discussed with patient to mention at PCP visit in next week.  Pain has begin new regimen of amitriptyline after recent neurology visit.  Patient has declined for now pursuing further referral at [facility] due to neurology.   Severe Malnutrition Patient continues to work toward increasing calories, intake and drink boost daily . Reports weigh this week 78 no weight loss .  She has declined attending , Nutrition class.stating she has attended once and has good understanding of what to do. Reinforced benefit of additional support to help improve condition and met her goals  Takotsubo Cardiomyopathy Patient continues to have blood pressure measured daily and sent to telemonitoring through insurance plan. She is also weighing about once a week. She denies chest pain or shortness of breath.    Care Coordination  Placed call to [place] office to follow up on home health services able to leave a message, placed call to  [facility] physical therapy to inquire if referral had been received able to leave a message for return call.   Placed call to [facility] with [facility] home health that was previously working on patient request for wheel chair with drop arm.She states that she is waiting on additional information from PCP office to include office visit note , supporting why patient will need dropped arm wheelchair.  Placed call to [place] office spoke with representative , CMA to communicate information and patient has planned office visit on 9/29 and will discuss ongoing home health therapy.   Patient denies any other concerns at this time, continue to assess for additional resource needs for follow up on complex care management plan.   Plan:  Will plan return call to patient in the next 7- 10 business days.  Will send PCP this visit note.   THN CM Care Plan Problem One     Most Recent Value  Care Plan Problem One  High risk for readmission related to malnutrition ,falls related to neuropathy   [care plan reinitiated. ]  Role Documenting the Problem One  Care Management Coordinator  Care Plan for Problem One  Active  THN Long Term Goal   Patient will not experience a fall over the next 90 days  [goal adjusted ]  THN Long Term Goal Start Date  02/17/19 [name] extended, recent ED visit concerns ]  Interventions for Problem One Long Term Goal  Again reviewed patient overall progress and praised her continued efforts of continuing with exercise plan dailly and continued to walk as tolerated with help at home.   THN CM Short Term Goal #1   Patient will be able to report being active with home health over the next 14 days   THN CM Short Term Goal #1 Start Date  03/02/19 [name] restarted ]  [facility] CM Short Term Goal #1 Met Date  03/11/19  [facility] CM Short Term Goal #2   Patient will be able to report including nutrition supplement drink at least once  daily over the next 30 days   THN CM Short Term Goal #2 Start Date  05/21/19 [facility]  reset ]  Interventions for Short Term Goal #2  Discussed importance of nutrition support, that adequate protein provides, to progress in strenght and balance   THN CM Short Term Goal #3  Over the next 30 days patient will be able to report maintaing weight with at least 2 pound weigh gain .  [goal adjusted ]  THN CM Short Term Goal #3 Start Date  04/29/19  Interventions for Short Tern Goal #3  Discussed current weight and support for ongoing goal   THN CM Short Term Goal #4  Patient will be able to report continued improvement with mobility over the next 30 days   THN CM Short Term Goal #4 Start Date  04/29/19  Interventions for Short Term Goal #4  Encouraged to continue exercise plan prescribed by therapist and as safe walk with assist of caregivers     [facility] CM Care Plan Problem Two     Most Recent Value  Care Plan Problem Two  Knowledge defict related to self care managment of pressure wound and preventions  Role Documenting the Problem Two  Care Management [place] for Problem Two  Active  THN Long Term Goal  Over the next 45 days patient will be able to report wound healing   THN Long Term Goal Start Date  03/08/19  [facility] Long Term Goal Met Date  04/22/19  [facility] CM Short Term Goal #1   Patient will be able to reports attending all medical appointments over the next 14 days.   THN CM Short Term Goal #1 Start Date  03/08/19  THN CM Short Term Goal #1 Met Date   03/25/19  THN CM Short Term Goal #2   Over the next 30 days patient will be able to demonstrate 2 measures toward  pressure wound prevention and healing .   THN CM Short Term Goal #2 Start Date  03/08/19  THN CM Short Term Goal #2 Met Date  04/13/19    [facility] CM Care Plan Problem Three     Most Recent Value  Care Plan Problem Three  Self care knowledge related to chronic conditions of Takotsubo cardiomyopathy, diastolic heart failure managment   Role Documenting the Problem Three  Care Management Coordinator  Care Plan for  Problem Three  Active  THN Long Term Goal   Over the next 80 days patient will be able to demonstate 2 measures of  self care management of chronic cardiac conditions  [goal date adjusted ]  THN Long Term Goal Start Date  03/11/19  Interventions for Problem Three Long Term Goal  Discussed current clinical state for cardiac symptoms, praised patient of ongoing montitor of vitals and progress with monitoring weights.   THN CM Short Term Goal #1   Over the next 30 days patient will be able to identify worsening symptoms of heart failure and action plan    THN CM Short Term Goal #1 Start Date  03/11/19  [facility] CM Short Term Goal #1 Met Date  04/13/19  THN CM Short Term Goal #2   Patient will be able to monitor blood pressure at least once weekly over the next 30 days   THN CM Short Term Goal #2 Start Date  03/11/19  [facility] CM Short Term Goal #2 Met Date  04/13/19      Joylene Draft, RN, [place] Management Coordinator  [phone]- Mobile [phone]- [place]

## 2019-05-28 ENCOUNTER — Other Ambulatory Visit: Payer: Self-pay | Admitting: *Deleted

## 2019-05-28 NOTE — Patient Outreach (Signed)
[place] [facility]) Care Management  05/28/2019  Katherine Baird August 11, 1951 [id number]   CSW spoke with pt who is still awaiting [place] to resume. She reports 2 providers have placed the orders and she has not heard from anyone. CSW encouraged her to call the provider(s) and ask them to resubmit the [facility] orders.  She is unsure which [place] agency; maybe [facility].  CSW will plan a follow up call next week to pt for updates. She denies any other concerns or needs at this time.   Eduard Clos, MSW, [place] Worker  [place] [phone]

## 2019-05-31 ENCOUNTER — Other Ambulatory Visit: Payer: Self-pay | Admitting: *Deleted

## 2019-05-31 ENCOUNTER — Ambulatory Visit: Payer: Self-pay | Admitting: *Deleted

## 2019-05-31 NOTE — Patient Outreach (Signed)
[facility]) Care Management  05/31/2019  Armilda Vanderlinden [birth year] [id number]   CSW attempted to reach pt today by phone and was unsuccessful.  CSW left a HIPPA compliant voice message and will try again in 3-4 business days if no return call is received.   Eduard Clos, MSW, [place] Worker  [place] [phone]

## 2019-06-01 ENCOUNTER — Other Ambulatory Visit: Payer: Self-pay | Admitting: *Deleted

## 2019-06-01 NOTE — Patient Outreach (Signed)
[facility]) Care Management  06/01/2019  Hermenia Fritcher [birth year] [id number]   Telephone follow up call  [facility] complex case management  PMHx noted :68 year old femalesignificant for Hypertension , Diastolic Heart failure,Takotsubo cardiomyopathy. 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   Subjective  Patient reports that she is doing good, discussed recent PCP visit and MD being very pleased with patient progress with weight gain , reports being up to 88 lbs reports her brother being very please also.  Patient further discussed :  Mobility /High fall risk  She voiced continuing to make progress with mobility, walking length of hallway in home with walker and personal care assistance. She persist in wanting additional home health therapy to continue with progress. No recent falls.  Reports speaking with PCP at visit on last week about home health physical therapy request.  Patient discussed still awaiting wheelchair with drop arm as previously recommended by home health therapy she reports not hearing back on getting wheelchair.  Malnutrition, weight loss Improved, with reported weight gain she continues to work on drinking boost one daily, taking in extra calories, eating better.  Takotsubo Cardiomyopathy,  heart failure/Hypertension . Patient denies shortness of breath, increase in swelling or chest pain. She monitors weight at home with telemonitoring system. Reports blood pressure reading this am 93/65 has this is in her usual range she denies having dizziness, reports her caregivers/and her sister helps with medication management.  Discussed importance of Flu shot for this season patient reports that she hasn't taken the flu  shot in several years  due to weak immune system. She states that she staying safe and wearing her mask when she goes out to the doctor.   Care Coordination  Placed call to PCP office to follow up on patient request for home physical therapy , and follow up on prior order for wheelchair with  drop arm .Spoke with Maryella Shivers CMA regarding patient prior request for home health physical therapy she discussed with PCP at office visit , I also discussed patient concern regarding request for wheelchair with drop arm she has not received communication from DME agency through [facility] , that originally requested wheelchair. I discussed attempted contact with Angela Nevin at [facility] able to leave a voicemail message for return call. Patient requesting orders be sent to local DME company in [place] now. Provided my contact number for is needed for follow up.   Discussed with patient [facility] health coach program for ongoing education and support, reviewed care coordination follow up regarding home health and drop arm wheelchair to be addressed prior to transition to program.   Plan Will plan follow up call in the next week to assure care coordination needs met prior to transition to disease management program .  [facility] CM Care Plan Problem One     Most Recent Value  Care Plan Problem One  High risk for readmission related to malnutrition ,falls related to neuropathy    Role Documenting the Problem One  Care Management [place] for Problem One  Active  [facility] Long Term Goal   Patient will not experience a fall over the next 90 days   THN Long Term Goal Start Date  02/17/19  [facility] Long Term Goal Met Date  06/01/19  [facility] CM Short Term Goal #1   Patient will  be able to report being active with home health over the next 14 days   THN CM Short Term Goal #1 Start Date  03/02/19  [facility] CM Short Term Goal #1 Met Date  03/11/19  [facility] CM Short Term Goal #2   Patient will be able to report including nutrition supplement drink  at least once  daily over the next 30 days   THN CM Short Term Goal #2 Start Date  05/21/19  THN CM Short Term Goal #3  Over the next 30 days patient will be able to report maintaing weight with at least 2 pound weigh gain .   THN CM Short Term Goal #3 Start Date  04/29/19  THN CM Short Term Goal #3 Met Date  06/01/19  THN CM Short Term Goal #4  Patient will be able to report continued improvement with mobility over the next 30 days   THN CM Short Term Goal #4 Start Date  04/29/19  [facility] CM Short Term Goal #4 Met Date  06/01/19    [facility] CM Care Plan Problem Three     Most Recent Value  Care Plan Problem Three  Self care knowledge related to chronic conditions of Takotsubo cardiomyopathy, diastolic heart failure managment   Role Documenting the Problem Three  Care Management Coordinator  Care Plan for Problem Three  Active  THN Long Term Goal   Over the next 90 days patient will be able to demonstate 2 measures of self care management of chronic cardiac conditions  [goal adjustment ]  THN Long Term Goal Start Date  03/11/19  Interventions for Problem Three Long Term Goal  Reviewed current clinical state and assessemnt of worsening symptoms to notify MD of with teachback, shortness of breath , sudden weight gain of 3 pounds in a day and 5 in a week, increased swelling   THN CM Short Term Goal #1   Over the next 30 days patient will be able to identify worsening symptoms of heart failure and action plan    THN CM Short Term Goal #1 Start Date  03/11/19  [facility] CM Short Term Goal #1 Met Date  04/13/19  THN CM Short Term Goal #2   Patient will be able to monitor blood pressure at least once weekly over the next 30 days   THN CM Short Term Goal #2 Start Date  03/11/19  [facility] CM Short Term Goal #2 Met Date  04/13/19      Joylene Draft, RN, [place] Management Coordinator  [phone]- Mobile [phone]- [place]

## 2019-06-04 ENCOUNTER — Other Ambulatory Visit: Payer: Self-pay | Admitting: *Deleted

## 2019-06-04 NOTE — Patient Outreach (Signed)
[place] [facility]) Care Management  06/04/2019  Katherine Baird 1951-06-07 [id number]   CSW spoke with pt who reports she is doing well.  CSW offered and discussed with pt about her mental health needs; she states she does not feel she needs counseling.  She also reports she has called her PCP office about [place] but has not been contacted to resume services. CSW will message PCP and [facility] RNCM regarding this need.  Pt agreeable to CSW case closure at this time. If needs arise or pt decides she does want to pursue therapy, please re-consult [facility] CSW.   Eduard Clos, MSW, [place] Worker  [place] [phone]

## 2019-06-08 ENCOUNTER — Other Ambulatory Visit: Payer: Self-pay | Admitting: *Deleted

## 2019-06-08 ENCOUNTER — Encounter: Payer: Self-pay | Admitting: *Deleted

## 2019-06-08 NOTE — Patient Outreach (Signed)
[facility]) Care Management  06/08/2019  Prosperity Darrough [birth year] [id number]   Telephone assessment  Care Coordination    [facility] complex case management  PMHx noted :68 year old femalesignificant for Hypertension , Diastolic Heart failure,Takotsubo cardiomyopathy. 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  Patient again discussed that she is doing some walking in the home, when she support from certain caregivers present, she reports that she still has fear of falling . Patient states that she has not heard from home health agency regarding resuming home physical therapy.  Discussed with patient her recent completion and goals met with home health therapy. She discussed planning to call her insurance company , stating understanding that they may not have approved it as she  just recently completed home therapy.  Patient then discussed that she had received information on requested wheelchair with drop arm, she expressed frustration as it has been a while . Patient denies having recent fall.   She further discussed :  Takotsubo Cardiomyopathy Patient with the help of personal care providers continues to monitor blood pressures daily. Caregivers help with daily medication management. Patient weighs about once a week , reports due to difficulty as times balancing on scales. She denies shortness of breath , chest pain, increase in swelling in lower, she reports no increase in usual swelling in lower legs she is able to identify these symptoms as worsening of heart failure .   Patient is enrolled in [employer] Heart Failure Program.   Weight loss, Malnutrition  Patient is pleased with weight gain, continues to make effort to increase nutrition in foods eaten, focusing on including protein in diet, including boost daily.    Care Coordination  Will place call to PCP office to follow up on Home health orders and drop arm wheelchairs orders being sent to DME company. Spoke with Latoya at office regarding above patient request.   Plan Will plan follow up call to patient in the next week and possible case closure, transition to health coach for continued disease management .    [facility] CM Care Plan Problem One     Most Recent Value  Care Plan Problem One  High Risk for falls related to mobility , neuropathy chronic condition   Role Documenting the Problem One  Care Management [place] for Problem One  Active  THN Long Term Goal   Over the next 30 days patient will report improved mobility   THN Long Term Goal Start Date  06/08/19  Interventions for Problem One Long Term Goal  Discussed current mobility progress and support in the home.   [facility] CM Short Term Goal #1   Over the next 30 days will be able to report continuing in home exerise plan as prescribed by home health physical therapy   [facility] CM Short Term Goal #1 Start Date  06/08/19  Interventions for Short Term Goal #1  Advised patient in benefits of continuing home therapy as recommended by therapy to help with strenght , balance and maintaing goals already acheived.     [facility] CM Care Plan Problem Three     Most Recent Value  Care  Plan Problem Three  Self care knowledge related to chronic conditions of Takotsubo cardiomyopathy, diastolic heart failure managment   Role Documenting the Problem Three  Care Management Coordinator  Care Plan for Problem Three  Active  THN Long Term Goal   Over the next 90 days patient will be able to demonstate 2 measures of self care management of chronic cardiac  conditions   THN Long Term Goal Start Date  03/11/19  Interventions for Problem Three Long Term Goal  Again reviewed patient current clinical status on signs/symptoms of heart failure . and importance of notifying MD of concerns sooner , to avoid hospital admission   [facility] CM Short Term Goal #1   Over the next 30 days patient will be able to identify worsening symptoms of heart failure and action plan    [facility] CM Short Term Goal #1 Start Date  03/11/19  [facility] CM Short Term Goal #1 Met Date  04/13/19  [facility] CM Short Term Goal #2   Patient will be able to monitor blood pressure at least once weekly over the next 30 days   [facility] CM Short Term Goal #2 Start Date  03/11/19  [facility] CM Short Term Goal #2 Met Date  04/13/19  [facility] CM Short Term Goal #3   Over the next 30 days patient will be able to report checking weight at least once daily   THN  CM Short Term Goal #3 Start Date  06/08/19  Interventions for Short Term Goal #3  Discussed with patient benefit of monitoring weight at home to help identify sudden changes in weight as sign of worsening of heart condition, sudden weight gain of 5 pounds in a week, swelling in lower legs.       Joylene Draft, RN, [place] Management Coordinator  [phone]- Mobile [phone]- Toll Free Main Office

## 2019-06-22 ENCOUNTER — Other Ambulatory Visit: Payer: Self-pay | Admitting: *Deleted

## 2019-06-22 NOTE — Patient Outreach (Signed)
[facility]) Care Management  06/22/2019  Katherine Baird 06/11/1951 [id number]   Telephone assessment   [facility] complex case management  PMHx noted :68 year old femalesignificant for Hypertension , Diastolic Heart failure,Takotsubo cardiomyopathy. chronicback pain,iron deficiency , osteoporosis, B12 deficiency, Peripheral neuropathy, Cervical and lumbar disc disease thoracic scoliosis with chronic pain,severe malnutrition, bilateral neuropathic pain of leg and hands., Frequent falls, DDD lumbar,severe protein malnutrition   Subjective Unsuccessful outreach call to patient, no answer able to leave a HIPAA compliant message for return call.    Plan Will plan return call in the next 4 business days if no return call from patient on today.    Katherine Draft, RN, [place] Management Coordinator  [phone]- Mobile [phone]- Toll Free Main Office

## 2019-06-25 ENCOUNTER — Other Ambulatory Visit: Payer: Self-pay | Admitting: *Deleted

## 2019-06-25 NOTE — Patient Outreach (Signed)
[facility]) Care Management  06/25/2019  Bobetta Korf 02-Aug-1951 [id number]   Telephone assessment  Case Closure   [facility] complex case management  PMHx noted :68 year old femalesignificant for Hypertension , Diastolic Heart failure,Takotsubo cardiomyopathy. chronicback pain,iron deficiency , osteoporosis, B12 deficiency, Peripheral neuropathy, Cervical and lumbar disc disease thoracic scoliosis with chronic pain,severe malnutrition, bilateral neuropathic pain of leg and hands., Frequent falls, DDD lumbar,severe protein malnutrition    Subjective: Successful outreach call to patient, she reports that she is doing much better.  She further discussed :  -Mobility Concerns  Patient reports that she continues to get stronger in her legs, she can stand better and some improvement in balance. Patient reports that she continues to work on daily exercises, and walk in home with walker and assistance with caregiver . Patient discussed now being able to walk to commode using rolling walker and get to commode with assistance. She discussed that she has moved past the need of wheelchair with drop arm . She reports that she doesn't feel that she needs home health therapy at this time due to she is doing more at home and working on the exercises that previous home health physical therapy has  taught her.  Patient denies having recurrent fall.   Malnutrition  Patient reports improved appetite and eating 2 to 3 meals a day along with daily use of nutrition drink.  She reports recent weight of 88 lbs, no weight loss.  Reinforced continued nutrition support reviewed correlation in proper nutrition benefits with  chronic conditions  Patient reports healed areas of prior concern on her left heel and sacral area, she reports changes her positions frequently when sitting, wearing prevalon boot at night to prevent pressure on heels.   Chronic Pain /neuropathy  Patient discussed pain  in back and lower legs is worse in the morning , and at times needing assistance with getting legs out of bed.  Patient discussed recently notifying Neurology MD as recommended for follow up on improvement for dose change. She reports some improvement and notified MD office and Amitriptyline dose will be increased , she is awaiting new prescription filled family will pick it up for her.   Takotsubo Cardiomyopathy/Hypertension  Patient denies chest pain or discomfort, denies shortness of breath  reports usual swelling in her feet.  Patient reports that she continues to monitor daily blood pressures , she reports that they are always good, reports that she hasn't checked blood pressure today but last reading around 100/. Patient able to teachback worsening symptoms to seek medical attention for , shortness of breath , chest discomfort, increased swelling or sudden with gain of 5 pounds in a week.  Patient discussed having scales through her insurance company and nurse checks on her by phone.   Social Patient continues to have support by 24 hours hired caregivers She discussed that this is working out well. She discussed that her family provides transportation to medical appointments and communicates with her by phone.   Care Coordination  Late Entry from 10/27 Outreach call to patient [facility] care manager , Noreene Larsson she discussed her involvement with patient for Heart failure management program. Pam discussed recent outreaches to patient for follow up , on heart failure management .   Discussed with patient [facility] care management involvement with patient and [facility] care management services is duplication of services. Discussed with patient [facility] care management with close services and she will be followed by [place] management in heart failure  program. Patient voiced understanding of and shared thanks for assistance during this year.  Verified patient has my contact number in case of  need for [facility] care management  services.    Plan Will send PCP Case Closure letter: Patient to be in external case management program.  Reinforced with patient notifying MD sooner new or worsening symptoms Encouraged to keep all upcoming medical appointments.    [name], RN, [facility] Care Management,Care Management Coordinator  [phone]- Mobile [phone]- Toll Free Main Office

## 2020-02-06 ENCOUNTER — Other Ambulatory Visit: Payer: Self-pay | Admitting: Internal Medicine

## 2020-02-06 DIAGNOSIS — E538 Deficiency of other specified B group vitamins: Secondary | ICD-10-CM

## 2020-02-18 ENCOUNTER — Other Ambulatory Visit: Payer: Self-pay | Admitting: Internal Medicine

## 2020-02-18 DIAGNOSIS — E538 Deficiency of other specified B group vitamins: Secondary | ICD-10-CM

## 2020-02-28 ENCOUNTER — Other Ambulatory Visit: Payer: Self-pay | Admitting: Internal Medicine

## 2020-02-28 DIAGNOSIS — E538 Deficiency of other specified B group vitamins: Secondary | ICD-10-CM

## 2021-09-09 ENCOUNTER — Ambulatory Visit
Admission: EM | Admit: 2021-09-09 | Discharge: 2021-09-09 | Disposition: A | Discharge status: Home / Self Care | Payer: Medicare HMO

## 2021-09-09 ENCOUNTER — Encounter: Payer: Self-pay | Admitting: Oncology

## 2021-09-09 ENCOUNTER — Other Ambulatory Visit: Payer: Self-pay

## 2021-09-09 ENCOUNTER — Ambulatory Visit (INDEPENDENT_AMBULATORY_CARE_PROVIDER_SITE_OTHER): Payer: Medicare HMO

## 2021-09-09 DIAGNOSIS — S2241XA Multiple fractures of ribs, right side, initial encounter for closed fracture: Secondary | ICD-10-CM | POA: Diagnosis not present

## 2021-09-09 MED ORDER — ONDANSETRON 8 MG PO TBDP
8.0000 mg | ORAL_TABLET | Freq: Three times a day (TID) | ORAL | 0 refills | Status: AC | PRN
Start: 2021-09-09 — End: ?

## 2021-09-09 NOTE — ED Provider Notes (Signed)
[facility]    CSN: [id number] Arrival date & time: 09/09/21  0802      History   Chief Complaint Chief Complaint  Patient presents with   Chest Pain    Right mid rib pain    HPI Katherine Baird is a 71 y.o. female.   HPI  71 year old female here for evaluation of right-sided chest pain.  Patient reports that she has been experiencing pain in her right ribs that radiates up underneath her right breast since the beginning of December, so approximately 2 months ago, and the pain has drastically increased in the last 2 days.  She was evaluated by her PCP on 15 August 2021 who thought that the pain could possibly be coming from deconditioning as the patient had recently recovered from an illness and is starting to become active again.  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  At the office visit a urinalysis was checked which showed trace protein, 1+ blood, and 1+ leukocyte esterase.  No nitrites.  The urine was to be sent for culture but there is no culture result in epic.  She was treated with a 7-day course of Cipro at that time and the caregiver reports that there has been no improvement of the pain.  Patient denies any fever, cough, or shortness of breath.  Patient rates the pain as being severe and is causing her to be nauseated.  Past Medical History:  Diagnosis Date   Acute midline thoracic back pain 06/23/2018   Arthritis    CHF (congestive heart failure) ([place])    Hypertension    Myocardial infarction [facility])    Osteoporosis    Thyroid disease     Patient Active Problem List   Diagnosis Date Noted   Muscular deconditioning 01/06/2019   Vitamin B12 nutritional deficiency 11/03/2018   Age-related osteoporosis with current pathological fracture of vertebra with routine healing 09/30/2018   Personal history of fall 09/30/2018   HNP (herniated  nucleus pulposus), thoracic 09/22/2018   Paroxysmal atrial fibrillation ([place]) 08/28/2018   Mood disorder ([place]) 08/10/2018   Severe protein-calorie malnutrition ([place]) 08/10/2018   Late effect of lacunar infarction 08/10/2018   Takotsubo cardiomyopathy 08/10/2018   Constipation 07/30/2018   Bladder infection 07/14/2018   Old myocardial infarction 07/02/2018   Iron deficiency anemia 02/08/2018   Fatigue 01/13/2018   Chronic diastolic heart failure ([place]) 12/17/2017   Tooth disorder 02/24/2017   Syncope 02/24/2017   Hypothyroidism 05/24/2016   HLD (hyperlipidemia) 01/27/2015   Paroxysmal digital cyanosis 01/27/2015   Essential (primary) hypertension 01/11/2015   Rheumatoid arthritis involving multiple joints ([place]) 01/11/2015   Pelvic relaxation due to vaginal prolapse 05/30/2014    Past Surgical History:  Procedure Laterality Date   LAPAROSCOPIC HYSTERECTOMY  2001   total   LEFT HEART CATH N/A 07/03/2018   Procedure: Left Heart Cath with Coronary Angiography;  Surgeon: Isaias Cowman, MD;  Location: [place] [facility];  Service: Cardiovascular;  Laterality: N/A;    OB History   No obstetric history on file.      Home Medications    Prior to Admission medications   Medication Sig Start Date End Date Taking? Authorizing Provider  carvedilol (COREG) 6.25 MG tablet Take 6.25 mg by mouth 2 (two) times daily with a meal.    Yes [provider]  clobetasol ointment (TEMOVATE)  [id number] % Apply 1 application topically 2 (two) times daily as needed (for irritation).    Yes [provider]  folic acid (FOLVITE) 1 MG tablet Take 1 mg by mouth daily.   Yes [provider]  gabapentin (NEURONTIN) 100 MG capsule Take 100 mg by mouth 4 (four) times daily.  10/27/18  Yes [provider]  levothyroxine (SYNTHROID, LEVOTHROID) 50 MCG tablet TAKE 1 TABLET BY MOUTH  DAILY 10/30/18  Yes Glean Hess, MD  methotrexate [facility]) 2.5 MG tablet Take by mouth.  08/13/21  Yes [provider]  ondansetron (ZOFRAN-ODT) 8 MG disintegrating tablet Take 1 tablet (8 mg total) by mouth every 8 (eight) hours as needed for nausea or vomiting. 09/09/21  Yes Margarette Canada, NP  traMADol (ULTRAM) 50 MG tablet Take 1 tablet (50 mg total) by mouth every 6 (six) hours as needed for moderate pain or severe pain. Patient taking differently: Take 50 mg by mouth 3 (three) times daily. 07/03/18  Yes Henreitta Leber, MD    Family History Family History  Problem Relation Age of Onset   Arthritis Mother    Dementia Mother    Heart disease Father    Mesothelioma Father    Kidney cancer Sister    Heart disease Sister    Arthritis Sister    COPD Brother    Arthritis Brother    Arthritis Brother    COPD Sister     Social History Social History   Tobacco Use   Smoking status: Never   Smokeless tobacco: Never  Vaping Use   Vaping Use: Never used  Substance Use Topics   Alcohol use: No    Alcohol/week: 0.0 standard drinks   Drug use: No     Allergies   Levofloxacin, Doxycycline, Erythromycin, Gabapentin, Guaifenesin, Nitrofuran derivatives, Prednisone, Sulfa antibiotics, Tetracycline, Azithromycin, and Carvedilol   Review of Systems Review of Systems  Constitutional:  Negative for activity change, appetite change and fever.  Respiratory:  Negative for cough and shortness of breath.   Cardiovascular:  Positive for chest pain.       Right lateral and anterior chest wall pain underneath right breast.  Gastrointestinal:  Positive for nausea. Negative for vomiting.  Genitourinary:  Negative for dysuria, frequency, hematuria and urgency.  Skin:  Negative for rash.  Hematological: Negative.   Psychiatric/Behavioral: Negative.      Physical Exam Triage Vital Signs ED Triage Vitals  Enc Vitals Group     BP 09/09/21 0814 (!) 147/89     Pulse Rate 09/09/21 0814 61     Resp 09/09/21 0814 18     Temp 09/09/21 0814 97.6 F (36.4 C)     Temp Source  09/09/21 0814 Oral     SpO2 09/09/21 0814 100 %     Weight 09/09/21 0811 107 lb (48.5 kg)     Height 09/09/21 0811 5' (1.524 m)     Head Circumference --      Peak Flow --      Pain Score 09/09/21 0810 8     Pain Loc --      Pain Edu? --      Excl. in [place]? --    No data found.  Updated Vital Signs BP (!) 147/89 (BP Location: Left Arm)    Pulse 61    Temp 97.6 F (36.4 C) (Oral)    Resp 18    Ht 5' (1.524 m)    Wt 107 lb (48.5 kg)  SpO2 100%    BMI 20.90 kg/m   Visual Acuity Right Eye Distance:   Left Eye Distance:   Bilateral Distance:    Right Eye Near:   Left Eye Near:    Bilateral Near:     Physical Exam Vitals and nursing note reviewed.  Constitutional:      General: She is not in acute distress.    Appearance: Normal appearance. She is not ill-appearing.  HENT:     Head: Normocephalic and atraumatic.  Cardiovascular:     Rate and Rhythm: Normal rate and regular rhythm.     Pulses: Normal pulses.     Heart sounds: Normal heart sounds. No murmur heard.   No friction rub. No gallop.  Pulmonary:     Effort: Pulmonary effort is normal.     Breath sounds: Normal breath sounds. No wheezing, rhonchi or rales.     Comments: Right-sided chest wall to mid axillary line is tender along the fifth and sixth ribs.  The tenderness tracks anteriorly with palpation.  There is no ecchymosis, erythema, or abrasion noted to this portion of the chest wall.  There is however an abrasion noted to the posterior right flank. Chest:     Chest wall: Tenderness present.  Skin:    General: Skin is warm and dry.     Capillary Refill: Capillary refill takes less than 2 seconds.     Findings: No erythema or rash.     Comments: Abrasion to posterior right flank as noted in respiratory section.  Neurological:     General: No focal deficit present.     Mental Status: She is alert. Mental status is at baseline.  Psychiatric:        Mood and Affect: Mood normal.        Behavior: Behavior  normal.        Thought Content: Thought content normal.        Judgment: Judgment normal.     UC Treatments / Results  Labs (all labs ordered are listed, but only abnormal results are displayed) Labs Reviewed - No data to display  EKG   Radiology DG Ribs Unilateral W/Chest Right  Result Date: 09/09/2021 CLINICAL DATA:  71 year old female with history of right-sided rib pain for the past 2 months. EXAM: RIGHT RIBS AND CHEST - 3+ VIEW COMPARISON:  Chest x-ray 07/08/2018. FINDINGS: Lung volumes are normal. Mild eventration of the right hemidiaphragm. No consolidative airspace disease. No pleural effusions. No pneumothorax. No pulmonary nodule or mass noted. Moderate to large hiatal hernia. Pulmonary vasculature and the cardiomediastinal silhouette are otherwise within normal limits. Dedicated views of the right ribs demonstrate no definite acute displaced right-sided rib fractures. Old healed fracture of the lateral aspect of the right fourth rib. Healing nondisplaced fracture of the lateral aspect of the right sixth rib. IMPRESSION: 1. No radiographic evidence of acute cardiopulmonary disease. 2. No acute right-sided rib fractures are identified. However, there are old fractures of the lateral right fourth and sixth ribs. The right sixth rib fracture appears to be healing, while the right fourth rib fracture is well healed. 3. Moderate to large hiatal hernia. Electronically Signed   By: Vinnie Langton M.D.   On: 09/09/2021 08:53    Procedures Procedures (including critical care time)  Medications Ordered in UC Medications - No data to display  Initial Impression / Assessment and Plan / UC Course  I have reviewed the triage vital signs and the nursing notes.  Pertinent labs & imaging  results that were available during my care of the patient were reviewed by me and considered in my medical decision making (see chart for details).  71 year old female here for evaluation of approximately 2  months worth of right-sided chest wall pain that radiates anteriorly underneath her right breast.  She was evaluated approximately 3 weeks ago by her PCP who treated her for UTI and also thought that the pain may be coming from her kyphosis and deconditioning.  She had recently recovered from a long-term illness and had been largely immobile.  Her caregiver reports that she is increased her physical activity and is also traversing up and down stairs, something she has not done as long as her caregiver has known her.  Patient was referred to home physical therapy but did not remember her appointment last Monday and was not present when PT showed up.  She has not done any home physical therapy.  She states the pain is severe and is causing her to be nauseated.  No shortness of breath or cough accompany this pain.  Patient is unaware of any falls but has short-term memory loss and there is an abrasion present on her posterior right flank of unknown chronicity.  On physical exam patient has clear lung sounds in all fields.  She is tender over the fifth and sixth rib stuck in the mid axillary line and tracking anteriorly underneath her right breast.  No ecchymosis, edema, or erythema noted.  Her lung sounds are clear to auscultation all fields.  We will obtain right rib and chest x-ray to look for any bony derangement.  Suspect this is musculoskeletal in nature.  Right rib and chest x-ray independently reviewed and evaluated by me.  Impression: No perceivable rib fracture noted on the right.  The lung spaces are well pneumatized.  The right hemidiaphragm appears to be elevated but the may be secondary to patient's kyphosis and positioning.  Patient has significant kyphosis.  No pneumonia is noted.  Radiology overread is pending. Radiology impression is no radiographic evidence of acute cardiopulmonary disease.  No acute right-sided rib fractures are identified.  However there are old rib fractures at the lateral  fourth and sixth ribs.  The sixth rib fracture appears to be healing and the fourth rib fracture is well-healed.  Moderate to large hiatal hernia.  Will discharge patient home with diagnosis of rib fractures.  She is already prescribed tramadol 3 times a day and she can increase that to 4 times a day as needed.  I will also prescribe Zofran to help her with the nausea.  I have encouraged deep breathing and for her to follow-up with her PCP.   Final Clinical Impressions(s) / UC Diagnoses   Final diagnoses:  Closed fracture of multiple ribs of right side, initial encounter     Discharge Instructions      Your chest x-ray today showed old rib fractures of your right fourth and sixth ribs.  The fourth rib has healed but the sixth rib is still in the process of healing.  You need to make sure that you are taking deep breaths, at least 10 an hour, so that she maintain lung volume and prevent a pneumonia from forming.  You may use Tylenol as needed for mild to moderate pain.  Use your tramadol every 6 hours as needed for severe pain.  I will also give you some Zofran you may use for nausea as needed for either pain or with the pain medication.  Follow-up with your primary care provider.     ED Prescriptions     Medication Sig Dispense Auth. Provider   ondansetron (ZOFRAN-ODT) 8 MG disintegrating tablet Take 1 tablet (8 mg total) by mouth every 8 (eight) hours as needed for nausea or vomiting. 20 tablet Margarette Canada, NP      PDMP not reviewed this encounter.   Margarette Canada, NP 09/09/21 [phone]

## 2021-09-09 NOTE — ED Triage Notes (Signed)
Pt here with caretaker, pt C/O right rib pain for over a month, was seen in December treated for UTI states the pain has not got any better. States pain starts under right breast. Denies falls or trauma.

## 2021-09-09 NOTE — Discharge Instructions (Addendum)
Your chest x-ray today showed old rib fractures of your right fourth and sixth ribs.  The fourth rib has healed but the sixth rib is still in the process of healing.  You need to make sure that you are taking deep breaths, at least 10 an hour, so that she maintain lung volume and prevent a pneumonia from forming.  You may use Tylenol as needed for mild to moderate pain.  Use your tramadol every 6 hours as needed for severe pain.  I will also give you some Zofran you may use for nausea as needed for either pain or with the pain medication.  Follow-up with your primary care provider.

## 2022-03-01 ENCOUNTER — Other Ambulatory Visit: Payer: Self-pay | Admitting: Nephrology

## 2022-03-01 DIAGNOSIS — N1832 Chronic kidney disease, stage 3b: Secondary | ICD-10-CM

## 2022-03-01 DIAGNOSIS — R8290 Unspecified abnormal findings in urine: Secondary | ICD-10-CM

## 2022-05-08 DIAGNOSIS — M8008XD Age-related osteoporosis with current pathological fracture, vertebra(e), subsequent encounter for fracture with routine healing: Secondary | ICD-10-CM | POA: Diagnosis not present

## 2022-05-08 DIAGNOSIS — E538 Deficiency of other specified B group vitamins: Secondary | ICD-10-CM | POA: Diagnosis not present

## 2022-05-08 DIAGNOSIS — E079 Disorder of thyroid, unspecified: Secondary | ICD-10-CM | POA: Diagnosis not present

## 2022-05-08 DIAGNOSIS — G2581 Restless legs syndrome: Secondary | ICD-10-CM | POA: Diagnosis not present

## 2022-05-08 DIAGNOSIS — Z0000 Encounter for general adult medical examination without abnormal findings: Secondary | ICD-10-CM | POA: Diagnosis not present

## 2022-05-08 DIAGNOSIS — N183 Chronic kidney disease, stage 3 unspecified: Secondary | ICD-10-CM | POA: Diagnosis not present

## 2022-05-08 DIAGNOSIS — E782 Mixed hyperlipidemia: Secondary | ICD-10-CM | POA: Diagnosis not present

## 2022-05-08 DIAGNOSIS — I10 Essential (primary) hypertension: Secondary | ICD-10-CM | POA: Diagnosis not present

## 2022-05-15 DIAGNOSIS — M5136 Other intervertebral disc degeneration, lumbar region: Secondary | ICD-10-CM | POA: Diagnosis not present

## 2022-05-15 DIAGNOSIS — M5126 Other intervertebral disc displacement, lumbar region: Secondary | ICD-10-CM | POA: Diagnosis not present

## 2022-05-15 DIAGNOSIS — M5030 Other cervical disc degeneration, unspecified cervical region: Secondary | ICD-10-CM | POA: Diagnosis not present

## 2022-05-15 DIAGNOSIS — M5416 Radiculopathy, lumbar region: Secondary | ICD-10-CM | POA: Diagnosis not present

## 2022-06-17 DIAGNOSIS — I10 Essential (primary) hypertension: Secondary | ICD-10-CM | POA: Diagnosis not present

## 2022-06-17 DIAGNOSIS — M8008XD Age-related osteoporosis with current pathological fracture, vertebra(e), subsequent encounter for fracture with routine healing: Secondary | ICD-10-CM | POA: Diagnosis not present

## 2022-06-17 DIAGNOSIS — Z23 Encounter for immunization: Secondary | ICD-10-CM | POA: Diagnosis not present

## 2022-06-17 DIAGNOSIS — E079 Disorder of thyroid, unspecified: Secondary | ICD-10-CM | POA: Diagnosis not present

## 2022-06-17 DIAGNOSIS — G2581 Restless legs syndrome: Secondary | ICD-10-CM | POA: Diagnosis not present

## 2022-06-17 DIAGNOSIS — N183 Chronic kidney disease, stage 3 unspecified: Secondary | ICD-10-CM | POA: Diagnosis not present

## 2022-06-17 DIAGNOSIS — E782 Mixed hyperlipidemia: Secondary | ICD-10-CM | POA: Diagnosis not present

## 2022-06-28 DIAGNOSIS — I10 Essential (primary) hypertension: Secondary | ICD-10-CM | POA: Diagnosis not present

## 2022-07-10 ENCOUNTER — Ambulatory Visit: Payer: Self-pay | Admitting: *Deleted

## 2022-07-10 NOTE — Telephone Encounter (Signed)
  Patient called in states can't stop coughing, thick green phlegm, didn't want to make an appt. She is asking for assistance and meds. Please call back     Attempted to reach pt, VM full. Unable to leave message to call back,

## 2022-07-10 NOTE — Telephone Encounter (Signed)
2nd attempt. Pt called, unable to LVM d/t VM full.   Summary: coughing for 2 weeks   Patient called in states can't stop coughing, thick green phlegm, didn't want to make an appt. She is asking for assistance and meds. Please call back

## 2022-07-11 DIAGNOSIS — J40 Bronchitis, not specified as acute or chronic: Secondary | ICD-10-CM | POA: Diagnosis not present

## 2022-07-11 DIAGNOSIS — Z881 Allergy status to other antibiotic agents status: Secondary | ICD-10-CM | POA: Diagnosis not present

## 2022-07-11 DIAGNOSIS — I10 Essential (primary) hypertension: Secondary | ICD-10-CM | POA: Diagnosis not present

## 2022-07-25 DIAGNOSIS — G629 Polyneuropathy, unspecified: Secondary | ICD-10-CM | POA: Diagnosis not present

## 2022-07-25 DIAGNOSIS — E43 Unspecified severe protein-calorie malnutrition: Secondary | ICD-10-CM | POA: Diagnosis not present

## 2022-07-25 DIAGNOSIS — N1832 Chronic kidney disease, stage 3b: Secondary | ICD-10-CM | POA: Diagnosis not present

## 2022-07-25 DIAGNOSIS — I10 Essential (primary) hypertension: Secondary | ICD-10-CM | POA: Diagnosis not present

## 2022-07-25 DIAGNOSIS — L948 Other specified localized connective tissue disorders: Secondary | ICD-10-CM | POA: Diagnosis not present

## 2022-07-25 DIAGNOSIS — F0150 Vascular dementia without behavioral disturbance: Secondary | ICD-10-CM | POA: Diagnosis not present

## 2022-07-25 DIAGNOSIS — M1000 Idiopathic gout, unspecified site: Secondary | ICD-10-CM | POA: Diagnosis not present

## 2022-07-25 DIAGNOSIS — G2581 Restless legs syndrome: Secondary | ICD-10-CM | POA: Diagnosis not present

## 2022-07-25 DIAGNOSIS — R8290 Unspecified abnormal findings in urine: Secondary | ICD-10-CM | POA: Diagnosis not present

## 2022-07-25 DIAGNOSIS — R4189 Other symptoms and signs involving cognitive functions and awareness: Secondary | ICD-10-CM | POA: Diagnosis not present

## 2022-08-01 DIAGNOSIS — I10 Essential (primary) hypertension: Secondary | ICD-10-CM | POA: Diagnosis not present

## 2022-08-01 DIAGNOSIS — N1832 Chronic kidney disease, stage 3b: Secondary | ICD-10-CM | POA: Diagnosis not present

## 2022-08-01 DIAGNOSIS — F0150 Vascular dementia without behavioral disturbance: Secondary | ICD-10-CM | POA: Diagnosis not present

## 2022-08-01 DIAGNOSIS — L948 Other specified localized connective tissue disorders: Secondary | ICD-10-CM | POA: Diagnosis not present

## 2022-08-01 DIAGNOSIS — M1000 Idiopathic gout, unspecified site: Secondary | ICD-10-CM | POA: Diagnosis not present

## 2022-08-01 DIAGNOSIS — R8290 Unspecified abnormal findings in urine: Secondary | ICD-10-CM | POA: Diagnosis not present

## 2022-08-06 DIAGNOSIS — I7300 Raynaud's syndrome without gangrene: Secondary | ICD-10-CM | POA: Diagnosis not present

## 2022-08-06 DIAGNOSIS — M1A00X0 Idiopathic chronic gout, unspecified site, without tophus (tophi): Secondary | ICD-10-CM | POA: Diagnosis not present

## 2022-08-06 DIAGNOSIS — Z79899 Other long term (current) drug therapy: Secondary | ICD-10-CM | POA: Diagnosis not present

## 2022-08-06 DIAGNOSIS — M359 Systemic involvement of connective tissue, unspecified: Secondary | ICD-10-CM | POA: Diagnosis not present

## 2022-08-06 DIAGNOSIS — M810 Age-related osteoporosis without current pathological fracture: Secondary | ICD-10-CM | POA: Diagnosis not present

## 2022-11-20 ENCOUNTER — Encounter: Payer: Self-pay | Admitting: Oncology

## 2022-11-20 ENCOUNTER — Other Ambulatory Visit: Payer: Self-pay | Admitting: Student

## 2022-11-20 DIAGNOSIS — Q283 Other malformations of cerebral vessels: Secondary | ICD-10-CM

## 2022-11-26 ENCOUNTER — Encounter: Payer: Self-pay | Admitting: Oncology

## 2022-11-28 ENCOUNTER — Ambulatory Visit
Admission: RE | Admit: 2022-11-28 | Discharge: 2022-11-28 | Disposition: A | Discharge status: Home / Self Care | Payer: Medicare HMO | Source: Ambulatory Visit | Attending: Student | Admitting: Student

## 2022-11-28 DIAGNOSIS — Q283 Other malformations of cerebral vessels: Secondary | ICD-10-CM | POA: Insufficient documentation

## 2023-07-28 ENCOUNTER — Other Ambulatory Visit: Payer: Self-pay | Admitting: Nephrology

## 2023-07-28 DIAGNOSIS — N1832 Chronic kidney disease, stage 3b: Secondary | ICD-10-CM

## 2023-07-30 ENCOUNTER — Ambulatory Visit
Admission: RE | Admit: 2023-07-30 | Discharge: 2023-07-30 | Disposition: A | Discharge status: Home / Self Care | Payer: Medicare HMO | Source: Ambulatory Visit | Attending: Nephrology | Admitting: Nephrology

## 2023-07-30 DIAGNOSIS — N1832 Chronic kidney disease, stage 3b: Secondary | ICD-10-CM | POA: Diagnosis present

## 2023-12-15 DIAGNOSIS — E039 Hypothyroidism, unspecified: Secondary | ICD-10-CM | POA: Diagnosis not present

## 2023-12-15 DIAGNOSIS — Z1331 Encounter for screening for depression: Secondary | ICD-10-CM | POA: Diagnosis not present

## 2023-12-15 DIAGNOSIS — D649 Anemia, unspecified: Secondary | ICD-10-CM | POA: Diagnosis not present

## 2023-12-15 DIAGNOSIS — Z0000 Encounter for general adult medical examination without abnormal findings: Secondary | ICD-10-CM | POA: Diagnosis not present

## 2023-12-15 DIAGNOSIS — R634 Abnormal weight loss: Secondary | ICD-10-CM | POA: Diagnosis not present

## 2023-12-15 DIAGNOSIS — N189 Chronic kidney disease, unspecified: Secondary | ICD-10-CM | POA: Diagnosis not present

## 2023-12-15 DIAGNOSIS — F0393 Unspecified dementia, unspecified severity, with mood disturbance: Secondary | ICD-10-CM | POA: Diagnosis not present

## 2023-12-15 DIAGNOSIS — M810 Age-related osteoporosis without current pathological fracture: Secondary | ICD-10-CM | POA: Diagnosis not present

## 2023-12-20 DIAGNOSIS — S92334A Nondisplaced fracture of third metatarsal bone, right foot, initial encounter for closed fracture: Secondary | ICD-10-CM | POA: Diagnosis not present

## 2023-12-30 DIAGNOSIS — S92334A Nondisplaced fracture of third metatarsal bone, right foot, initial encounter for closed fracture: Secondary | ICD-10-CM | POA: Diagnosis not present

## 2024-01-20 DIAGNOSIS — S92334A Nondisplaced fracture of third metatarsal bone, right foot, initial encounter for closed fracture: Secondary | ICD-10-CM | POA: Diagnosis not present

## 2024-02-10 DIAGNOSIS — S92334A Nondisplaced fracture of third metatarsal bone, right foot, initial encounter for closed fracture: Secondary | ICD-10-CM | POA: Diagnosis not present

## 2024-02-29 DIAGNOSIS — S8261XA Displaced fracture of lateral malleolus of right fibula, initial encounter for closed fracture: Secondary | ICD-10-CM | POA: Diagnosis not present

## 2024-02-29 DIAGNOSIS — S8264XA Nondisplaced fracture of lateral malleolus of right fibula, initial encounter for closed fracture: Secondary | ICD-10-CM | POA: Diagnosis not present

## 2024-03-04 DIAGNOSIS — M1A00X0 Idiopathic chronic gout, unspecified site, without tophus (tophi): Secondary | ICD-10-CM | POA: Diagnosis not present

## 2024-03-04 DIAGNOSIS — Z79899 Other long term (current) drug therapy: Secondary | ICD-10-CM | POA: Diagnosis not present

## 2024-03-04 DIAGNOSIS — I7300 Raynaud's syndrome without gangrene: Secondary | ICD-10-CM | POA: Diagnosis not present

## 2024-03-04 DIAGNOSIS — M359 Systemic involvement of connective tissue, unspecified: Secondary | ICD-10-CM | POA: Diagnosis not present

## 2024-03-04 DIAGNOSIS — M810 Age-related osteoporosis without current pathological fracture: Secondary | ICD-10-CM | POA: Diagnosis not present

## 2024-03-10 DIAGNOSIS — N281 Cyst of kidney, acquired: Secondary | ICD-10-CM | POA: Diagnosis not present

## 2024-03-10 DIAGNOSIS — F0150 Vascular dementia without behavioral disturbance: Secondary | ICD-10-CM | POA: Diagnosis not present

## 2024-03-10 DIAGNOSIS — M0580 Other rheumatoid arthritis with rheumatoid factor of unspecified site: Secondary | ICD-10-CM | POA: Diagnosis not present

## 2024-03-10 DIAGNOSIS — M1000 Idiopathic gout, unspecified site: Secondary | ICD-10-CM | POA: Diagnosis not present

## 2024-03-10 DIAGNOSIS — I10 Essential (primary) hypertension: Secondary | ICD-10-CM | POA: Diagnosis not present

## 2024-03-10 DIAGNOSIS — N261 Atrophy of kidney (terminal): Secondary | ICD-10-CM | POA: Diagnosis not present

## 2024-03-10 DIAGNOSIS — M3500 Sicca syndrome, unspecified: Secondary | ICD-10-CM | POA: Diagnosis not present

## 2024-03-10 DIAGNOSIS — G4709 Other insomnia: Secondary | ICD-10-CM | POA: Diagnosis not present

## 2024-03-10 DIAGNOSIS — N1832 Chronic kidney disease, stage 3b: Secondary | ICD-10-CM | POA: Diagnosis not present

## 2024-03-10 DIAGNOSIS — L948 Other specified localized connective tissue disorders: Secondary | ICD-10-CM | POA: Diagnosis not present

## 2024-03-11 DIAGNOSIS — S8264XD Nondisplaced fracture of lateral malleolus of right fibula, subsequent encounter for closed fracture with routine healing: Secondary | ICD-10-CM | POA: Diagnosis not present

## 2024-03-23 DIAGNOSIS — S8264XD Nondisplaced fracture of lateral malleolus of right fibula, subsequent encounter for closed fracture with routine healing: Secondary | ICD-10-CM | POA: Diagnosis not present

## 2024-03-25 DIAGNOSIS — M25571 Pain in right ankle and joints of right foot: Secondary | ICD-10-CM | POA: Diagnosis not present

## 2024-03-25 DIAGNOSIS — M25671 Stiffness of right ankle, not elsewhere classified: Secondary | ICD-10-CM | POA: Diagnosis not present

## 2024-04-01 DIAGNOSIS — M25671 Stiffness of right ankle, not elsewhere classified: Secondary | ICD-10-CM | POA: Diagnosis not present

## 2024-04-01 DIAGNOSIS — M25571 Pain in right ankle and joints of right foot: Secondary | ICD-10-CM | POA: Diagnosis not present

## 2024-04-07 DIAGNOSIS — M25671 Stiffness of right ankle, not elsewhere classified: Secondary | ICD-10-CM | POA: Diagnosis not present

## 2024-04-07 DIAGNOSIS — M25571 Pain in right ankle and joints of right foot: Secondary | ICD-10-CM | POA: Diagnosis not present

## 2024-04-20 DIAGNOSIS — S82891D Other fracture of right lower leg, subsequent encounter for closed fracture with routine healing: Secondary | ICD-10-CM | POA: Diagnosis not present

## 2024-04-20 DIAGNOSIS — D631 Anemia in chronic kidney disease: Secondary | ICD-10-CM | POA: Diagnosis not present

## 2024-04-20 DIAGNOSIS — I10 Essential (primary) hypertension: Secondary | ICD-10-CM | POA: Diagnosis not present

## 2024-04-20 DIAGNOSIS — N1832 Chronic kidney disease, stage 3b: Secondary | ICD-10-CM | POA: Diagnosis not present

## 2024-05-05 DIAGNOSIS — R4189 Other symptoms and signs involving cognitive functions and awareness: Secondary | ICD-10-CM | POA: Diagnosis not present

## 2024-05-05 DIAGNOSIS — N189 Chronic kidney disease, unspecified: Secondary | ICD-10-CM | POA: Diagnosis not present

## 2024-05-05 DIAGNOSIS — G2581 Restless legs syndrome: Secondary | ICD-10-CM | POA: Diagnosis not present

## 2024-05-05 DIAGNOSIS — Z8781 Personal history of (healed) traumatic fracture: Secondary | ICD-10-CM | POA: Diagnosis not present

## 2024-05-05 DIAGNOSIS — M0520 Rheumatoid vasculitis with rheumatoid arthritis of unspecified site: Secondary | ICD-10-CM | POA: Diagnosis not present

## 2024-05-05 DIAGNOSIS — G629 Polyneuropathy, unspecified: Secondary | ICD-10-CM | POA: Diagnosis not present

## 2024-05-05 DIAGNOSIS — E569 Vitamin deficiency, unspecified: Secondary | ICD-10-CM | POA: Diagnosis not present

## 2024-05-05 DIAGNOSIS — M810 Age-related osteoporosis without current pathological fracture: Secondary | ICD-10-CM | POA: Diagnosis not present

## 2024-05-17 DIAGNOSIS — M5380 Other specified dorsopathies, site unspecified: Secondary | ICD-10-CM | POA: Diagnosis not present

## 2024-05-17 DIAGNOSIS — M5126 Other intervertebral disc displacement, lumbar region: Secondary | ICD-10-CM | POA: Diagnosis not present

## 2024-05-17 DIAGNOSIS — Z79899 Other long term (current) drug therapy: Secondary | ICD-10-CM | POA: Diagnosis not present

## 2024-06-15 DIAGNOSIS — N189 Chronic kidney disease, unspecified: Secondary | ICD-10-CM | POA: Diagnosis not present

## 2024-06-15 DIAGNOSIS — E782 Mixed hyperlipidemia: Secondary | ICD-10-CM | POA: Diagnosis not present

## 2024-06-15 DIAGNOSIS — Z2821 Immunization not carried out because of patient refusal: Secondary | ICD-10-CM | POA: Diagnosis not present

## 2024-06-15 DIAGNOSIS — N2889 Other specified disorders of kidney and ureter: Secondary | ICD-10-CM | POA: Diagnosis not present

## 2024-06-15 DIAGNOSIS — E079 Disorder of thyroid, unspecified: Secondary | ICD-10-CM | POA: Diagnosis not present

## 2024-06-15 DIAGNOSIS — I10 Essential (primary) hypertension: Secondary | ICD-10-CM | POA: Diagnosis not present

## 2024-06-15 DIAGNOSIS — Z23 Encounter for immunization: Secondary | ICD-10-CM | POA: Diagnosis not present

## 2024-06-15 DIAGNOSIS — F4321 Adjustment disorder with depressed mood: Secondary | ICD-10-CM | POA: Diagnosis not present

## 2024-06-15 DIAGNOSIS — F339 Major depressive disorder, recurrent, unspecified: Secondary | ICD-10-CM | POA: Diagnosis not present

## 2024-06-15 DIAGNOSIS — F03A0 Unspecified dementia, mild, without behavioral disturbance, psychotic disturbance, mood disturbance, and anxiety: Secondary | ICD-10-CM | POA: Diagnosis not present

## 2024-06-15 DIAGNOSIS — E039 Hypothyroidism, unspecified: Secondary | ICD-10-CM | POA: Diagnosis not present

## 2024-06-15 DIAGNOSIS — S93401A Sprain of unspecified ligament of right ankle, initial encounter: Secondary | ICD-10-CM | POA: Diagnosis not present

## 2024-06-15 DIAGNOSIS — D649 Anemia, unspecified: Secondary | ICD-10-CM | POA: Diagnosis not present

## 2024-06-15 DIAGNOSIS — N1832 Chronic kidney disease, stage 3b: Secondary | ICD-10-CM | POA: Diagnosis not present

## 2024-06-15 DIAGNOSIS — R413 Other amnesia: Secondary | ICD-10-CM | POA: Diagnosis not present

## 2024-06-15 DIAGNOSIS — R42 Dizziness and giddiness: Secondary | ICD-10-CM | POA: Diagnosis not present

## 2024-06-15 DIAGNOSIS — M25571 Pain in right ankle and joints of right foot: Secondary | ICD-10-CM | POA: Diagnosis not present

## 2024-06-16 ENCOUNTER — Encounter: Payer: Self-pay | Admitting: Oncology

## 2024-06-29 DIAGNOSIS — S93401D Sprain of unspecified ligament of right ankle, subsequent encounter: Secondary | ICD-10-CM | POA: Diagnosis not present

## 2024-07-01 ENCOUNTER — Encounter: Payer: Self-pay | Admitting: Oncology

## 2024-07-01 ENCOUNTER — Inpatient Hospital Stay: Attending: Oncology | Admitting: Oncology

## 2024-07-01 ENCOUNTER — Inpatient Hospital Stay

## 2024-07-01 VITALS — BP 116/77 | HR 65 | Temp 98.4°F | Resp 18 | Ht 60.0 in | Wt 103.4 lb

## 2024-07-01 DIAGNOSIS — D649 Anemia, unspecified: Secondary | ICD-10-CM | POA: Insufficient documentation

## 2024-07-01 DIAGNOSIS — D509 Iron deficiency anemia, unspecified: Secondary | ICD-10-CM

## 2024-07-01 DIAGNOSIS — Z8051 Family history of malignant neoplasm of kidney: Secondary | ICD-10-CM | POA: Insufficient documentation

## 2024-07-01 LAB — IRON AND TIBC
Iron: 147 ug/dL (ref 28–170)
Saturation Ratios: 41 % — ABNORMAL HIGH (ref 10.4–31.8)
TIBC: 356 ug/dL (ref 250–450)
UIBC: 209 ug/dL

## 2024-07-01 LAB — CBC (CANCER CENTER ONLY)
HCT: 33.6 % — ABNORMAL LOW (ref 36.0–46.0)
Hemoglobin: 10.1 g/dL — ABNORMAL LOW (ref 12.0–15.0)
MCH: 28 pg (ref 26.0–34.0)
MCHC: 30.1 g/dL (ref 30.0–36.0)
MCV: 93.1 fL (ref 80.0–100.0)
Platelet Count: 246 K/uL (ref 150–400)
RBC: 3.61 MIL/uL — ABNORMAL LOW (ref 3.87–5.11)
RDW: 18.6 % — ABNORMAL HIGH (ref 11.5–15.5)
WBC Count: 6.5 K/uL (ref 4.0–10.5)
nRBC: 0 % (ref 0.0–0.2)

## 2024-07-01 LAB — VITAMIN B12: Vitamin B-12: 304 pg/mL (ref 180–914)

## 2024-07-01 LAB — LACTATE DEHYDROGENASE: LDH: 169 U/L (ref 98–192)

## 2024-07-01 LAB — FERRITIN: Ferritin: 19 ng/mL (ref 11–307)

## 2024-07-01 LAB — FOLATE: Folate: >20 ng/mL (ref >5.9)

## 2024-07-01 NOTE — Progress Notes (Signed)
 New patient; Anemia, unspecified, Chronic renal insufficiency, stage III (moderate)- Referred by Dr. Epifanio.

## 2024-07-01 NOTE — Progress Notes (Signed)
 [facility]  [phone] [phone]  ID: Katherine Baird OB: 06-12-1951  MR#: [id number]  RDW#:[id number]  Patient Care Team: Epifanio Alm SQUIBB, MD as PCP - General (Infectious Diseases) Hester Wolm PARAS, MD as Consulting Physician (Cardiology) Brandy Lucie Mini, MD as Referring Physician (Obstetrics and Gynecology) Chrystie Larissa RAMAN, MD as Referring Physician (Dermatology) Cleotilde Barrio, MD (Orthopedic Surgery) Jacobo Evalene PARAS, MD as Consulting Physician (Oncology)  CHIEF COMPLAINT: Anemia, unspecified.  INTERVAL HISTORY: Patient is a 73 year old female who was last seen in clinic in 2019 for iron deficiency anemia.  She is referred back for further evaluation and declining hemoglobin.  She currently feels well and is asymptomatic.  She does not complain of any weakness or fatigue.  She has a good appetite and denies weight loss.  She has no neurologic complaints.  She denies any recent fevers or illnesses.  She has no chest pain, shortness of breath, cough, or hemoptysis.  She denies any nausea, vomiting, constipation, or diarrhea.  Has no melena or hematochezia.  She has no urinary complaints.  Patient offers no specific complaints today.  REVIEW OF SYSTEMS:   Review of Systems  Constitutional: Negative.  Negative for fever, malaise/fatigue and weight loss.  Respiratory: Negative.  Negative for cough, hemoptysis and shortness of breath.   Cardiovascular: Negative.  Negative for chest pain and leg swelling.  Gastrointestinal: Negative.  Negative for abdominal pain.  Genitourinary: Negative.  Negative for dysuria.  Musculoskeletal: Negative.  Negative for back pain.  Skin: Negative.  Negative for rash.  Neurological: Negative.  Negative for dizziness, focal weakness, weakness and headaches.  Psychiatric/Behavioral: Negative.  The patient is not nervous/anxious.     As per HPI. Otherwise, a complete review of systems is negative.  PAST  MEDICAL HISTORY: Past Medical History:  Diagnosis Date   Acute midline thoracic back pain 06/23/2018   Arthritis    CHF (congestive heart failure) (HCC)    Hypertension    Myocardial infarction (HCC)    Osteoporosis    Thyroid  disease     PAST SURGICAL HISTORY: Past Surgical History:  Procedure Laterality Date   LAPAROSCOPIC HYSTERECTOMY  2001   total   LEFT HEART CATH N/A 07/03/2018   Procedure: Left Heart Cath with Coronary Angiography;  Surgeon: Ammon Blunt, MD;  Location: [facility] INVASIVE CV LAB;  Service: Cardiovascular;  Laterality: N/A;    FAMILY HISTORY: Family History  Problem Relation Age of Onset   Arthritis Mother    Dementia Mother    Heart disease Father    Mesothelioma Father    Kidney cancer Sister    Heart disease Sister    Arthritis Sister    COPD Brother    Arthritis Brother    Arthritis Brother    COPD Sister     ADVANCED DIRECTIVES (Y/N):  N  HEALTH MAINTENANCE: Social History   Tobacco Use   Smoking status: Never   Smokeless tobacco: Never  Vaping Use   Vaping status: Never Used  Substance Use Topics   Alcohol use: No    Alcohol/week: 0.0 standard drinks of alcohol   Drug use: No     Colonoscopy:  PAP:  Bone density:  Lipid panel:  Allergies  Allergen Reactions   Levofloxacin  Nausea And Vomiting   Valacyclovir  Other (See Comments)    Other Reaction(s): other  Burning and Peeling of skin  Burning and Peeling of skin  valacyclovir    Cephalexin      Other Reaction(s): Not available  Codeine     Other Reaction(s): Not available   Doxycycline    Erythromycin    Guaifenesin     Felt sick and worsening of symptoms   Neomycin     Other Reaction(s): Not available   Nitrofuran Derivatives    Sulfa Antibiotics    Tetracycline    Azithromycin Rash    Severe rash with peeling    Current Outpatient Medications  Medication Sig Dispense Refill   carvedilol  (COREG ) 6.25 MG tablet Take 6.25 mg by mouth 2 (two) times  daily with a meal.      Cholecalciferol (VITAMIN D3) 10 MCG (400 UNIT) tablet Take 800 Units by mouth.     clobetasol ointment (TEMOVATE) 0.05 % Apply 1 application topically 2 (two) times daily as needed (for irritation).      denosumab  (PROLIA ) 60 MG/ML SOSY injection Inject 60 mg into the skin.     folic acid  (FOLVITE ) 1 MG tablet Take 1 mg by mouth daily.     gabapentin (NEURONTIN) 100 MG capsule Take 100 mg by mouth 4 (four) times daily.      levothyroxine  (SYNTHROID , LEVOTHROID) 50 MCG tablet TAKE 1 TABLET BY MOUTH  DAILY 90 tablet 1   methotrexate (RHEUMATREX) 2.5 MG tablet Take by mouth.     Multiple Vitamin (MULTI-VITAMIN) tablet Take 1 tablet by mouth daily.     traMADol  (ULTRAM ) 50 MG tablet Take 1 tablet (50 mg total) by mouth every 6 (six) hours as needed for moderate pain or severe pain. (Patient taking differently: Take 50 mg by mouth 3 (three) times daily.) 30 tablet 0   triamcinolone  ointment (KENALOG ) 0.1 %      ALPRAZolam (XANAX) 0.25 MG tablet  (Patient not taking: Reported on 07/01/2024)     amLODipine (NORVASC) 2.5 MG tablet  (Patient not taking: Reported on 07/01/2024)     clindamycin (CLEOCIN) 150 MG capsule  (Patient not taking: Reported on 07/01/2024)     cyclobenzaprine (FLEXERIL) 5 MG tablet Take 1 tablet twice a day by oral route. (Patient not taking: Reported on 07/01/2024)     doxepin (SINEQUAN) 10 MG capsule  (Patient not taking: Reported on 07/01/2024)     isosorbide mononitrate (IMDUR) 30 MG 24 hr tablet  (Patient not taking: Reported on 07/01/2024)     mirtazapine  (REMERON ) 30 MG tablet Take 30 mg by mouth. (Patient not taking: Reported on 07/01/2024)     Multiple Vitamin (MULTIVITAMIN) tablet Take 1 tablet by mouth.     nitrofurantoin, macrocrystal-monohydrate, (MACROBID) 100 MG capsule  (Patient not taking: Reported on 07/01/2024)     ondansetron  (ZOFRAN ) 4 MG tablet Take 1 tablet every 4 hours by oral route as needed, for NAUSEA. (Patient not taking: Reported on  07/01/2024)     ondansetron  (ZOFRAN -ODT) 8 MG disintegrating tablet Take 1 tablet (8 mg total) by mouth every 8 (eight) hours as needed for nausea or vomiting. (Patient not taking: Reported on 07/01/2024) 20 tablet 0   rOPINIRole (REQUIP) 0.25 MG tablet Take 0.25 mg by mouth. (Patient not taking: Reported on 07/01/2024)     Current Facility-Administered Medications  Medication Dose Route Frequency Provider Last Rate Last Admin   cyanocobalamin  ((VITAMIN B-12)) injection 1,000 mcg  1,000 mcg Intramuscular Q30 days Justus Leita DEL, MD   1,000 mcg at 11/27/18 0802    OBJECTIVE: Vitals:   07/01/24 1027  BP: 116/77  Pulse: 65  Resp: 18  Temp: 98.4 F (36.9 C)  SpO2: 100%     Body mass  index is 20.19 kg/m.    ECOG FS:0 - Asymptomatic  General: Well-developed, well-nourished, no acute distress. Eyes: Pink conjunctiva, anicteric sclera. HEENT: Normocephalic, moist mucous membranes. Lungs: No audible wheezing or coughing. Heart: Regular rate and rhythm. Abdomen: Soft, nontender, no obvious distention. Musculoskeletal: No edema, cyanosis, or clubbing. Neuro: Alert, answering all questions appropriately. Cranial nerves grossly intact. Skin: No rashes or petechiae noted. Psych: Normal affect. Lymphatics: No cervical, calvicular, axillary or inguinal LAD.   LAB RESULTS:  Lab Results  Component Value Date   NA 140 03/04/2019   K 4.9 03/04/2019   CL 103 03/04/2019   CO2 29 03/04/2019   GLUCOSE 90 03/04/2019   BUN 29 (H) 03/04/2019   CREATININE 0.66 03/04/2019   CALCIUM 9.5 03/04/2019   PROT 7.1 03/04/2019   ALBUMIN 4.1 03/04/2019   AST 23 03/04/2019   ALT 14 03/04/2019   ALKPHOS 63 03/04/2019   BILITOT 0.4 03/04/2019   GFRNONAA >60 03/04/2019   GFRAA >60 03/04/2019    Lab Results  Component Value Date   WBC 6.5 07/01/2024   NEUTROABS 2.5 03/04/2019   HGB 10.1 (L) 07/01/2024   HCT 33.6 (L) 07/01/2024   MCV 93.1 07/01/2024   PLT 246 07/01/2024   Lab Results  Component  Value Date   IRON 147 07/01/2024   TIBC 356 07/01/2024   IRONPCTSAT 41 (H) 07/01/2024   Lab Results  Component Value Date   FERRITIN 19 07/01/2024     STUDIES: No results found.  ASSESSMENT: Anemia, unspecified.  PLAN:    Anemia, unspecified: Patient's hemoglobin is decreased, but relatively stable at 10.1.  Iron stores are essentially within normal limits.  Folate levels and hemolysis labs are also either negative or within normal limits.  SPEP and myeloid NGS panel are pending at time of dictation.  No intervention is needed.  Return to clinic in 4 weeks after the Thanksgiving holiday for further evaluation and discussion of her results.  I spent a total of 45 minutes reviewing chart data, face-to-face evaluation with the patient, counseling and coordination of care as detailed above.   Patient expressed understanding and was in agreement with this plan. She also understands that She can call clinic at any time with any questions, concerns, or complaints.    Evalene JINNY Reusing, MD   07/01/2024 1:35 PM

## 2024-07-03 LAB — HAPTOGLOBIN: Haptoglobin: 110 mg/dL (ref 42–346)

## 2024-07-05 DIAGNOSIS — R42 Dizziness and giddiness: Secondary | ICD-10-CM | POA: Diagnosis not present

## 2024-07-05 LAB — PROTEIN ELECTROPHORESIS, SERUM
A/G Ratio: 1.3 (ref 0.7–1.7)
Albumin ELP: 3.9 g/dL (ref 2.9–4.4)
Alpha-1-Globulin: 0.2 g/dL (ref 0.0–0.4)
Alpha-2-Globulin: 0.7 g/dL (ref 0.4–1.0)
Beta Globulin: 0.9 g/dL (ref 0.7–1.3)
Gamma Globulin: 1 g/dL (ref 0.4–1.8)
Globulin, Total: 2.9 g/dL (ref 2.2–3.9)
Total Protein ELP: 6.8 g/dL (ref 6.0–8.5)

## 2024-07-06 DIAGNOSIS — E782 Mixed hyperlipidemia: Secondary | ICD-10-CM | POA: Diagnosis not present

## 2024-07-06 DIAGNOSIS — D649 Anemia, unspecified: Secondary | ICD-10-CM | POA: Diagnosis not present

## 2024-07-06 DIAGNOSIS — I7300 Raynaud's syndrome without gangrene: Secondary | ICD-10-CM | POA: Diagnosis not present

## 2024-07-06 DIAGNOSIS — F339 Major depressive disorder, recurrent, unspecified: Secondary | ICD-10-CM | POA: Diagnosis not present

## 2024-07-06 DIAGNOSIS — I10 Essential (primary) hypertension: Secondary | ICD-10-CM | POA: Diagnosis not present

## 2024-07-06 DIAGNOSIS — F03A0 Unspecified dementia, mild, without behavioral disturbance, psychotic disturbance, mood disturbance, and anxiety: Secondary | ICD-10-CM | POA: Diagnosis not present

## 2024-07-06 DIAGNOSIS — E079 Disorder of thyroid, unspecified: Secondary | ICD-10-CM | POA: Diagnosis not present

## 2024-07-06 DIAGNOSIS — Z79899 Other long term (current) drug therapy: Secondary | ICD-10-CM | POA: Diagnosis not present

## 2024-07-06 DIAGNOSIS — M359 Systemic involvement of connective tissue, unspecified: Secondary | ICD-10-CM | POA: Diagnosis not present

## 2024-07-06 DIAGNOSIS — M1A00X0 Idiopathic chronic gout, unspecified site, without tophus (tophi): Secondary | ICD-10-CM | POA: Diagnosis not present

## 2024-07-06 DIAGNOSIS — M810 Age-related osteoporosis without current pathological fracture: Secondary | ICD-10-CM | POA: Diagnosis not present

## 2024-07-06 DIAGNOSIS — Z23 Encounter for immunization: Secondary | ICD-10-CM | POA: Diagnosis not present

## 2024-07-06 DIAGNOSIS — N1832 Chronic kidney disease, stage 3b: Secondary | ICD-10-CM | POA: Diagnosis not present

## 2024-07-06 DIAGNOSIS — N2889 Other specified disorders of kidney and ureter: Secondary | ICD-10-CM | POA: Diagnosis not present

## 2024-07-12 LAB — MYELOID NGS

## 2024-07-16 DIAGNOSIS — N189 Chronic kidney disease, unspecified: Secondary | ICD-10-CM | POA: Diagnosis not present

## 2024-07-16 DIAGNOSIS — E039 Hypothyroidism, unspecified: Secondary | ICD-10-CM | POA: Diagnosis not present

## 2024-07-27 DIAGNOSIS — M810 Age-related osteoporosis without current pathological fracture: Secondary | ICD-10-CM | POA: Diagnosis not present

## 2024-07-27 DIAGNOSIS — E039 Hypothyroidism, unspecified: Secondary | ICD-10-CM | POA: Diagnosis not present

## 2024-07-30 ENCOUNTER — Inpatient Hospital Stay

## 2024-07-30 ENCOUNTER — Inpatient Hospital Stay: Admitting: Oncology

## 2024-08-05 ENCOUNTER — Inpatient Hospital Stay: Attending: Oncology | Admitting: Oncology

## 2024-08-05 ENCOUNTER — Encounter: Payer: Self-pay | Admitting: Oncology
